# Patient Record
Sex: Male | Born: 1950 | Race: White | Hispanic: No | Marital: Married | Smoking: Never smoker
Health system: Southern US, Community
[De-identification: ages and names within clinical notes are randomized; demographics above are authoritative.]

## PROBLEM LIST (undated history)

## (undated) ENCOUNTER — Emergency Department (HOSPITAL_COMMUNITY): Admission: EM | Source: Home / Self Care

## (undated) DIAGNOSIS — E669 Obesity, unspecified: Secondary | ICD-10-CM

## (undated) DIAGNOSIS — E785 Hyperlipidemia, unspecified: Secondary | ICD-10-CM

## (undated) DIAGNOSIS — M1990 Unspecified osteoarthritis, unspecified site: Secondary | ICD-10-CM

## (undated) DIAGNOSIS — F32A Depression, unspecified: Secondary | ICD-10-CM

## (undated) DIAGNOSIS — E119 Type 2 diabetes mellitus without complications: Secondary | ICD-10-CM

## (undated) DIAGNOSIS — J189 Pneumonia, unspecified organism: Secondary | ICD-10-CM

## (undated) DIAGNOSIS — F419 Anxiety disorder, unspecified: Secondary | ICD-10-CM

## (undated) DIAGNOSIS — K219 Gastro-esophageal reflux disease without esophagitis: Secondary | ICD-10-CM

## (undated) DIAGNOSIS — G4730 Sleep apnea, unspecified: Secondary | ICD-10-CM

## (undated) DIAGNOSIS — I10 Essential (primary) hypertension: Secondary | ICD-10-CM

## (undated) DIAGNOSIS — M109 Gout, unspecified: Secondary | ICD-10-CM

## (undated) DIAGNOSIS — Z87442 Personal history of urinary calculi: Secondary | ICD-10-CM

## (undated) HISTORY — PX: FRACTURE SURGERY: SHX138

## (undated) HISTORY — PX: WISDOM TOOTH EXTRACTION: SHX21

## (undated) HISTORY — PX: GANGLION CYST EXCISION: SHX1691

---

## 2008-09-23 HISTORY — PX: FRACTURE SURGERY: SHX138

## 2016-02-02 ENCOUNTER — Encounter (HOSPITAL_COMMUNITY): Payer: Self-pay | Admitting: Emergency Medicine

## 2016-02-02 ENCOUNTER — Emergency Department (HOSPITAL_COMMUNITY): Payer: BLUE CROSS/BLUE SHIELD

## 2016-02-02 ENCOUNTER — Emergency Department (HOSPITAL_BASED_OUTPATIENT_CLINIC_OR_DEPARTMENT_OTHER): Payer: BLUE CROSS/BLUE SHIELD

## 2016-02-02 ENCOUNTER — Observation Stay (HOSPITAL_COMMUNITY)
Admission: EM | Admit: 2016-02-02 | Discharge: 2016-02-03 | Disposition: A | Discharge status: Home / Self Care | Payer: BLUE CROSS/BLUE SHIELD | Attending: Internal Medicine | Admitting: Internal Medicine

## 2016-02-02 DIAGNOSIS — Z79899 Other long term (current) drug therapy: Secondary | ICD-10-CM | POA: Diagnosis not present

## 2016-02-02 DIAGNOSIS — Z7982 Long term (current) use of aspirin: Secondary | ICD-10-CM | POA: Diagnosis not present

## 2016-02-02 DIAGNOSIS — R079 Chest pain, unspecified: Secondary | ICD-10-CM

## 2016-02-02 DIAGNOSIS — E669 Obesity, unspecified: Secondary | ICD-10-CM | POA: Insufficient documentation

## 2016-02-02 DIAGNOSIS — R0789 Other chest pain: Secondary | ICD-10-CM | POA: Diagnosis not present

## 2016-02-02 DIAGNOSIS — E119 Type 2 diabetes mellitus without complications: Secondary | ICD-10-CM | POA: Diagnosis not present

## 2016-02-02 DIAGNOSIS — G4730 Sleep apnea, unspecified: Secondary | ICD-10-CM | POA: Diagnosis present

## 2016-02-02 DIAGNOSIS — I10 Essential (primary) hypertension: Secondary | ICD-10-CM | POA: Diagnosis present

## 2016-02-02 DIAGNOSIS — E785 Hyperlipidemia, unspecified: Secondary | ICD-10-CM | POA: Diagnosis not present

## 2016-02-02 DIAGNOSIS — E1165 Type 2 diabetes mellitus with hyperglycemia: Secondary | ICD-10-CM | POA: Diagnosis present

## 2016-02-02 DIAGNOSIS — Z7984 Long term (current) use of oral hypoglycemic drugs: Secondary | ICD-10-CM | POA: Diagnosis not present

## 2016-02-02 DIAGNOSIS — IMO0002 Reserved for concepts with insufficient information to code with codable children: Secondary | ICD-10-CM | POA: Diagnosis present

## 2016-02-02 DIAGNOSIS — G4733 Obstructive sleep apnea (adult) (pediatric): Secondary | ICD-10-CM | POA: Diagnosis present

## 2016-02-02 HISTORY — DX: Sleep apnea, unspecified: G47.30

## 2016-02-02 HISTORY — DX: Type 2 diabetes mellitus without complications: E11.9

## 2016-02-02 HISTORY — DX: Essential (primary) hypertension: I10

## 2016-02-02 HISTORY — DX: Hyperlipidemia, unspecified: E78.5

## 2016-02-02 HISTORY — DX: Obesity, unspecified: E66.9

## 2016-02-02 LAB — BASIC METABOLIC PANEL
Anion gap: 7 (ref 5–15)
BUN: 28 mg/dL — AB (ref 6–20)
CO2: 27 mmol/L (ref 22–32)
Calcium: 9.2 mg/dL (ref 8.9–10.3)
Chloride: 104 mmol/L (ref 101–111)
Creatinine, Ser: 0.91 mg/dL (ref 0.61–1.24)
GFR calc Af Amer: >60 mL/min (ref >60)
GLUCOSE: 144 mg/dL — AB (ref 65–99)
POTASSIUM: 3.7 mmol/L (ref 3.5–5.1)
Sodium: 138 mmol/L (ref 135–145)

## 2016-02-02 LAB — CBC
HCT: 42.2 % (ref 39.0–52.0)
Hemoglobin: 13.7 g/dL (ref 13.0–17.0)
MCH: 30.3 pg (ref 26.0–34.0)
MCHC: 32.5 g/dL (ref 30.0–36.0)
MCV: 93.4 fL (ref 78.0–100.0)
Platelets: 177 10*3/uL (ref 150–400)
RBC: 4.52 MIL/uL (ref 4.22–5.81)
RDW: 13.3 % (ref 11.5–15.5)
WBC: 8 10*3/uL (ref 4.0–10.5)

## 2016-02-02 LAB — TROPONIN I: Troponin I: <0.03 ng/mL (ref <0.031)

## 2016-02-02 LAB — ECHOCARDIOGRAM COMPLETE
Height: 68 in
Weight: 4320 oz

## 2016-02-02 LAB — BRAIN NATRIURETIC PEPTIDE: B NATRIURETIC PEPTIDE 5: 9 pg/mL (ref 0.0–100.0)

## 2016-02-02 MED ORDER — NITROGLYCERIN 0.4 MG SL SUBL
SUBLINGUAL_TABLET | SUBLINGUAL | Status: AC
  Filled 2016-02-02: qty 1

## 2016-02-02 MED ORDER — ROSUVASTATIN CALCIUM 20 MG PO TABS
20.0000 mg | ORAL_TABLET | Freq: Every day | ORAL | Status: DC
  Administered 2016-02-02: 20 mg via ORAL
  Filled 2016-02-02 (×2): qty 1

## 2016-02-02 MED ORDER — OMEGA-3-ACID ETHYL ESTERS 1 G PO CAPS
1.0000 g | ORAL_CAPSULE | Freq: Two times a day (BID) | ORAL | Status: DC
  Administered 2016-02-02 – 2016-02-03 (×2): 1 g via ORAL
  Filled 2016-02-02 (×2): qty 1

## 2016-02-02 MED ORDER — ASPIRIN 81 MG PO CHEW
324.0000 mg | CHEWABLE_TABLET | Freq: Once | ORAL | Status: DC
  Filled 2016-02-02: qty 4

## 2016-02-02 MED ORDER — LISINOPRIL 10 MG PO TABS
40.0000 mg | ORAL_TABLET | Freq: Every day | ORAL | Status: DC
  Administered 2016-02-02 – 2016-02-03 (×2): 40 mg via ORAL
  Filled 2016-02-02 (×2): qty 4

## 2016-02-02 MED ORDER — SODIUM CHLORIDE 0.9% FLUSH
3.0000 mL | Freq: Two times a day (BID) | INTRAVENOUS | Status: DC
  Administered 2016-02-02 – 2016-02-03 (×3): 3 mL via INTRAVENOUS

## 2016-02-02 MED ORDER — ACETAMINOPHEN 325 MG PO TABS: 650.0000 mg | ORAL_TABLET | Freq: Four times a day (QID) | ORAL | Status: DC | PRN

## 2016-02-02 MED ORDER — HEPARIN SODIUM (PORCINE) 5000 UNIT/ML IJ SOLN
5000.0000 [IU] | Freq: Three times a day (TID) | INTRAMUSCULAR | Status: DC
  Administered 2016-02-02 – 2016-02-03 (×2): 5000 [IU] via SUBCUTANEOUS
  Filled 2016-02-02 (×2): qty 1

## 2016-02-02 MED ORDER — MORPHINE SULFATE (PF) 2 MG/ML IV SOLN: 2.0000 mg | INTRAVENOUS | Status: DC | PRN

## 2016-02-02 MED ORDER — SAW PALMETTO 450 MG PO CAPS: 1.0000 | ORAL_CAPSULE | Freq: Two times a day (BID) | ORAL | Status: DC

## 2016-02-02 MED ORDER — METFORMIN HCL 500 MG PO TABS
500.0000 mg | ORAL_TABLET | Freq: Two times a day (BID) | ORAL | Status: DC
  Administered 2016-02-03: 500 mg via ORAL
  Filled 2016-02-02: qty 1

## 2016-02-02 MED ORDER — ASPIRIN 81 MG PO CHEW
243.0000 mg | CHEWABLE_TABLET | Freq: Once | ORAL | Status: AC
  Administered 2016-02-02: 243 mg via ORAL

## 2016-02-02 MED ORDER — NITROGLYCERIN 0.4 MG SL SUBL
0.4000 mg | SUBLINGUAL_TABLET | Freq: Once | SUBLINGUAL | Status: AC
  Administered 2016-02-02: 0.4 mg via SUBLINGUAL

## 2016-02-02 MED ORDER — ASPIRIN EC 81 MG PO TBEC
81.0000 mg | DELAYED_RELEASE_TABLET | Freq: Two times a day (BID) | ORAL | Status: DC
  Administered 2016-02-02 – 2016-02-03 (×2): 81 mg via ORAL
  Filled 2016-02-02 (×2): qty 1

## 2016-02-02 MED ORDER — GI COCKTAIL ~~LOC~~
30.0000 mL | Freq: Once | ORAL | Status: AC
  Administered 2016-02-02: 30 mL via ORAL
  Filled 2016-02-02: qty 30

## 2016-02-02 MED ORDER — LINAGLIPTIN 5 MG PO TABS
5.0000 mg | ORAL_TABLET | Freq: Every day | ORAL | Status: DC
  Administered 2016-02-03: 5 mg via ORAL
  Filled 2016-02-02: qty 1

## 2016-02-02 MED ORDER — ACETAMINOPHEN 650 MG RE SUPP
650.0000 mg | Freq: Four times a day (QID) | RECTAL | Status: DC | PRN
Start: 2016-02-02 — End: 2016-02-03
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
## 2016-02-02 NOTE — ED Notes (Signed)
Pt up ambulatory to bathroom without difficulty.  

## 2016-02-02 NOTE — ED Provider Notes (Signed)
CSN: [id number]     Arrival date & time 02/02/16  4098 History   First MD Initiated Contact with Patient 02/02/16 [phone]     Chief Complaint  Patient presents with  . Chest Pain     (Consider location/radiation/quality/duration/timing/severity/associated sxs/prior Treatment) HPI Comments: Patient with history of hypertension, high cholesterol and diabetes presenting with central chest pain that woke him from sleep at 3 AM. Pain is intermittent, dull and achy. Last for several minutes at a time and comes and goes. Does not radiate to back shoulders or neck. Denies any shortness of breath, nausea, vomiting, diaphoresis. He has never had this pain before contrary to triage note. Does not feel like acid reflux. States he's been having fatigue and dyspnea on exertion for several weeks and saw his doctor. He had a Holter monitor and carotid Dopplers that he says were normal. He is scheduled for a stress test this coming Monday in 3 days. He reports his last stress test several years ago. Denies ever having a heart attack. He is not a smoker. Denies abdominal pain, nausea vomiting or back pain. Denies any headache or fever. Had episode of shortness of breath at rest 1 week ago while driving.  This lasted about 1 minute and there was no associated Chest pain, diaphoresis, nausea, or vomiting.   The history is provided by the patient.    Past Medical History  Diagnosis Date  . Diabetes mellitus without complication (HCC)   . Hypertension    Past Surgical History  Procedure Laterality Date  . Fracture surgery     History reviewed. No pertinent family history. Social History  Substance Use Topics  . Smoking status: Never Smoker   . Smokeless tobacco: None  . Alcohol Use: No    Review of Systems  Constitutional: Positive for activity change. Negative for fever, diaphoresis and fatigue.  HENT: Negative for congestion.   Eyes: Negative for visual disturbance.  Respiratory: Negative for cough and  shortness of breath.   Cardiovascular: Positive for chest pain.  Gastrointestinal: Negative for nausea, vomiting and abdominal pain.  Genitourinary: Negative for dysuria, urgency and hematuria.  Musculoskeletal: Negative for myalgias and arthralgias.  Skin: Negative for rash.  Neurological: Negative for dizziness, weakness, light-headedness and headaches.  A complete 10 system review of systems was obtained and all systems are negative except as noted in the HPI and PMH.      Allergies  Review of patient's allergies indicates no known allergies.  Home Medications   Prior to Admission medications   Not on File   BP 180/90 mmHg  Pulse 79  Temp(Src) 98.5 F (36.9 C) (Oral)  Resp 20  Ht  (1.727 m)  Wt 270 lb (122.471 kg)  BMI 41.06 kg/m2  SpO2 94% Physical Exam  Constitutional: He is oriented to person, place, and time. He appears well-developed and well-nourished. No distress.  obese  HENT:  Head: Normocephalic and atraumatic.  Mouth/Throat: Oropharynx is clear and moist. No oropharyngeal exudate.  Eyes: Conjunctivae and EOM are normal. Pupils are equal, round, and reactive to light.  Neck: Normal range of motion. Neck supple.  No meningismus.  Cardiovascular: Normal rate, regular rhythm, normal heart sounds and intact distal pulses.   No murmur heard. Pulmonary/Chest: Effort normal and breath sounds normal. No respiratory distress.  Abdominal: Soft. There is no tenderness. There is no rebound and no guarding.  Musculoskeletal: Normal range of motion. He exhibits no edema or tenderness.  Neurological: He is alert and oriented  to person, place, and time. No cranial nerve deficit. He exhibits normal muscle tone. Coordination normal.  No ataxia on finger to nose bilaterally. No pronator drift. 5/5 strength throughout. CN 2-12 intact.Equal grip strength. Sensation intact.   Skin: Skin is warm.  Psychiatric: He has a normal mood and affect. His behavior is normal.  Nursing  note and vitals reviewed.   ED Course  Procedures (including critical care time) Labs Review Labs Reviewed  BASIC METABOLIC PANEL - Abnormal; Notable for the following:    Glucose, Bld 144 (*)    BUN 28 (*)    All other components within normal limits  CBC  TROPONIN I  BRAIN NATRIURETIC PEPTIDE  TROPONIN I  TROPONIN I  TROPONIN I  TROPONIN I    Imaging Review Dg Chest 2 View  02/02/2016  CLINICAL DATA:  Central chest pain for several hours EXAM: CHEST  2 VIEW COMPARISON:  None FINDINGS: Cardiac shadow is within normal limits. Lungs are clear bilaterally. No acute bony abnormality is seen. Previous surgical fixation of the left clavicle is noted. IMPRESSION: No active cardiopulmonary disease. Electronically Signed   By: Alcide CleverMark  Lukens M.D.   On: 02/02/2016 08:13   I have personally reviewed and evaluated these images and lab results as part of my medical decision-making.   EKG Interpretation   Date/Time:  Friday Feb 02 2016 07:22:56 EDT Ventricular Rate:  80 PR Interval:  220 QRS Duration: 85 QT Interval:  353 QTC Calculation: 407 R Axis:   28 Text Interpretation:  Sinus rhythm Prolonged PR interval wandering  baseline No previous ECGs available Confirmed by Manus GunningANCOUR  MD, Jaziya Obarr  [phone]) on 02/02/2016 7:40:31 AM      MDM   Final diagnoses:  Chest pain, unspecified chest pain type   Central chest pain since 3 am, waxing and waning.  Pain free at this time.  EKG without acute ST abnormalities.  ASA and GI cocktail given  HEART score 5.  Troponin negative. Chest x-ray negative. Patient still has intermittent pain on and off throughout his stay in the ED. Discussed with Dr. Wyline MoodBranch of cardiology. He recommends a medical admission and has seen patient. He has outpatient stress test scheduled next week.  Admission dw Dr. Conley RollsLe.  Glynn OctaveStephen Marris Frontera, MD 02/02/16 [phone]
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
## 2016-02-02 NOTE — ED Notes (Signed)
Pt states he has been having chest pain off and on for a few weeks which his doctor has been following and has ordered testing to evaluate.  Was awoken by throbbing chest pain in center of chest with shortness of breath.

## 2016-02-03 DIAGNOSIS — R079 Chest pain, unspecified: Secondary | ICD-10-CM | POA: Diagnosis not present

## 2016-02-03 DIAGNOSIS — G4730 Sleep apnea, unspecified: Secondary | ICD-10-CM

## 2016-02-03 DIAGNOSIS — E669 Obesity, unspecified: Secondary | ICD-10-CM | POA: Diagnosis not present

## 2016-02-03 LAB — TROPONIN I

## 2016-02-03 MED ORDER — LOSARTAN POTASSIUM 50 MG PO TABS: 50.0000 mg | ORAL_TABLET | Freq: Every day | ORAL | Status: DC

## 2016-02-03 MED ORDER — PANTOPRAZOLE SODIUM 40 MG PO TBEC: 40.0000 mg | DELAYED_RELEASE_TABLET | Freq: Every day | ORAL | Status: DC

## 2016-02-03 MED ORDER — PANTOPRAZOLE SODIUM 40 MG PO TBEC: 40.0000 mg | DELAYED_RELEASE_TABLET | Freq: Two times a day (BID) | ORAL | Status: DC

## 2016-02-03 NOTE — Plan of Care (Signed)
Problem: Phase II Progression Outcomes Goal: Anginal pain relieved Outcome: Progressing Denies any CP at this time. Troponin  Within normal limits

## 2016-02-03 NOTE — Discharge Summary (Signed)
Physician Discharge Summary  Noah Hampton JYN:[id number] DOB: 10-10-1950 DOA: 02/02/2016  PCP: No primary care provider on file.  Admit date: 02/02/2016 Discharge date: 02/03/2016  Time spent: 35 minutes  Recommendations for Outpatient Follow-up:  1. Follow up with our cardiologist for the stress test Monday.  2.         Follow up with your PCP as scheduled.   Discharge Diagnoses:  Principal Problem:   Chest pain with high risk of acute coronary syndrome Active Problems:   Uncontrolled hypertension   Type 2 diabetes mellitus, uncontrolled (HCC)   Dyslipidemia   Obesity-BMI 41   Sleep apnea-on C-pap   Chest pain   Atypical chest pain   Discharge Condition: improved.  No further chest pain.   Diet recommendation: Cardiac and carb modified diet.   Filed Weights   02/02/16 0724  Weight: 122.471 kg (270 lb)    History of present illness: Patient was admitted with atypical CP for r/out by me on Feb 02, 2016.  As per my prior H and P:  " Noah Hampton is an 65 y.o. male with hx of sleep apnea, HTN, DM, HLD, presented to the ER with feeling fatigue and was to have his stress test on Monday having awaken today at 3am with retrosternal CP. He did not have diaphoresis, but had some SOB. Evaluation in the ER included an EKG and troponin x 2 and were negative. His ECHO was normal as well. Cardiology was consulted, and saw him and recommended 24 hour obs and serial troponins. Hospitalist was asked to admit him for r/out. He is currently pain free. He doesn't consume alcohol, and doesn't smoke.    Hospital Course:  Patient no longer had CP after being admitted into the hospital.  His meds were continued, and his troponins were cycled.  They were all negative.  He complained that he has had chronic tickling non productive coughs, and admitted to having reflux symptomology.  Since he has been on Lisinopril, I will change him to Cozaar at 50mg  per day, in case he has suffered ACE I induced  cough.  He also will be placed on Protonix 40mg  BID take take at least 4 to 6 weeks, in case his cough is from his reflux.  He is anxious to go home, and will be discharged home today.  He was strongly encouraged to follow up with his PCP, and to proceed with his stress test.  Thank you and Good Day.   Procedures: ECHO.   Normal.   Consultations:   Cardiology:  Dr Wyline MoodBranch.  Discharge Exam: Filed Vitals:   02/02/16 2100 02/03/16 0620  BP: 163/76 178/85  Pulse: 79 65  Temp: 97.6 F (36.4 C)   Resp: 17 20   Discharge Instructions   Discharge Instructions    Diet - low sodium heart healthy    Complete by:  As directed      Discharge instructions    Complete by:  As directed   Follow up with your doctor and proceed with your stress test.  Take your medicine as prescribed.     Increase activity slowly    Complete by:  As directed           Current Discharge Medication List    START taking these medications   Details  losartan (COZAAR) 50 MG tablet Take 1 tablet (50 mg total) by mouth daily. Qty: 30 tablet, Refills: 2    pantoprazole (PROTONIX) 40 MG tablet Take 1 tablet (40  mg total) by mouth 2 (two) times daily. Qty: 60 tablet, Refills: 2      CONTINUE these medications which have NOT CHANGED   Details  aspirin EC 81 MG tablet Take 81 mg by mouth 2 (two) times daily.    hydrochlorothiazide (HYDRODIURIL) 25 MG tablet Take 25 mg by mouth daily.    metFORMIN (GLUCOPHAGE) 500 MG tablet Take 500 mg by mouth 2 (two) times daily with a meal.    Multiple Vitamin (MULTIVITAMIN WITH MINERALS) TABS tablet Take 1 tablet by mouth daily.    Omega-3 Fatty Acids (FISH OIL) 1200 MG CAPS Take 2 capsules by mouth 2 (two) times daily.    rosuvastatin (CRESTOR) 20 MG tablet Take 20 mg by mouth at bedtime.    Saw Palmetto 450 MG CAPS Take 1 capsule by mouth 2 (two) times daily.    sitaGLIPtin (JANUVIA) 100 MG tablet Take 100 mg by mouth daily.      STOP taking these medications      lisinopril (PRINIVIL,ZESTRIL) 40 MG tablet      naproxen sodium (ANAPROX) 220 MG tablet        No Known Allergies    The results of significant diagnostics from this hospitalization (including imaging, microbiology, ancillary and laboratory) are listed below for reference.    Significant Diagnostic Studies: Dg Chest 2 View  02/02/2016  CLINICAL DATA:  Central chest pain for several hours EXAM: CHEST  2 VIEW COMPARISON:  None FINDINGS: Cardiac shadow is within normal limits. Lungs are clear bilaterally. No acute bony abnormality is seen. Previous surgical fixation of the left clavicle is noted. IMPRESSION: No active cardiopulmonary disease. Electronically Signed   By: Alcide Clever M.D.   On: 02/02/2016 08:13    Labs: Basic Metabolic Panel:  Recent Labs Lab 02/02/16 0728  NA 138  K 3.7  CL 104  CO2 27  GLUCOSE 144*  BUN 28*  CREATININE 0.91  CALCIUM 9.2   Liver Function Tests:  CBC:  Recent Labs Lab 02/02/16 0728  WBC 8.0  HGB 13.7  HCT 42.2  MCV 93.4  PLT 177   Cardiac Enzymes:  Recent Labs Lab 02/02/16 0728 02/02/16 1013 02/02/16 1456 02/02/16 2035 02/03/16 0315  TROPONINI <0.03 <0.03 <0.03 <0.03 <0.03   BNP: BNP (last 3 results)  Recent Labs  02/02/16 0729  BNP 9.0    Signed:  Cherokee Boccio MD.  [facility] 02/03/2016, 12:00 PM

## 2016-02-03 NOTE — Progress Notes (Signed)
Patient received discharge instructions along with prescriptions and follow up appointments. Patient and wife verbalized understanding of all instructions. Patient was escorted by staff via wheelchair to vehicle . Patient discharged to home in stable condition.

## 2016-02-03 NOTE — Plan of Care (Signed)
Problem: Phase II Progression Outcomes Goal: Stress Test if indicated Outcome: Progressing Having a stress test on 02/08/16 outpatient per patient

## 2016-02-03 NOTE — Plan of Care (Signed)
Problem: Phase II Progression Outcomes Goal: Stress Test if indicated Outcome: Progressing Patient having stress test on Monday May 15

## 2016-11-13 DIAGNOSIS — M1990 Unspecified osteoarthritis, unspecified site: Secondary | ICD-10-CM | POA: Insufficient documentation

## 2016-11-13 DIAGNOSIS — R0902 Hypoxemia: Secondary | ICD-10-CM | POA: Insufficient documentation

## 2017-11-14 ENCOUNTER — Other Ambulatory Visit: Payer: Self-pay

## 2017-11-14 ENCOUNTER — Emergency Department (HOSPITAL_COMMUNITY)
Admission: EM | Admit: 2017-11-14 | Discharge: 2017-11-14 | Disposition: A | Discharge status: Home / Self Care | Payer: BLUE CROSS/BLUE SHIELD | Attending: Emergency Medicine | Admitting: Emergency Medicine

## 2017-11-14 ENCOUNTER — Encounter (HOSPITAL_COMMUNITY): Payer: Self-pay | Admitting: Emergency Medicine

## 2017-11-14 ENCOUNTER — Emergency Department (HOSPITAL_COMMUNITY): Payer: BLUE CROSS/BLUE SHIELD

## 2017-11-14 DIAGNOSIS — L03116 Cellulitis of left lower limb: Secondary | ICD-10-CM | POA: Diagnosis not present

## 2017-11-14 DIAGNOSIS — Z79899 Other long term (current) drug therapy: Secondary | ICD-10-CM | POA: Diagnosis not present

## 2017-11-14 DIAGNOSIS — Z7982 Long term (current) use of aspirin: Secondary | ICD-10-CM | POA: Diagnosis not present

## 2017-11-14 DIAGNOSIS — Z7984 Long term (current) use of oral hypoglycemic drugs: Secondary | ICD-10-CM | POA: Insufficient documentation

## 2017-11-14 DIAGNOSIS — M79605 Pain in left leg: Secondary | ICD-10-CM | POA: Diagnosis present

## 2017-11-14 DIAGNOSIS — I10 Essential (primary) hypertension: Secondary | ICD-10-CM | POA: Diagnosis not present

## 2017-11-14 DIAGNOSIS — E119 Type 2 diabetes mellitus without complications: Secondary | ICD-10-CM | POA: Insufficient documentation

## 2017-11-14 LAB — CBC WITH DIFFERENTIAL/PLATELET
Basophils Absolute: 0 10*3/uL (ref 0.0–0.1)
Basophils Relative: 0 %
EOS ABS: 0.2 10*3/uL (ref 0.0–0.7)
Eosinophils Relative: 2 %
HCT: 40.9 % (ref 39.0–52.0)
HEMOGLOBIN: 12.7 g/dL — AB (ref 13.0–17.0)
LYMPHS ABS: 1.8 10*3/uL (ref 0.7–4.0)
Lymphocytes Relative: 15 %
MCH: 29.4 pg (ref 26.0–34.0)
MCHC: 31.1 g/dL (ref 30.0–36.0)
MCV: 94.7 fL (ref 78.0–100.0)
Monocytes Absolute: 1.5 10*3/uL — ABNORMAL HIGH (ref 0.1–1.0)
Monocytes Relative: 12 %
NEUTROS ABS: 8.6 10*3/uL — AB (ref 1.7–7.7)
NEUTROS PCT: 71 %
Platelets: 172 10*3/uL (ref 150–400)
RBC: 4.32 MIL/uL (ref 4.22–5.81)
RDW: 13.3 % (ref 11.5–15.5)
WBC: 12.1 10*3/uL — AB (ref 4.0–10.5)

## 2017-11-14 LAB — BASIC METABOLIC PANEL
Anion gap: 12 (ref 5–15)
BUN: 21 mg/dL — AB (ref 6–20)
CHLORIDE: 100 mmol/L — AB (ref 101–111)
CO2: 28 mmol/L (ref 22–32)
Calcium: 9.5 mg/dL (ref 8.9–10.3)
Creatinine, Ser: 0.94 mg/dL (ref 0.61–1.24)
GFR calc non Af Amer: >60 mL/min (ref >60)
Glucose, Bld: 141 mg/dL — ABNORMAL HIGH (ref 65–99)
POTASSIUM: 3.7 mmol/L (ref 3.5–5.1)
SODIUM: 140 mmol/L (ref 135–145)

## 2017-11-14 LAB — CBG MONITORING, ED: Glucose-Capillary: 143 mg/dL — ABNORMAL HIGH (ref 65–99)

## 2017-11-14 MED ORDER — CEPHALEXIN 500 MG PO CAPS: 500.0000 mg | ORAL_CAPSULE | Freq: Four times a day (QID) | ORAL | 0 refills | Status: DC

## 2017-11-14 NOTE — ED Triage Notes (Signed)
Redness and swelling noted to left lower leg. Denies any increased SOB. Nad.

## 2017-11-14 NOTE — ED Notes (Signed)
Pt.'s CBG 143
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
## 2019-01-07 IMAGING — US US EXTREM LOW VENOUS*L*
1 series · 14 of 24 positions shown · non-contrast
Comparison: None

CLINICAL DATA: Swelling, pain, redness

EXAM:
LEFT LOWER EXTREMITY VENOUS DOPPLER ULTRASOUND
TECHNIQUE: Gray-scale sonography with compression, as well as color and duplex
ultrasound, were performed to evaluate the deep venous system from
the level of the common femoral vein through the popliteal and
proximal calf veins.

[Series 1: us extrem low venous*left* · 0.09mm/px · 14 of 37 slices shown]
[im 1/37]
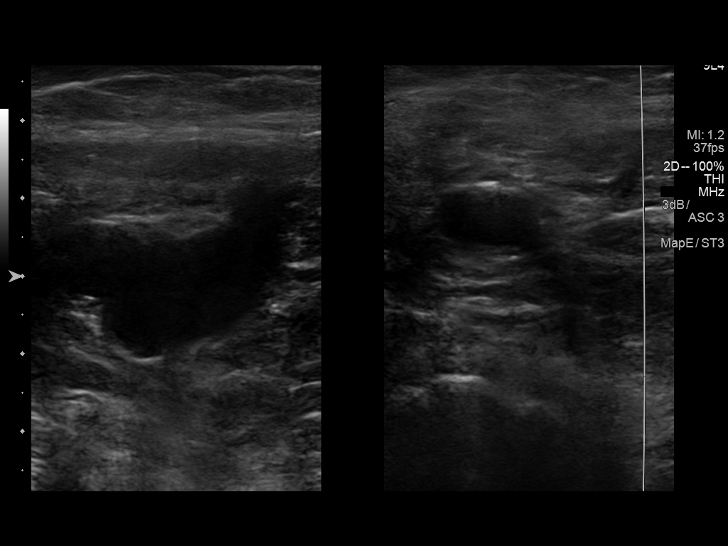
[im 4/37]
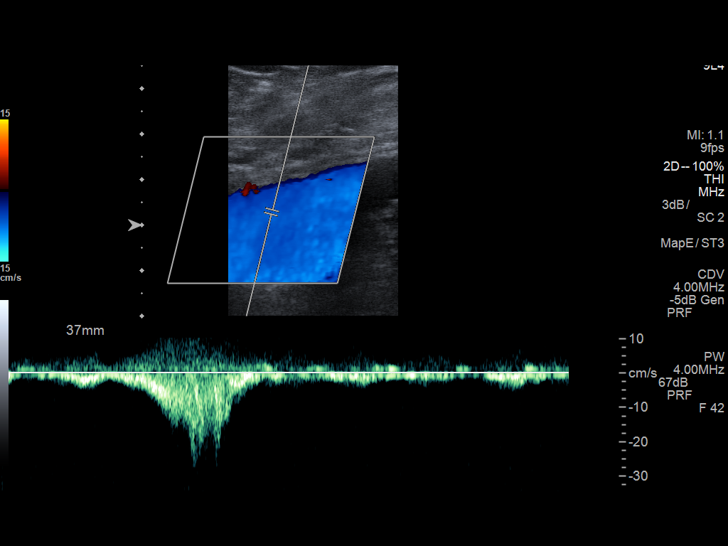
[im 7/37]
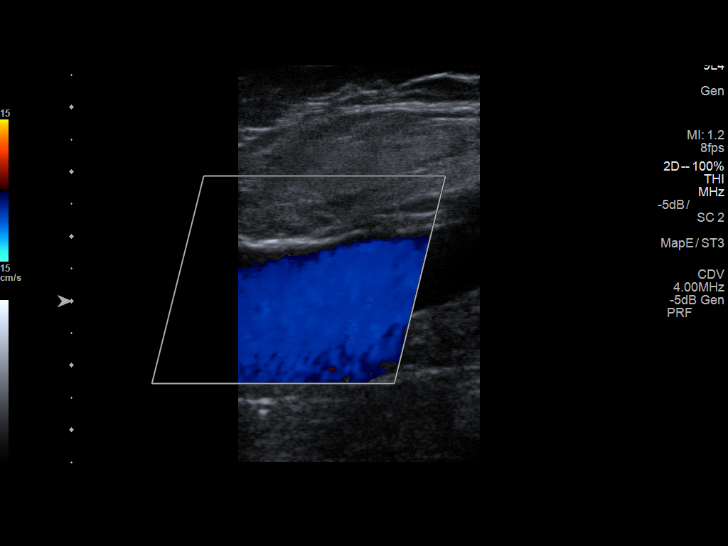
[im 10/37]
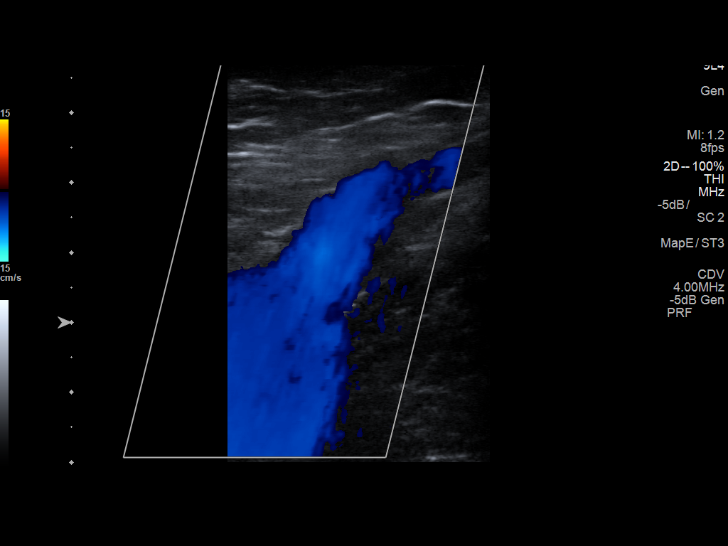
[im 11/37]
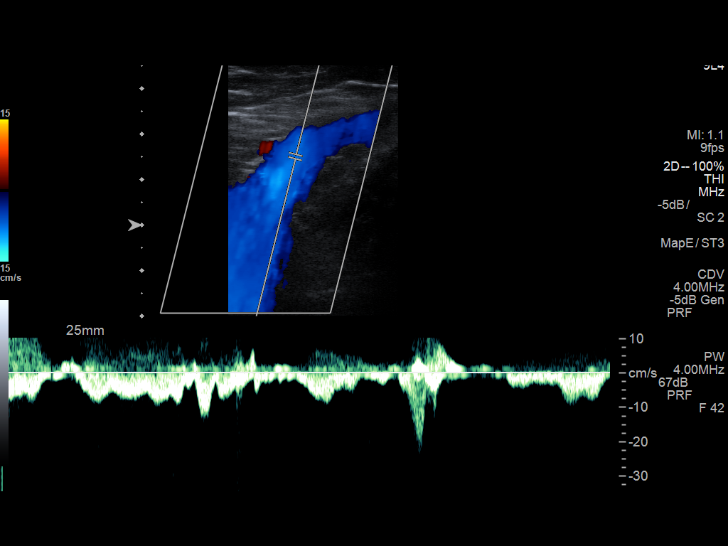
[im 15/37]
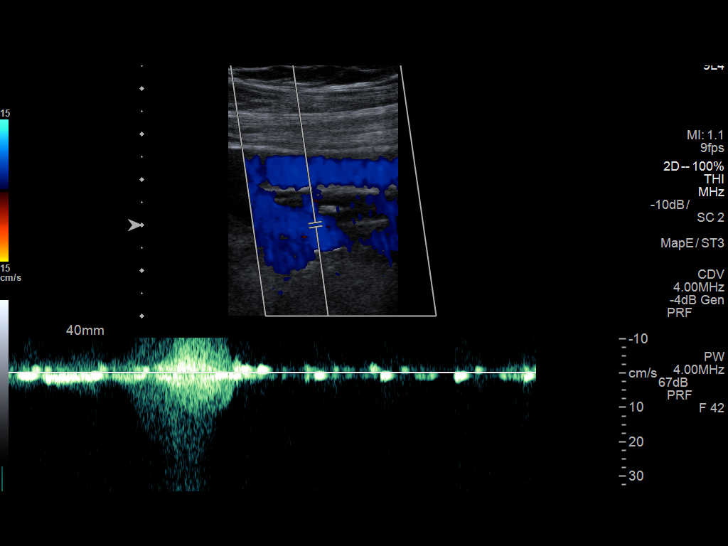
[im 18/37]
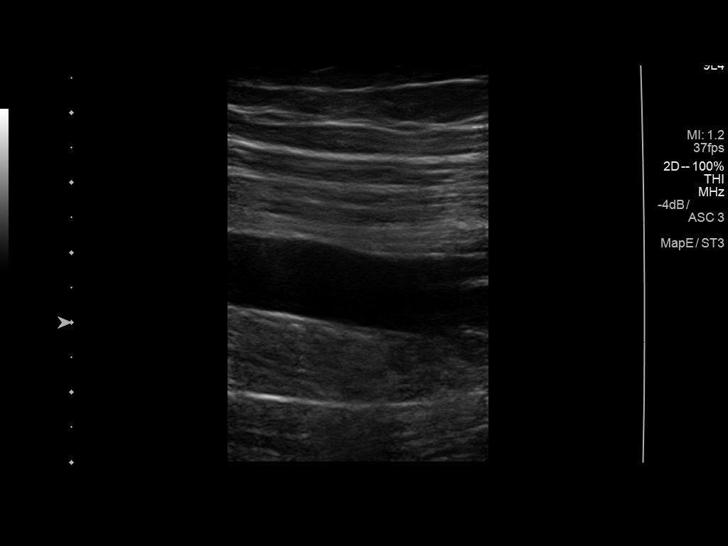
[im 19/37]
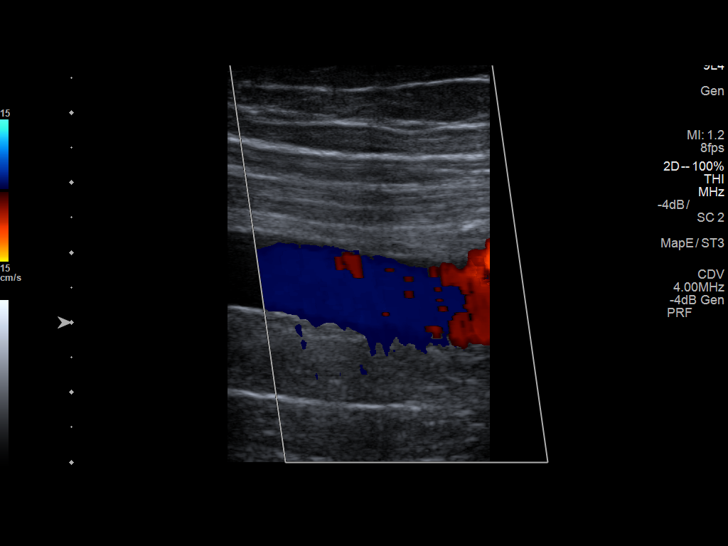
[im 22/37]
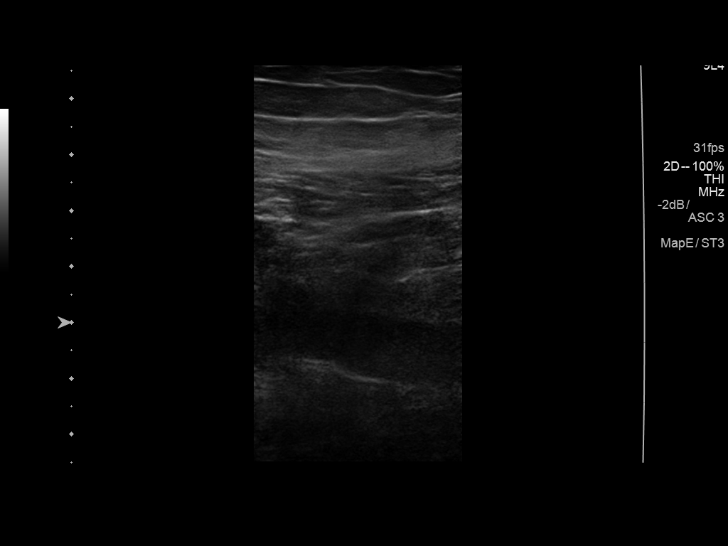
[im 26/37]
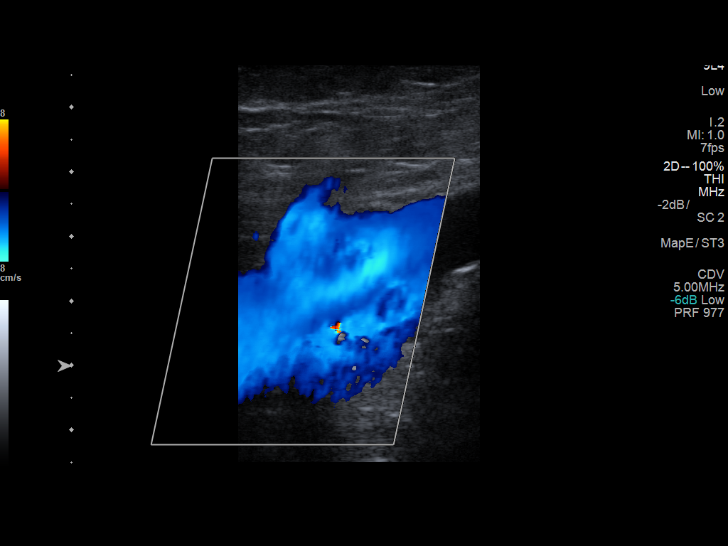
[im 29/37]
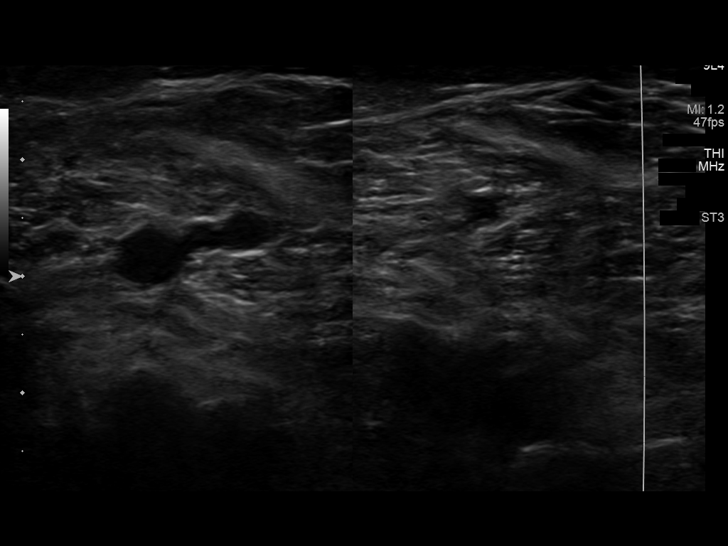
[im 30/37]
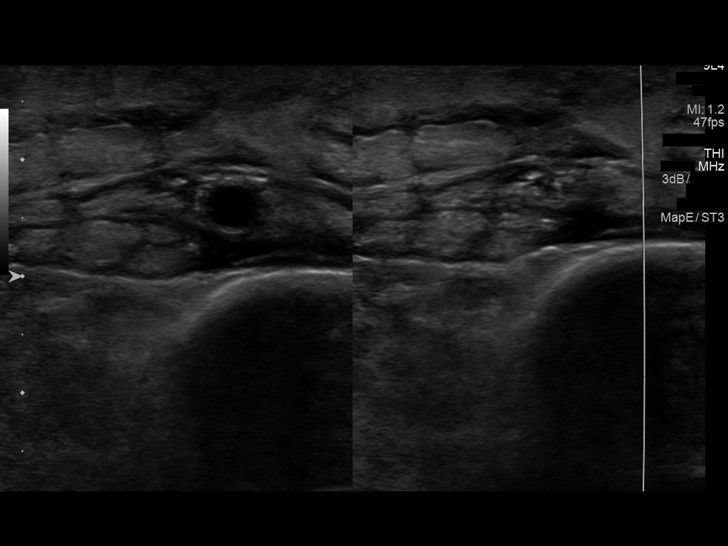
[im 33/37]
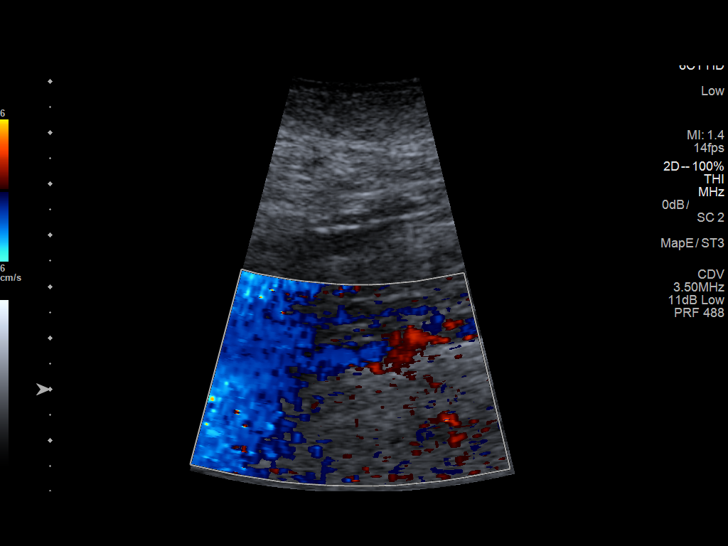
[im 37/37]
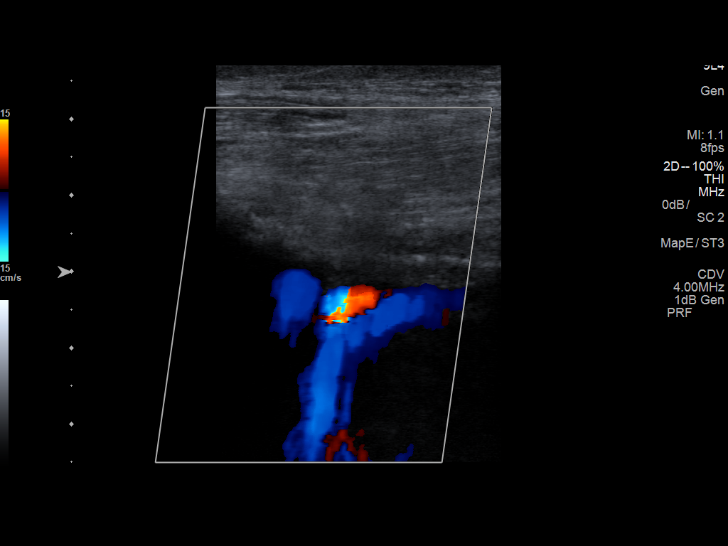

[14 of 24 positions shown; findings below may reference images not displayed]

FINDINGS: Normal compressibility of the common femoral, superficial femoral,
and popliteal veins, as well as the proximal calf veins. No filling
defects to suggest DVT on grayscale or color Doppler imaging.
Doppler waveforms show normal direction of venous flow, normal
respiratory phasicity and response to augmentation. Survey views of
the contralateral common femoral vein are unremarkable.
IMPRESSION: No evidence of LEFT lower extremity deep vein thrombosis.

## 2019-08-03 DIAGNOSIS — R1011 Right upper quadrant pain: Secondary | ICD-10-CM | POA: Insufficient documentation

## 2020-02-10 DIAGNOSIS — R7989 Other specified abnormal findings of blood chemistry: Secondary | ICD-10-CM | POA: Insufficient documentation

## 2020-06-18 DIAGNOSIS — K859 Acute pancreatitis without necrosis or infection, unspecified: Secondary | ICD-10-CM | POA: Insufficient documentation

## 2020-06-20 DIAGNOSIS — R42 Dizziness and giddiness: Secondary | ICD-10-CM | POA: Insufficient documentation

## 2020-10-22 DIAGNOSIS — R350 Frequency of micturition: Secondary | ICD-10-CM | POA: Insufficient documentation

## 2020-10-22 DIAGNOSIS — R55 Syncope and collapse: Secondary | ICD-10-CM | POA: Insufficient documentation

## 2020-10-22 DIAGNOSIS — F331 Major depressive disorder, recurrent, moderate: Secondary | ICD-10-CM | POA: Insufficient documentation

## 2020-10-24 DIAGNOSIS — K760 Fatty (change of) liver, not elsewhere classified: Secondary | ICD-10-CM | POA: Insufficient documentation

## 2020-10-24 DIAGNOSIS — Z8719 Personal history of other diseases of the digestive system: Secondary | ICD-10-CM | POA: Insufficient documentation

## 2021-01-27 DIAGNOSIS — M79671 Pain in right foot: Secondary | ICD-10-CM | POA: Insufficient documentation

## 2021-08-26 DIAGNOSIS — M545 Low back pain, unspecified: Secondary | ICD-10-CM | POA: Insufficient documentation

## 2021-10-15 ENCOUNTER — Other Ambulatory Visit (HOSPITAL_COMMUNITY): Payer: Self-pay | Admitting: Physician Assistant

## 2021-10-15 ENCOUNTER — Other Ambulatory Visit: Payer: Self-pay | Admitting: Physician Assistant

## 2021-10-15 DIAGNOSIS — Z86711 Personal history of pulmonary embolism: Secondary | ICD-10-CM

## 2021-10-15 DIAGNOSIS — R0600 Dyspnea, unspecified: Secondary | ICD-10-CM

## 2021-11-08 ENCOUNTER — Other Ambulatory Visit: Payer: Self-pay | Admitting: Physician Assistant

## 2021-11-08 ENCOUNTER — Other Ambulatory Visit (HOSPITAL_COMMUNITY): Payer: Self-pay | Admitting: Physician Assistant

## 2021-11-08 DIAGNOSIS — R1900 Intra-abdominal and pelvic swelling, mass and lump, unspecified site: Secondary | ICD-10-CM

## 2021-11-09 ENCOUNTER — Ambulatory Visit (HOSPITAL_COMMUNITY): Payer: BLUE CROSS/BLUE SHIELD

## 2021-11-12 ENCOUNTER — Encounter (HOSPITAL_COMMUNITY): Payer: Self-pay

## 2021-11-12 ENCOUNTER — Ambulatory Visit (HOSPITAL_COMMUNITY): Payer: BC Managed Care – PPO

## 2021-11-12 ENCOUNTER — Ambulatory Visit (HOSPITAL_COMMUNITY): Admission: RE | Admit: 2021-11-12 | Payer: BC Managed Care – PPO | Source: Ambulatory Visit

## 2021-11-29 ENCOUNTER — Other Ambulatory Visit (HOSPITAL_COMMUNITY): Payer: Self-pay | Admitting: Physician Assistant

## 2021-11-29 DIAGNOSIS — K861 Other chronic pancreatitis: Secondary | ICD-10-CM

## 2021-11-29 DIAGNOSIS — R109 Unspecified abdominal pain: Secondary | ICD-10-CM

## 2022-03-07 NOTE — Patient Instructions (Addendum)
DUE TO COVID-19 ONLY TWO VISITORS  (aged 72 and older)  ARE ALLOWED TO COME WITH YOU AND STAY IN THE WAITING ROOM ONLY DURING PRE OP AND PROCEDURE.   **NO VISITORS ARE ALLOWED IN THE SHORT STAY AREA OR RECOVERY ROOM!!**  IF YOU WILL BE ADMITTED INTO THE HOSPITAL YOU ARE ALLOWED ONLY FOUR SUPPORT PEOPLE DURING VISITATION HOURS ONLY (7 AM -8PM)   The support person(s) must pass our screening, gel in and out, and wear a mask at all times, including in the patient's room. Patients must also wear a mask when staff or their support person are in the room. Visitors GUEST BADGE MUST BE WORN VISIBLY  One adult visitor may remain with you overnight and MUST be in the room by 8 P.M.     Your procedure is scheduled on: 03/19/22   Report to [facility] Main Entrance    Report to admitting at 10:30 AM   Call this number if you have problems the morning of surgery [phone]   Do not eat food :After Midnight.   After Midnight you may have the following liquids until 9:55 AM DAY OF SURGERY  Water Black Coffee (sugar ok, NO MILK/CREAM OR CREAMERS)  Tea (sugar ok, NO MILK/CREAM OR CREAMERS) regular and decaf                             Plain Jell-O (NO RED)                                           Fruit ices (not with fruit pulp, NO RED)                                     Popsicles (NO RED)                                                                  Juice: apple, WHITE grape, WHITE cranberry Sports drinks like Gatorade (NO RED) Clear broth(vegetable,chicken,beef)    The day of surgery:  Drink ONE (1) Pre-Surgery G2 at 9:55 AM the morning of surgery. Drink in one sitting. Do not sip.  This drink was given to you during your hospital  pre-op appointment visit. Nothing else to drink after completing the  Pre-Surgery G2.          If you have questions, please contact your surgeon's office.   FOLLOW BOWEL PREP AND ANY ADDITIONAL PRE OP INSTRUCTIONS YOU RECEIVED FROM YOUR SURGEON'S  OFFICE!!!     Oral Hygiene is also important to reduce your risk of infection.                                    Remember - BRUSH YOUR TEETH THE MORNING OF SURGERY WITH YOUR REGULAR TOOTHPASTE   Take these medicines the morning of surgery with A SIP OF WATER: Inhalers, Allopurinol, Lexapro  DO NOT TAKE ANY ORAL DIABETIC MEDICATIONS DAY OF YOUR SURGERY  How to Manage Your Diabetes Before and After Surgery  Why is it important to control my blood sugar before and after surgery? Improving blood sugar levels before and after surgery helps healing and can limit problems. A way of improving blood sugar control is eating a healthy diet by:  Eating less sugar and carbohydrates  Increasing activity/exercise  Talking with your doctor about reaching your blood sugar goals High blood sugars (greater than 180 mg/dL) can raise your risk of infections and slow your recovery, so you will need to focus on controlling your diabetes during the weeks before surgery. Make sure that the doctor who takes care of your diabetes knows about your planned surgery including the date and location.  How do I manage my blood sugar before surgery? Check your blood sugar at least 4 times a day, starting 2 days before surgery, to make sure that the level is not too high or low. Check your blood sugar the morning of your surgery when you wake up and every 2 hours until you get to the Short Stay unit. If your blood sugar is less than 70 mg/dL, you will need to treat for low blood sugar: Do not take insulin. Treat a low blood sugar (less than 70 mg/dL) with  cup of clear juice (cranberry or apple), 4 glucose tablets, OR glucose gel. Recheck blood sugar in 15 minutes after treatment (to make sure it is greater than 70 mg/dL). If your blood sugar is not greater than 70 mg/dL on recheck, call [phone] for further instructions. Report your blood sugar to the short stay nurse when you get to Short Stay.  If you are  admitted to the hospital after surgery: Your blood sugar will be checked by the staff and you will probably be given insulin after surgery (instead of oral diabetes medicines) to make sure you have good blood sugar levels. The goal for blood sugar control after surgery is 80-180 mg/dL.   WHAT DO I DO ABOUT MY DIABETES MEDICATION?  Do not take oral diabetes medicines (pills) the morning of surgery.  THE DAY BEFORE SURGERY, take morning dose of Glimepiride, no evening dose. Do not take [place]. Take ACTOS as prescribed       THE MORNING OF SURGERY, do not take Glimepiride, Marcelline Deist, or ACTOS   Reviewed and Endorsed by [facility] Patient Education Committee, August 2015   Bring CPAP mask and tubing day of surgery.                              You may not have any metal on your body including jewelry, and body piercing             Do not wear lotions, powders, cologne, or deodorant              Men may shave face and neck.   Do not bring valuables to the hospital. [place] IS NOT             RESPONSIBLE   FOR VALUABLES.   Bring small overnight bag day of surgery.   DO NOT BRING YOUR HOME MEDICATIONS TO THE HOSPITAL. PHARMACY WILL DISPENSE MEDICATIONS LISTED ON YOUR MEDICATION LIST TO YOU DURING YOUR ADMISSION IN THE HOSPITAL!              Please read over the following fact sheets you were given: IF YOU HAVE QUESTIONS ABOUT YOUR PRE-OP INSTRUCTIONS PLEASE CALL [phone]-  [facility] - Preparing for Surgery Before surgery, you can play an important role.  Because skin is not sterile, your skin needs to be as free of germs as possible.  You can reduce the number of germs on your skin by washing with CHG (chlorahexidine gluconate) soap before surgery.  CHG is an antiseptic cleaner which kills germs and bonds with the skin to continue killing germs even after washing. Please DO NOT use if you have an allergy to CHG or antibacterial soaps.  If your skin becomes  reddened/irritated stop using the CHG and inform your nurse when you arrive at Short Stay. Do not shave (including legs and underarms) for at least 48 hours prior to the first CHG shower.  You may shave your face/neck.  Please follow these instructions carefully:  1.  Shower with CHG Soap the night before surgery and the  morning of surgery.  2.  If you choose to wash your hair, wash your hair first as usual with your normal  shampoo.  3.  After you shampoo, rinse your hair and body thoroughly to remove the shampoo.                             4.  Use CHG as you would any other liquid soap.  You can apply chg directly to the skin and wash.  Gently with a scrungie or clean washcloth.  5.  Apply the CHG Soap to your body ONLY FROM THE NECK DOWN.   Do   not use on face/ open                           Wound or open sores. Avoid contact with eyes, ears mouth and   genitals (private parts).                       Wash face,  Genitals (private parts) with your normal soap.             6.  Wash thoroughly, paying special attention to the area where your    surgery  will be performed.  7.  Thoroughly rinse your body with warm water from the neck down.  8.  DO NOT shower/wash with your normal soap after using and rinsing off the CHG Soap.                9.  Pat yourself dry with a clean towel.            10.  Wear clean pajamas.            11.  Place clean sheets on your bed the night of your first shower and do not  sleep with pets. Day of Surgery : Do not apply any lotions/deodorants the morning of surgery.  Please wear clean clothes to the hospital/surgery center.  FAILURE TO FOLLOW THESE INSTRUCTIONS MAY RESULT IN THE CANCELLATION OF YOUR SURGERY  PATIENT SIGNATURE_________________________________  NURSE SIGNATURE__________________________________  ________________________________________________________________________       Noah Hampton  An incentive spirometer is a tool that can  help keep your lungs clear and active. This tool measures how well you are filling your lungs with each breath. Taking long deep breaths may help reverse or decrease the chance of developing breathing (pulmonary) problems (especially infection) following: A long period of time when you are unable to move or be  active. BEFORE THE PROCEDURE  If the spirometer includes an indicator to show your best effort, your nurse or respiratory therapist will set it to a desired goal. If possible, sit up straight or lean slightly forward. Try not to slouch. Hold the incentive spirometer in an upright position. INSTRUCTIONS FOR USE  Sit on the edge of your bed if possible, or sit up as far as you can in bed or on a chair. Hold the incentive spirometer in an upright position. Breathe out normally. Place the mouthpiece in your mouth and seal your lips tightly around it. Breathe in slowly and as deeply as possible, raising the piston or the ball toward the top of the column. Hold your breath for 3-5 seconds or for as long as possible. Allow the piston or ball to fall to the bottom of the column. Remove the mouthpiece from your mouth and breathe out normally. Rest for a few seconds and repeat Steps 1 through 7 at least 10 times every 1-2 hours when you are awake. Take your time and take a few normal breaths between deep breaths. The spirometer may include an indicator to show your best effort. Use the indicator as a goal to work toward during each repetition. After each set of 10 deep breaths, practice coughing to be sure your lungs are clear. If you have an incision (the cut made at the time of surgery), support your incision when coughing by placing a pillow or rolled up towels firmly against it. Once you are able to get out of bed, walk around indoors and cough well. You may stop using the incentive spirometer when instructed by your caregiver.  RISKS AND COMPLICATIONS Take your time so you do not get dizzy or  light-headed. If you are in pain, you may need to take or ask for pain medication before doing incentive spirometry. It is harder to take a deep breath if you are having pain. AFTER USE Rest and breathe slowly and easily. It can be helpful to keep track of a log of your progress. Your caregiver can provide you with a simple table to help with this. If you are using the spirometer at home, follow these instructions: SEEK MEDICAL CARE IF:  You are having difficultly using the spirometer. You have trouble using the spirometer as often as instructed. Your pain medication is not giving enough relief while using the spirometer. You develop fever of 100.5 F (38.1 C) or higher. SEEK IMMEDIATE MEDICAL CARE IF:  You cough up bloody sputum that had not been present before. You develop fever of 102 F (38.9 C) or greater. You develop worsening pain at or near the incision site. MAKE SURE YOU:  Understand these instructions. Will watch your condition. Will get help right away if you are not doing well or get worse. Document Released: 01/20/2007 Document Revised: 12/02/2011 Document Reviewed: 03/23/2007 ExitCare Patient Information 2014 ExitCare, [place].   ________________________________________________________________________  WHAT IS A BLOOD TRANSFUSION? Blood Transfusion Information  A transfusion is the replacement of blood or some of its parts. Blood is made up of multiple cells which provide different functions. Red blood cells carry oxygen and are used for blood loss replacement. White blood cells fight against infection. Platelets control bleeding. Plasma helps clot blood. Other blood products are available for specialized needs, such as hemophilia or other clotting disorders. BEFORE THE TRANSFUSION  Who gives blood for transfusions?  Healthy volunteers who are fully evaluated to make sure their blood is safe. This is blood  bank blood. Transfusion therapy is the safest it has ever  been in the practice of medicine. Before blood is taken from a donor, a complete history is taken to make sure that person has no history of diseases nor engages in risky social behavior (examples are intravenous drug use or sexual activity with multiple partners). The donor's travel history is screened to minimize risk of transmitting infections, such as malaria. The donated blood is tested for signs of infectious diseases, such as HIV and hepatitis. The blood is then tested to be sure it is compatible with you in order to minimize the chance of a transfusion reaction. If you or a relative donates blood, this is often done in anticipation of surgery and is not appropriate for emergency situations. It takes many days to process the donated blood. RISKS AND COMPLICATIONS Although transfusion therapy is very safe and saves many lives, the main dangers of transfusion include:  Getting an infectious disease. Developing a transfusion reaction. This is an allergic reaction to something in the blood you were given. Every precaution is taken to prevent this. The decision to have a blood transfusion has been considered carefully by your caregiver before blood is given. Blood is not given unless the benefits outweigh the risks. AFTER THE TRANSFUSION Right after receiving a blood transfusion, you will usually feel much better and more energetic. This is especially true if your red blood cells have gotten low (anemic). The transfusion raises the level of the red blood cells which carry oxygen, and this usually causes an energy increase. The nurse administering the transfusion will monitor you carefully for complications. HOME CARE INSTRUCTIONS  No special instructions are needed after a transfusion. You may find your energy is better. Speak with your caregiver about any limitations on activity for underlying diseases you may have. SEEK MEDICAL CARE IF:  Your condition is not improving after your transfusion. You  develop redness or irritation at the intravenous (IV) site. SEEK IMMEDIATE MEDICAL CARE IF:  Any of the following symptoms occur over the next 12 hours: Shaking chills. You have a temperature by mouth above 102 F (38.9 C), not controlled by medicine. Chest, back, or muscle pain. People around you feel you are not acting correctly or are confused. Shortness of breath or difficulty breathing. Dizziness and fainting. You get a rash or develop hives. You have a decrease in urine output. Your urine turns a dark color or changes to pink, red, or brown. Any of the following symptoms occur over the next 10 days: You have a temperature by mouth above 102 F (38.9 C), not controlled by medicine. Shortness of breath. Weakness after normal activity. The white part of the eye turns yellow (jaundice). You have a decrease in the amount of urine or are urinating less often. Your urine turns a dark color or changes to pink, red, or brown. Document Released: 09/06/2000 Document Revised: 12/02/2011 Document Reviewed: 04/25/2008 [facility] Patient Information 2014 [place], [place].  _______________________________________________________________________

## 2022-03-07 NOTE — Progress Notes (Addendum)
COVID Vaccine Completed: yes x2  Date of COVID positive in last 90 days:no  PCP - Ernst Breach, DO Cardiologist - Laural Golden, MD  Chest x-ray - yes PCP, req EKG - yes PCP, req Stress Test - long time per pt ECHO - 2017 Cardiac Cath - yes, around 5 years ago per pt Pacemaker/ICD device last checked: n/a Spinal Cord Stimulator:n/a  Bowel Prep - no  Sleep Study - yes, positive CPAP - yes every night   Fasting Blood Sugar - 115-120 Checks Blood Sugar multiple times a day, free style libre  Blood Thinner Instructions: Aspirin Instructions: ASA 81, hasn't taken in few weeks Last Dose:  Activity level: Can go up a flight of stairs and perform activities of daily living without stopping and without symptoms of chest pain or shortness of breath.   Anesthesia review:   Patient denies shortness of breath, fever, cough and chest pain at PAT appointment  Patient verbalized understanding of instructions that were given to them at the PAT appointment. Patient was also instructed that they will need to review over the PAT instructions again at home before surgery.

## 2022-03-08 ENCOUNTER — Encounter (HOSPITAL_COMMUNITY): Payer: Self-pay

## 2022-03-08 ENCOUNTER — Encounter (HOSPITAL_COMMUNITY)
Admission: RE | Admit: 2022-03-08 | Discharge: 2022-03-08 | Disposition: A | Discharge status: Home / Self Care | Payer: BC Managed Care – PPO | Source: Ambulatory Visit | Attending: Orthopedic Surgery | Admitting: Orthopedic Surgery

## 2022-03-08 VITALS — BP 135/75 | HR 65 | Temp 97.8°F | Resp 14 | Ht 68.0 in | Wt 245.0 lb

## 2022-03-08 DIAGNOSIS — Z01818 Encounter for other preprocedural examination: Secondary | ICD-10-CM

## 2022-03-08 DIAGNOSIS — E119 Type 2 diabetes mellitus without complications: Secondary | ICD-10-CM

## 2022-03-08 DIAGNOSIS — I10 Essential (primary) hypertension: Secondary | ICD-10-CM

## 2022-03-08 DIAGNOSIS — M1712 Unilateral primary osteoarthritis, left knee: Secondary | ICD-10-CM

## 2022-03-08 HISTORY — DX: Unspecified osteoarthritis, unspecified site: M19.90

## 2022-03-08 HISTORY — DX: Depression, unspecified: F32.A

## 2022-03-08 HISTORY — DX: Personal history of urinary calculi: Z87.442

## 2022-03-08 HISTORY — DX: Pneumonia, unspecified organism: J18.9

## 2022-03-08 HISTORY — DX: Gastro-esophageal reflux disease without esophagitis: K21.9

## 2022-03-08 HISTORY — DX: Anxiety disorder, unspecified: F41.9

## 2022-03-08 LAB — CBC
HCT: 46.2 % (ref 39.0–52.0)
Hemoglobin: 15 g/dL (ref 13.0–17.0)
MCH: 30.7 pg (ref 26.0–34.0)
MCHC: 32.5 g/dL (ref 30.0–36.0)
MCV: 94.7 fL (ref 80.0–100.0)
Platelets: 187 10*3/uL (ref 150–400)
RBC: 4.88 MIL/uL (ref 4.22–5.81)
RDW: 13.5 % (ref 11.5–15.5)
WBC: 7.9 10*3/uL (ref 4.0–10.5)
nRBC: 0 % (ref 0.0–0.2)

## 2022-03-08 LAB — SURGICAL PCR SCREEN
MRSA, PCR: NEGATIVE
Staphylococcus aureus: POSITIVE — AB

## 2022-03-08 LAB — BASIC METABOLIC PANEL
Anion gap: 7 (ref 5–15)
BUN: 25 mg/dL — ABNORMAL HIGH (ref 8–23)
CO2: 27 mmol/L (ref 22–32)
Calcium: 9.6 mg/dL (ref 8.9–10.3)
Chloride: 105 mmol/L (ref 98–111)
Creatinine, Ser: 0.91 mg/dL (ref 0.61–1.24)
GFR, Estimated: >60 mL/min (ref >60)
Glucose, Bld: 73 mg/dL (ref 70–99)
Potassium: 3.9 mmol/L (ref 3.5–5.1)
Sodium: 139 mmol/L (ref 135–145)

## 2022-03-08 LAB — GLUCOSE, CAPILLARY: Glucose-Capillary: 98 mg/dL (ref 70–99)

## 2022-03-08 LAB — HEMOGLOBIN A1C
Hgb A1c MFr Bld: 6.8 % — ABNORMAL HIGH (ref 4.8–5.6)
Mean Plasma Glucose: 148.46 mg/dL

## 2022-03-08 NOTE — Progress Notes (Signed)
[facility]+ results routed to Dr. Olin. 

## 2022-03-09 LAB — TYPE AND SCREEN
ABO/RH(D): O POS
Antibody Screen: NEGATIVE

## 2022-03-19 ENCOUNTER — Other Ambulatory Visit: Payer: Self-pay

## 2022-03-19 ENCOUNTER — Encounter (HOSPITAL_COMMUNITY)
Admission: RE | Disposition: A | Discharge status: Home / Self Care | Payer: Self-pay | Source: Ambulatory Visit | Attending: Orthopedic Surgery

## 2022-03-19 ENCOUNTER — Observation Stay (HOSPITAL_COMMUNITY)
Admission: RE | Admit: 2022-03-19 | Discharge: 2022-03-20 | Disposition: A | Discharge status: Home / Self Care | Payer: BC Managed Care – PPO | Source: Ambulatory Visit | Attending: Orthopedic Surgery | Admitting: Orthopedic Surgery

## 2022-03-19 ENCOUNTER — Encounter (HOSPITAL_COMMUNITY): Payer: Self-pay | Admitting: Orthopedic Surgery

## 2022-03-19 ENCOUNTER — Ambulatory Visit (HOSPITAL_COMMUNITY): Payer: BC Managed Care – PPO | Admitting: Certified Registered Nurse Anesthetist

## 2022-03-19 DIAGNOSIS — M1712 Unilateral primary osteoarthritis, left knee: Principal | ICD-10-CM | POA: Insufficient documentation

## 2022-03-19 DIAGNOSIS — Z01818 Encounter for other preprocedural examination: Secondary | ICD-10-CM

## 2022-03-19 DIAGNOSIS — I10 Essential (primary) hypertension: Secondary | ICD-10-CM | POA: Insufficient documentation

## 2022-03-19 DIAGNOSIS — Z7982 Long term (current) use of aspirin: Secondary | ICD-10-CM | POA: Insufficient documentation

## 2022-03-19 DIAGNOSIS — Z7984 Long term (current) use of oral hypoglycemic drugs: Secondary | ICD-10-CM | POA: Diagnosis not present

## 2022-03-19 DIAGNOSIS — Z79899 Other long term (current) drug therapy: Secondary | ICD-10-CM | POA: Diagnosis not present

## 2022-03-19 DIAGNOSIS — E119 Type 2 diabetes mellitus without complications: Secondary | ICD-10-CM | POA: Diagnosis not present

## 2022-03-19 DIAGNOSIS — Z96652 Presence of left artificial knee joint: Secondary | ICD-10-CM

## 2022-03-19 HISTORY — PX: TOTAL KNEE ARTHROPLASTY: SHX125

## 2022-03-19 LAB — GLUCOSE, CAPILLARY
Glucose-Capillary: 152 mg/dL — ABNORMAL HIGH (ref 70–99)
Glucose-Capillary: 130 mg/dL — ABNORMAL HIGH (ref 70–99)
Glucose-Capillary: 170 mg/dL — ABNORMAL HIGH (ref 70–99)
Glucose-Capillary: 231 mg/dL — ABNORMAL HIGH (ref 70–99)

## 2022-03-19 LAB — ABO/RH: ABO/RH(D): O POS

## 2022-03-19 SURGERY — ARTHROPLASTY, KNEE, TOTAL
Anesthesia: Spinal | Site: Knee | Laterality: Left

## 2022-03-19 MED ORDER — KETOROLAC TROMETHAMINE 30 MG/ML IJ SOLN
INTRAMUSCULAR | Status: AC
  Filled 2022-03-19: qty 1

## 2022-03-19 MED ORDER — POVIDONE-IODINE 10 % EX SWAB
2.0000 | Freq: Once | CUTANEOUS | Status: AC
  Administered 2022-03-19: 2 via TOPICAL

## 2022-03-19 MED ORDER — CELECOXIB 200 MG PO CAPS
200.0000 mg | ORAL_CAPSULE | Freq: Two times a day (BID) | ORAL | Status: DC
  Administered 2022-03-19: 200 mg via ORAL
  Filled 2022-03-19: qty 1

## 2022-03-19 MED ORDER — GLIMEPIRIDE 2 MG PO TABS
2.0000 mg | ORAL_TABLET | Freq: Every day | ORAL | Status: DC
  Administered 2022-03-20: 2 mg via ORAL
  Filled 2022-03-19: qty 1

## 2022-03-19 MED ORDER — ESCITALOPRAM OXALATE 10 MG PO TABS
10.0000 mg | ORAL_TABLET | Freq: Every day | ORAL | Status: DC
  Administered 2022-03-19 – 2022-03-20 (×2): 10 mg via ORAL
  Filled 2022-03-19 (×2): qty 1

## 2022-03-19 MED ORDER — DOCUSATE SODIUM 100 MG PO CAPS
100.0000 mg | ORAL_CAPSULE | Freq: Two times a day (BID) | ORAL | Status: DC
  Administered 2022-03-19 – 2022-03-20 (×2): 100 mg via ORAL
  Filled 2022-03-19 (×2): qty 1

## 2022-03-19 MED ORDER — KETOROLAC TROMETHAMINE 30 MG/ML IJ SOLN
INTRAMUSCULAR | Status: DC | PRN
  Administered 2022-03-19: 30 mg via INTRAVENOUS

## 2022-03-19 MED ORDER — LACTATED RINGERS IV SOLN: INTRAVENOUS | Status: DC

## 2022-03-19 MED ORDER — MENTHOL 3 MG MT LOZG: 1.0000 | LOZENGE | OROMUCOSAL | Status: DC | PRN

## 2022-03-19 MED ORDER — OXYCODONE HCL 5 MG PO TABS
5.0000 mg | ORAL_TABLET | ORAL | Status: DC | PRN
  Administered 2022-03-19: 10 mg via ORAL
  Filled 2022-03-19 (×2): qty 2

## 2022-03-19 MED ORDER — ACETAMINOPHEN 500 MG PO TABS
1000.0000 mg | ORAL_TABLET | Freq: Four times a day (QID) | ORAL | Status: AC
  Administered 2022-03-19 – 2022-03-20 (×4): 1000 mg via ORAL
  Filled 2022-03-19 (×4): qty 2

## 2022-03-19 MED ORDER — FERROUS SULFATE 325 (65 FE) MG PO TABS
325.0000 mg | ORAL_TABLET | Freq: Three times a day (TID) | ORAL | Status: DC
  Administered 2022-03-19 – 2022-03-20 (×3): 325 mg via ORAL
  Filled 2022-03-19 (×3): qty 1

## 2022-03-19 MED ORDER — ORAL CARE MOUTH RINSE: 15.0000 mL | Freq: Once | OROMUCOSAL | Status: AC

## 2022-03-19 MED ORDER — POVIDONE-IODINE 10 % EX SWAB: 2.0000 "application " | Freq: Once | CUTANEOUS | Status: DC

## 2022-03-19 MED ORDER — TRANEXAMIC ACID-NACL 1000-0.7 MG/100ML-% IV SOLN
1000.0000 mg | Freq: Once | INTRAVENOUS | Status: AC
  Administered 2022-03-19: 1000 mg via INTRAVENOUS
  Filled 2022-03-19: qty 100

## 2022-03-19 MED ORDER — DIPHENHYDRAMINE HCL 12.5 MG/5ML PO ELIX: 12.5000 mg | ORAL_SOLUTION | ORAL | Status: DC | PRN

## 2022-03-19 MED ORDER — INSULIN ASPART 100 UNIT/ML IJ SOLN
0.0000 [IU] | Freq: Three times a day (TID) | INTRAMUSCULAR | Status: DC
  Administered 2022-03-20: 3 [IU] via SUBCUTANEOUS

## 2022-03-19 MED ORDER — METHOCARBAMOL 500 MG IVPB - SIMPLE MED: 500.0000 mg | Freq: Four times a day (QID) | INTRAVENOUS | Status: DC | PRN

## 2022-03-19 MED ORDER — SODIUM CHLORIDE 0.9 % IV SOLN: INTRAVENOUS | Status: DC

## 2022-03-19 MED ORDER — CEFAZOLIN SODIUM-DEXTROSE 2-4 GM/100ML-% IV SOLN
2.0000 g | Freq: Four times a day (QID) | INTRAVENOUS | Status: AC
  Administered 2022-03-19 – 2022-03-20 (×2): 2 g via INTRAVENOUS
  Filled 2022-03-19 (×2): qty 100

## 2022-03-19 MED ORDER — BUPIVACAINE IN DEXTROSE 0.75-8.25 % IT SOLN
INTRATHECAL | Status: DC | PRN
  Administered 2022-03-19: 1.6 mL via INTRATHECAL

## 2022-03-19 MED ORDER — PIOGLITAZONE HCL 30 MG PO TABS
30.0000 mg | ORAL_TABLET | Freq: Every day | ORAL | Status: DC
  Administered 2022-03-20: 30 mg via ORAL
  Filled 2022-03-19: qty 1

## 2022-03-19 MED ORDER — METOCLOPRAMIDE HCL 5 MG/ML IJ SOLN: 5.0000 mg | Freq: Three times a day (TID) | INTRAMUSCULAR | Status: DC | PRN

## 2022-03-19 MED ORDER — OXYCODONE HCL 5 MG/5ML PO SOLN: 5.0000 mg | Freq: Once | ORAL | Status: DC | PRN

## 2022-03-19 MED ORDER — PROPOFOL 500 MG/50ML IV EMUL
INTRAVENOUS | Status: DC | PRN
  Administered 2022-03-19: 100 ug/kg/min via INTRAVENOUS

## 2022-03-19 MED ORDER — KETOROLAC TROMETHAMINE 30 MG/ML IJ SOLN: 30.0000 mg | Freq: Once | INTRAMUSCULAR | Status: DC | PRN

## 2022-03-19 MED ORDER — BISACODYL 10 MG RE SUPP: 10.0000 mg | Freq: Every day | RECTAL | Status: DC | PRN

## 2022-03-19 MED ORDER — POLYETHYLENE GLYCOL 3350 17 G PO PACK: 17.0000 g | PACK | Freq: Every day | ORAL | Status: DC | PRN

## 2022-03-19 MED ORDER — ONDANSETRON HCL 4 MG/2ML IJ SOLN
INTRAMUSCULAR | Status: DC | PRN
  Administered 2022-03-19: 4 mg via INTRAVENOUS

## 2022-03-19 MED ORDER — CHLORHEXIDINE GLUCONATE 0.12 % MT SOLN
15.0000 mL | Freq: Once | OROMUCOSAL | Status: AC
  Administered 2022-03-19: 15 mL via OROMUCOSAL

## 2022-03-19 MED ORDER — PROPOFOL 1000 MG/100ML IV EMUL
INTRAVENOUS | Status: AC
  Filled 2022-03-19: qty 100

## 2022-03-19 MED ORDER — OXYCODONE HCL 5 MG PO TABS
10.0000 mg | ORAL_TABLET | ORAL | Status: DC | PRN
  Administered 2022-03-20 (×2): 10 mg via ORAL
  Filled 2022-03-19: qty 2

## 2022-03-19 MED ORDER — OXYCODONE HCL 5 MG PO TABS: 5.0000 mg | ORAL_TABLET | Freq: Once | ORAL | Status: DC | PRN

## 2022-03-19 MED ORDER — SODIUM CHLORIDE (PF) 0.9 % IJ SOLN
INTRAMUSCULAR | Status: DC | PRN
  Administered 2022-03-19: 30 mL via INTRAVENOUS

## 2022-03-19 MED ORDER — CEFAZOLIN SODIUM-DEXTROSE 2-4 GM/100ML-% IV SOLN
2.0000 g | INTRAVENOUS | Status: AC
  Administered 2022-03-19: 2 g via INTRAVENOUS
  Filled 2022-03-19: qty 100

## 2022-03-19 MED ORDER — TRANEXAMIC ACID-NACL 1000-0.7 MG/100ML-% IV SOLN
1000.0000 mg | INTRAVENOUS | Status: AC
  Administered 2022-03-19: 1000 mg via INTRAVENOUS
  Filled 2022-03-19: qty 100

## 2022-03-19 MED ORDER — ONDANSETRON HCL 4 MG/2ML IJ SOLN
INTRAMUSCULAR | Status: AC
  Filled 2022-03-19: qty 2

## 2022-03-19 MED ORDER — BUPIVACAINE-EPINEPHRINE 0.25% -1:200000 IJ SOLN
INTRAMUSCULAR | Status: AC
  Filled 2022-03-19: qty 1

## 2022-03-19 MED ORDER — ALLOPURINOL 100 MG PO TABS
100.0000 mg | ORAL_TABLET | Freq: Every day | ORAL | Status: DC
  Administered 2022-03-19 – 2022-03-20 (×2): 100 mg via ORAL
  Filled 2022-03-19 (×2): qty 1

## 2022-03-19 MED ORDER — SODIUM CHLORIDE (PF) 0.9 % IJ SOLN
INTRAMUSCULAR | Status: AC
  Filled 2022-03-19: qty 50

## 2022-03-19 MED ORDER — ALBUTEROL SULFATE HFA 108 (90 BASE) MCG/ACT IN AERS: 2.0000 | INHALATION_SPRAY | Freq: Four times a day (QID) | RESPIRATORY_TRACT | Status: DC | PRN

## 2022-03-19 MED ORDER — DEXAMETHASONE SODIUM PHOSPHATE 10 MG/ML IJ SOLN
INTRAMUSCULAR | Status: AC
  Filled 2022-03-19: qty 1

## 2022-03-19 MED ORDER — MIDAZOLAM HCL 2 MG/2ML IJ SOLN
1.0000 mg | INTRAMUSCULAR | Status: DC
  Administered 2022-03-19: 1 mg via INTRAVENOUS
  Filled 2022-03-19: qty 2

## 2022-03-19 MED ORDER — DAPAGLIFLOZIN PROPANEDIOL 10 MG PO TABS
10.0000 mg | ORAL_TABLET | Freq: Every day | ORAL | Status: DC
  Administered 2022-03-20: 10 mg via ORAL
  Filled 2022-03-19: qty 1

## 2022-03-19 MED ORDER — PHENOL 1.4 % MT LIQD
1.0000 | OROMUCOSAL | Status: DC | PRN
Start: 2022-03-19 — End: 2022-03-20

## 2022-03-19 MED ORDER — METOCLOPRAMIDE HCL 5 MG PO TABS: 5.0000 mg | ORAL_TABLET | Freq: Three times a day (TID) | ORAL | Status: DC | PRN

## 2022-03-19 MED ORDER — METHOCARBAMOL 500 MG PO TABS
500.0000 mg | ORAL_TABLET | Freq: Four times a day (QID) | ORAL | Status: DC | PRN
  Administered 2022-03-19: 500 mg via ORAL
  Filled 2022-03-19: qty 1

## 2022-03-19 MED ORDER — FENTANYL CITRATE PF 50 MCG/ML IJ SOSY
50.0000 ug | PREFILLED_SYRINGE | INTRAMUSCULAR | Status: DC
  Administered 2022-03-19: 50 ug via INTRAVENOUS
  Filled 2022-03-19: qty 2

## 2022-03-19 MED ORDER — CHLORTHALIDONE 25 MG PO TABS
25.0000 mg | ORAL_TABLET | Freq: Every morning | ORAL | Status: DC
  Administered 2022-03-20: 25 mg via ORAL
  Filled 2022-03-19: qty 1

## 2022-03-19 MED ORDER — PROPOFOL 10 MG/ML IV BOLUS
INTRAVENOUS | Status: DC | PRN
  Administered 2022-03-19: 10 mg via INTRAVENOUS

## 2022-03-19 MED ORDER — RIVAROXABAN 10 MG PO TABS
10.0000 mg | ORAL_TABLET | Freq: Every day | ORAL | Status: DC
Start: 2022-03-20 — End: 2022-03-20
  Administered 2022-03-20: 10 mg via ORAL
  Filled 2022-03-19: qty 1

## 2022-03-19 MED ORDER — BUPIVACAINE-EPINEPHRINE (PF) 0.25% -1:200000 IJ SOLN
INTRAMUSCULAR | Status: DC | PRN
  Administered 2022-03-19: 30 mL via PERINEURAL

## 2022-03-19 MED ORDER — ROPIVACAINE HCL 5 MG/ML IJ SOLN
INTRAMUSCULAR | Status: DC | PRN
  Administered 2022-03-19: 20 mL via PERINEURAL

## 2022-03-19 MED ORDER — ONDANSETRON HCL 4 MG/2ML IJ SOLN: 4.0000 mg | Freq: Once | INTRAMUSCULAR | Status: DC | PRN

## 2022-03-19 MED ORDER — ACETAMINOPHEN 325 MG PO TABS: 325.0000 mg | ORAL_TABLET | Freq: Four times a day (QID) | ORAL | Status: DC | PRN

## 2022-03-19 MED ORDER — HYDROMORPHONE HCL 1 MG/ML IJ SOLN: 0.5000 mg | INTRAMUSCULAR | Status: DC | PRN

## 2022-03-19 MED ORDER — HYDROMORPHONE HCL 1 MG/ML IJ SOLN: 0.2500 mg | INTRAMUSCULAR | Status: DC | PRN

## 2022-03-19 MED ORDER — DEXAMETHASONE SODIUM PHOSPHATE 10 MG/ML IJ SOLN
10.0000 mg | Freq: Once | INTRAMUSCULAR | Status: AC
  Administered 2022-03-20: 10 mg via INTRAVENOUS
  Filled 2022-03-19: qty 1

## 2022-03-19 MED ORDER — LISINOPRIL 20 MG PO TABS
40.0000 mg | ORAL_TABLET | Freq: Every day | ORAL | Status: DC
  Administered 2022-03-20: 40 mg via ORAL
  Filled 2022-03-19: qty 2

## 2022-03-19 MED ORDER — ONDANSETRON HCL 4 MG/2ML IJ SOLN: 4.0000 mg | Freq: Four times a day (QID) | INTRAMUSCULAR | Status: DC | PRN

## 2022-03-19 MED ORDER — DEXAMETHASONE SODIUM PHOSPHATE 10 MG/ML IJ SOLN
8.0000 mg | Freq: Once | INTRAMUSCULAR | Status: AC
  Administered 2022-03-19: 8 mg via INTRAVENOUS

## 2022-03-19 MED ORDER — ONDANSETRON HCL 4 MG PO TABS: 4.0000 mg | ORAL_TABLET | Freq: Four times a day (QID) | ORAL | Status: DC | PRN

## 2022-03-19 SURGICAL SUPPLY — 57 items
ATTUNE MED ANAT PAT 41 KNEE (Knees) ×1 IMPLANT
ATTUNE PSFEM LTSZ6 NARCEM KNEE (Femur) ×1 IMPLANT
ATTUNE PSRP INSR SZ6 6 KNEE (Insert) ×1 IMPLANT
BAG COUNTER SPONGE SURGICOUNT (BAG) ×1 IMPLANT
BAG ZIPLOCK 12X15 (MISCELLANEOUS) IMPLANT
BASE TIBIA ATTUNE KNEE SYS SZ6 (Knees) IMPLANT
BLADE SAW SGTL 11.0X1.19X90.0M (BLADE) IMPLANT
BLADE SAW SGTL 13.0X1.19X90.0M (BLADE) ×2 IMPLANT
BNDG ELASTIC 6X5.8 VLCR STR LF (GAUZE/BANDAGES/DRESSINGS) ×2 IMPLANT
BOWL SMART MIX CTS (DISPOSABLE) ×2 IMPLANT
CEMENT HV SMART SET (Cement) ×2 IMPLANT
CUFF TOURN SGL QUICK 34 (TOURNIQUET CUFF) ×1
CUFF TRNQT CYL 34X4.125X (TOURNIQUET CUFF) ×1 IMPLANT
DERMABOND ADVANCED (GAUZE/BANDAGES/DRESSINGS) ×1
DERMABOND ADVANCED .7 DNX12 (GAUZE/BANDAGES/DRESSINGS) ×1 IMPLANT
DRAPE SHEET LG 3/4 BI-LAMINATE (DRAPES) ×2 IMPLANT
DRAPE U-SHAPE 47X51 STRL (DRAPES) ×2 IMPLANT
DRESSING AQUACEL AG SP 3.5X10 (GAUZE/BANDAGES/DRESSINGS) ×1 IMPLANT
DRSG AQUACEL AG ADV 3.5X10 (GAUZE/BANDAGES/DRESSINGS) ×1 IMPLANT
DRSG AQUACEL AG SP 3.5X10 (GAUZE/BANDAGES/DRESSINGS) ×2
DURAPREP 26ML APPLICATOR (WOUND CARE) ×4 IMPLANT
ELECT REM PT RETURN 15FT ADLT (MISCELLANEOUS) ×2 IMPLANT
FACESHIELD WRAPAROUND (MASK) ×2 IMPLANT
FACESHIELD WRAPAROUND OR TEAM (MASK) ×1 IMPLANT
GLOVE BIO SURGEON STRL SZ 6 (GLOVE) ×2 IMPLANT
GLOVE BIOGEL PI IND STRL 6.5 (GLOVE) ×1 IMPLANT
GLOVE BIOGEL PI IND STRL 7.5 (GLOVE) ×1 IMPLANT
GLOVE BIOGEL PI INDICATOR 6.5 (GLOVE) ×1
GLOVE BIOGEL PI INDICATOR 7.5 (GLOVE) ×1
GLOVE ORTHO TXT STRL SZ7.5 (GLOVE) ×4 IMPLANT
GOWN STRL REUS W/ TWL LRG LVL3 (GOWN DISPOSABLE) ×3 IMPLANT
GOWN STRL REUS W/TWL LRG LVL3 (GOWN DISPOSABLE) ×3
HANDPIECE INTERPULSE COAX TIP (DISPOSABLE) ×1
HOLDER FOLEY CATH W/STRAP (MISCELLANEOUS) ×1 IMPLANT
KIT TURNOVER KIT A (KITS) ×1 IMPLANT
MANIFOLD NEPTUNE II (INSTRUMENTS) ×2 IMPLANT
NDL SAFETY ECLIPSE 18X1.5 (NEEDLE) IMPLANT
NEEDLE HYPO 18GX1.5 SHARP (NEEDLE)
NS IRRIG 1000ML POUR BTL (IV SOLUTION) ×2 IMPLANT
PACK TOTAL KNEE CUSTOM (KITS) ×2 IMPLANT
PROTECTOR NERVE ULNAR (MISCELLANEOUS) ×2 IMPLANT
SET HNDPC FAN SPRY TIP SCT (DISPOSABLE) ×1 IMPLANT
SET PAD KNEE POSITIONER (MISCELLANEOUS) ×2 IMPLANT
SPIKE FLUID TRANSFER (MISCELLANEOUS) ×4 IMPLANT
SUT MNCRL AB 4-0 PS2 18 (SUTURE) ×2 IMPLANT
SUT STRATAFIX PDS+ 0 24IN (SUTURE) ×2 IMPLANT
SUT VIC AB 1 CT1 36 (SUTURE) ×2 IMPLANT
SUT VIC AB 2-0 CT1 27 (SUTURE) ×2
SUT VIC AB 2-0 CT1 TAPERPNT 27 (SUTURE) ×2 IMPLANT
SYR 3ML LL SCALE MARK (SYRINGE) ×2 IMPLANT
TIBIA ATTUNE KNEE SYS BASE SZ6 (Knees) ×2 IMPLANT
TOWEL GREEN STERILE FF (TOWEL DISPOSABLE) ×2 IMPLANT
TRAY CATH INTERMITTENT SS 16FR (CATHETERS) ×2 IMPLANT
TRAY FOLEY MTR SLVR 16FR STAT (SET/KITS/TRAYS/PACK) ×2 IMPLANT
TUBE SUCTION HIGH CAP CLEAR NV (SUCTIONS) ×2 IMPLANT
WATER STERILE IRR 1000ML POUR (IV SOLUTION) ×4 IMPLANT
WRAP KNEE MAXI GEL POST OP (GAUZE/BANDAGES/DRESSINGS) ×2 IMPLANT

## 2022-03-19 NOTE — Anesthesia Procedure Notes (Signed)
Anesthesia Regional Block: Adductor canal block   Pre-Anesthetic Checklist: , timeout performed,  Correct Patient, Correct Site, Correct Laterality,  Correct Procedure, Correct Position, site marked,  Risks and benefits discussed,  Surgical consent,  Pre-op evaluation,  At surgeon's request and post-op pain management  Laterality: Left  Prep: chloraprep       Needles:  Injection technique: Single-shot  Needle Type: Echogenic Needle     Needle Length: 9cm      Additional Needles:   Procedures:,,,, ultrasound used (permanent image in chart),,    Narrative:  Start time: 03/19/2022 12:02 PM End time: 03/19/2022 12:08 PM Injection made incrementally with aspirations every 5 mL.  Performed by: Personally  Anesthesiologist: Eilene Ghazi, MD  Additional Notes: Patient tolerated the procedure well without complications
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
## 2022-03-19 NOTE — Anesthesia Procedure Notes (Signed)
Anesthesia Procedure Image    

## 2022-03-19 NOTE — Anesthesia Procedure Notes (Signed)
Spinal  Patient location during procedure: OR End time: 03/19/2022 12:48 PM Reason for block: surgical anesthesia Staffing Performed: resident/CRNA  Anesthesiologist: Eilene Ghazi, MD Resident/CRNA: Orest Dikes, CRNA Performed by: Orest Dikes, CRNA Authorized by: Eilene Ghazi, MD   Preanesthetic Checklist Completed: patient identified, IV checked, site marked, risks and benefits discussed, surgical consent, monitors and equipment checked, pre-op evaluation and timeout performed Spinal Block Patient position: sitting Prep: DuraPrep Patient monitoring: heart rate, cardiac monitor, continuous pulse ox and blood pressure Approach: midline Location: L3-4 Injection technique: single-shot Needle Needle type: Pencan  Needle gauge: 24 G Needle length: 10 cm Assessment Sensory level: T4 Events: CSF return Additional Notes IV functioning, monitors applied to pt. Expiration date of kit checked and confirmed to be in date. Sterile prep and drape, hand hygiene and sterile gloves used. Pt was positioned and spine was prepped in sterile fashion. Skin was anesthetized with lidocaine. Free flow of clear CSF obtained prior to injecting local anesthetic into CSF x 1 attempt. Spinal needle aspirated freely following injection. Needle was carefully withdrawn, and pt tolerated procedure well. Loss of motor and sensory on exam post injection.

## 2022-03-19 NOTE — Op Note (Signed)
NAME:  Noah Hampton                      MEDICAL RECORD NO.:  [id number]                             FACILITY:  [facility]      PHYSICIAN:  Madlyn Frankel. Charlann Boxer, M.D.  DATE OF BIRTH:  [birth year]      DATE OF PROCEDURE:  03/19/2022                                     OPERATIVE REPORT         PREOPERATIVE DIAGNOSIS:  Left knee osteoarthritis.      POSTOPERATIVE DIAGNOSIS:  Left knee osteoarthritis.      FINDINGS:  The patient was noted to have complete loss of cartilage and   bone-on-bone arthritis with associated osteophytes in the medial and patellofemoral compartments of   the knee with a significant synovitis and associated effusion.  The patient had failed months of conservative treatment including medications, injection therapy, activity modification.     PROCEDURE:  Left total knee replacement.      COMPONENTS USED:  DePuy Attune rotating platform posterior stabilized knee   system, a size 6N femur, 5 tibia, size 6 mm PS AOX insert, and 41 anatomic patellar   button.      SURGEON:  Madlyn Frankel. Charlann Boxer, M.D.      ASSISTANT:  Rosalene Billings, PA-C.      ANESTHESIA:  Regional and Spinal.      SPECIMENS:  None.      COMPLICATION:  None.      DRAINS:  None.  EBL: <150 cc      TOURNIQUET TIME:   Total Tourniquet Time Documented: Thigh (Left) - 31 minutes Total: Thigh (Left) - 31 minutes  .      The patient was stable to the recovery room.      INDICATION FOR PROCEDURE:  Noah Hampton is a 71 y.o. male patient of   mine.  The patient had been seen, evaluated, and treated for months conservatively in the   office with medication, activity modification, and injections.  The patient had   radiographic changes of bone-on-bone arthritis with endplate sclerosis and osteophytes noted.  Based on the radiographic changes and failed conservative measures, the patient   decided to proceed with definitive treatment, total knee replacement.  Risks of infection, DVT, component failure, need for  revision surgery, neurovascular injury were reviewed in the office setting.  The postop course was reviewed stressing the efforts to maximize post-operative satisfaction and function.  Consent was obtained for benefit of pain   relief.      PROCEDURE IN DETAIL:  The patient was brought to the operative theater.   Once adequate anesthesia, preoperative antibiotics, 2 gm of Ancef,1 gm of Tranexamic Acid, and 10 mg of Decadron administered, the patient was positioned supine with a left thigh tourniquet placed.  The  left lower extremity was prepped and draped in sterile fashion.  A time-   out was performed identifying the patient, planned procedure, and the appropriate extremity.      The left lower extremity was placed in the [facility] leg holder.  The leg was   exsanguinated, tourniquet elevated to 250 mmHg.  A midline incision was   made followed  by median parapatellar arthrotomy.  Following initial   exposure, attention was first directed to the patella.  Precut   measurement was noted to be 25 mm.  I resected down to 14 mm and used a   41 anatomic patellar button to restore patellar height as well as cover the cut surface.      The lug holes were drilled and a metal shim was placed to protect the   patella from retractors and saw blade during the procedure.      At this point, attention was now directed to the femur.  The femoral   canal was opened with a drill, irrigated to try to prevent fat emboli.  An   intramedullary rod was passed at 5 degrees valgus, 9 mm of bone was   resected off the distal femur.  Following this resection, the tibia was   subluxated anteriorly.  Using the extramedullary guide, 2 mm of bone was resected off   the proximal medial tibia.  We confirmed the gap would be   stable medially and laterally with a size 5 spacer block as well as confirmed that the tibial cut was perpendicular in the coronal plane, checking with an alignment rod.      Once this was done, I  sized the femur to be a size 6 in the anterior-   posterior dimension, chose a narrow component based on medial and   lateral dimension.  The size 6 rotation block was then pinned in   position anterior referenced using the C-clamp to set rotation.  The   anterior, posterior, and  chamfer cuts were made without difficulty nor   notching making certain that I was along the anterior cortex to help   with flexion gap stability.      The final box cut was made off the lateral aspect of distal femur.      At this point, the tibia was sized to be a size 5.  The size 5 tray was   then pinned in position through the medial third of the tubercle,   drilled, and keel punched.  Trial reduction was now carried with a 6 femur,  5 tibia, a size 6 mm PS insert, and the 41 anatomic patella botton.  The knee was brought to full extension with good flexion stability with the patella   tracking through the trochlea without application of pressure.  Given   all these findings the trial components removed.  Final components were   opened and cement was mixed.  The knee was irrigated with normal saline solution and pulse lavage.  The synovial lining was   then injected with 30 cc of 0.25% Marcaine with epinephrine, 1 cc of Toradol and 30 cc of NS for a total of 61 cc.     Final implants were then cemented onto cleaned and dried cut surfaces of bone with the knee brought to extension with a size 6 mm PS trial insert.      Once the cement had fully cured, excess cement was removed   throughout the knee.  I confirmed that I was satisfied with the range of   motion and stability, and the final size 6 mm PS AOX insert was chosen.  It was   placed into the knee.      The tourniquet had been let down at 31 minutes.  No significant   hemostasis was required.  The extensor mechanism was then reapproximated using #1 Vicryl and #1  Stratafix sutures with the knee   in flexion.  The   remaining wound was closed with 2-0  Vicryl and running 4-0 Monocryl.   The knee was cleaned, dried, dressed sterilely using Dermabond and   Aquacel dressing.  The patient was then   brought to recovery room in stable condition, tolerating the procedure   well.   Please note that Physician Assistant, Rosalene Billings, PA-C was present for the entirety of the case, and was utilized for pre-operative positioning, peri-operative retractor management, general facilitation of the procedure and for primary wound closure at the end of the case.              Madlyn Frankel Charlann Boxer, M.D.    03/19/2022 2:21 PM

## 2022-03-20 ENCOUNTER — Encounter (HOSPITAL_COMMUNITY): Payer: Self-pay | Admitting: Orthopedic Surgery

## 2022-03-20 DIAGNOSIS — M1712 Unilateral primary osteoarthritis, left knee: Secondary | ICD-10-CM | POA: Diagnosis not present

## 2022-03-20 LAB — CBC
HCT: 41.3 % (ref 39.0–52.0)
Hemoglobin: 12.9 g/dL — ABNORMAL LOW (ref 13.0–17.0)
MCH: 30.3 pg (ref 26.0–34.0)
MCHC: 31.2 g/dL (ref 30.0–36.0)
MCV: 96.9 fL (ref 80.0–100.0)
Platelets: 132 10*3/uL — ABNORMAL LOW (ref 150–400)
RBC: 4.26 MIL/uL (ref 4.22–5.81)
RDW: 13.3 % (ref 11.5–15.5)
WBC: 10.8 10*3/uL — ABNORMAL HIGH (ref 4.0–10.5)
nRBC: 0 % (ref 0.0–0.2)

## 2022-03-20 LAB — BASIC METABOLIC PANEL
Anion gap: 8 (ref 5–15)
BUN: 33 mg/dL — ABNORMAL HIGH (ref 8–23)
CO2: 25 mmol/L (ref 22–32)
Calcium: 8.8 mg/dL — ABNORMAL LOW (ref 8.9–10.3)
Chloride: 108 mmol/L (ref 98–111)
Creatinine, Ser: 1.23 mg/dL (ref 0.61–1.24)
GFR, Estimated: >60 mL/min (ref >60)
Glucose, Bld: 144 mg/dL — ABNORMAL HIGH (ref 70–99)
Potassium: 4.2 mmol/L (ref 3.5–5.1)
Sodium: 141 mmol/L (ref 135–145)

## 2022-03-20 LAB — GLUCOSE, CAPILLARY: Glucose-Capillary: 184 mg/dL — ABNORMAL HIGH (ref 70–99)

## 2022-03-20 MED ORDER — RIVAROXABAN 10 MG PO TABS: 10.0000 mg | ORAL_TABLET | Freq: Every day | ORAL | 0 refills | Status: DC

## 2022-03-20 MED ORDER — POLYETHYLENE GLYCOL 3350 17 G PO PACK: 17.0000 g | PACK | Freq: Every day | ORAL | 0 refills | Status: DC | PRN

## 2022-03-20 MED ORDER — OXYCODONE HCL 5 MG PO TABS: 5.0000 mg | ORAL_TABLET | ORAL | 0 refills | Status: DC | PRN

## 2022-03-20 MED ORDER — METHOCARBAMOL 500 MG PO TABS: 500.0000 mg | ORAL_TABLET | Freq: Four times a day (QID) | ORAL | 0 refills | Status: DC | PRN

## 2022-03-20 MED ORDER — ACETAMINOPHEN 500 MG PO TABS: 1000.0000 mg | ORAL_TABLET | Freq: Four times a day (QID) | ORAL | 0 refills | Status: DC

## 2022-03-20 MED ORDER — DOCUSATE SODIUM 100 MG PO CAPS: 100.0000 mg | ORAL_CAPSULE | Freq: Two times a day (BID) | ORAL | 0 refills | Status: DC

## 2022-03-20 NOTE — Progress Notes (Signed)
   Subjective: 1 Day Post-Op Procedure(s) (LRB): TOTAL KNEE ARTHROPLASTY (Left) Patient reports pain as mild.   Patient seen in rounds by Dr. Charlann Boxer. Patient is well, and has had no acute complaints or problems. No acute events overnight. Foley catheter removed. Patient has not been up with PT yet. We will start therapy today.   Objective: Vital signs in last 24 hours: Temp:  [97.6 F (36.4 C)-98.6 F (37 C)] 98.6 F (37 C) (06/28 0629) Pulse Rate:  [39-69] 64 (06/28 0629) Resp:  [14-25] 18 (06/28 0629) BP: (117-167)/(63-83) 147/72 (06/28 0629) SpO2:  [90 %-98 %] 95 % (06/28 0629) Weight:  [111.1 kg] 111.1 kg (06/27 1052)  Intake/Output from previous day:  Intake/Output Summary (Last 24 hours) at 03/20/2022 0739 Last data filed at 03/20/2022 0612 Gross per 24 hour  Intake 2489.93 ml  Output 825 ml  Net 1664.93 ml     Intake/Output this shift: No intake/output data recorded.  Labs: Recent Labs    03/20/22 0320  HGB 12.9*   Recent Labs    03/20/22 0320  WBC 10.8*  RBC 4.26  HCT 41.3  PLT 132*   Recent Labs    03/20/22 0603  NA 141  K 4.2  CL 108  CO2 25  BUN 33*  CREATININE 1.23  GLUCOSE 144*  CALCIUM 8.8*   No results for input(s): "LABPT", "INR" in the last 72 hours.  Exam: General - Patient is Alert and Oriented Extremity - Neurologically intact Sensation intact distally Intact pulses distally Dorsiflexion/Plantar flexion intact Dressing - dressing C/D/I Motor Function - intact, moving foot and toes well on exam.   Past Medical History:  Diagnosis Date   Anxiety    Arthritis    Depression    Diabetes mellitus without complication (HCC)    Dyslipidemia    GERD (gastroesophageal reflux disease)    History of kidney stones    Hypertension    Obesity    BMI 41   Pneumonia    Sleep apnea    on C-pap    Assessment/Plan: 1 Day Post-Op Procedure(s) (LRB): TOTAL KNEE ARTHROPLASTY (Left) Principal Problem:   S/P total knee arthroplasty,  left  Estimated body mass index is 37.25 kg/m as calculated from the following:   Height as of this encounter: 5\' 8"  (1.727 m).   Weight as of this encounter: 111.1 kg. Advance diet Up with therapy D/C IV fluids   Patient's anticipated LOS is less than 2 midnights, meeting these requirements: - Younger than 44 - Lives within 1 hour of care - Has a competent adult at home to recover with post-op recover - NO history of  - Chronic pain requiring opiods  - Diabetes  - Coronary Artery Disease  - Heart failure  - Heart attack  - Stroke  - DVT/VTE  - Cardiac arrhythmia  - Respiratory Failure/COPD  - Renal failure  - Anemia  - Advanced Liver disease     DVT Prophylaxis - Aspirin Weight bearing as tolerated.  Hgb stable at 12.9 this AM. Cr. Elevated to 1.23, will d/c celebrex.   Plan is to go Home after hospital stay. Plan for discharge today following 1-2 sessions of PT as long as they are meeting their goals. Patient is scheduled for OPPT. Follow up in the office in 2 weeks.   75, PA-C Orthopedic Surgery [phone] 03/20/2022, 7:39 AM

## 2022-03-20 NOTE — TOC Transition Note (Signed)
Transition of Care [facility]) - CM/SW Discharge Note   Patient Details  Name: Noah Hampton MRN: [id number] Date of Birth: [birth year]  Transition of [facility]) CM/SW Contact:  Lennart Pall, LCSW Phone Number: 03/20/2022, 9:31 AM   Clinical Narrative:    Met with pt and confirming receipt of rolling walker via [employer].  Pt set up with OPPT at [facility] in [place].  No TOC needs.   Final next level of care: OP Rehab Barriers to Discharge: No Barriers Identified   Patient Goals and CMS Choice Patient states their goals for this hospitalization and ongoing recovery are:: return home      Discharge Placement                       Discharge Plan and Services                DME Arranged: Walker rolling DME Agency: [place]                  Social Determinants of Health (SDOH) Interventions     Readmission Risk Interventions     No data to display

## 2022-03-20 NOTE — Plan of Care (Signed)

## 2022-03-20 NOTE — Evaluation (Addendum)
Physical Therapy Evaluation Patient Details Name: Noah Hampton MRN: [id number] DOB: 05/11/1951 Today's Date: 03/20/2022  History of Present Illness  71 y.o. male admitted 03/19/22 for L TKA. PMH includes: DM, anxiety, HTN, obesity, sleep apnea.  Clinical Impression  Pt ambulated 120' with RW, no loss of balance. He demonstrates good understanding of HEP. He is ready to DC home from a PT standpoint.        Recommendations for follow up therapy are one component of a multi-disciplinary discharge planning process, led by the attending physician.  Recommendations may be updated based on patient status, additional functional criteria and insurance authorization.  Follow Up Recommendations Follow physician's recommendations for discharge plan and follow up therapies      Assistance Recommended at Discharge Intermittent Supervision/Assistance  Patient can return home with the following  A little help with bathing/dressing/bathroom;Assistance with cooking/housework;Assist for transportation    Equipment Recommendations Rolling walker (2 wheels)  Recommendations for Other Services       Functional Status Assessment Patient has had a recent decline in their functional status and demonstrates the ability to make significant improvements in function in a reasonable and predictable amount of time.     Precautions / Restrictions Precautions Precautions: Fall; knee (reviewed no pillow under knee) Restrictions Weight Bearing Restrictions: No      Mobility  Bed Mobility Overal bed mobility: Modified Independent                  Transfers Overall transfer level: Needs assistance Equipment used: Rolling walker (2 wheels) Transfers: Sit to/from Stand Sit to Stand: Supervision           General transfer comment: VCs hand placement    Ambulation/Gait Ambulation/Gait assistance: Supervision Gait Distance (Feet): 120 Feet Assistive device: Rolling walker (2 wheels) Gait  Pattern/deviations: Step-to pattern Gait velocity: decr     General Gait Details: VCs sequencing, no loss of balance  Stairs            Wheelchair Mobility    Modified Rankin (Stroke Patients Only)       Balance Overall balance assessment: Modified Independent                                           Pertinent Vitals/Pain Pain Assessment Pain Assessment: 0-10 Pain Score: 5  Pain Location: L knee Pain Descriptors / Indicators: Sore Pain Intervention(s): Limited activity within patient's tolerance, Monitored during session, Premedicated before session, Ice applied    Home Living Family/patient expects to be discharged to:: Private residence Living Arrangements: Spouse/significant other Available Help at Discharge: Family;Available 24 hours/day   Home Access: Level entry       Home Layout: Able to live on main level with bedroom/bathroom Home Equipment: Cane - single point      Prior Function Prior Level of Function : Independent/Modified Independent                     Hand Dominance        Extremity/Trunk Assessment   Upper Extremity Assessment Upper Extremity Assessment: Overall WFL for tasks assessed    Lower Extremity Assessment Lower Extremity Assessment: LLE deficits/detail LLE Deficits / Details: SLR 3/5, knee AAROM 0-45* LLE Sensation: WNL LLE Coordination: WNL    Cervical / Trunk Assessment Cervical / Trunk Assessment: Normal  Communication   Communication: No difficulties  Cognition Arousal/Alertness: Awake/alert Behavior  During Therapy: WFL for tasks assessed/performed Overall Cognitive Status: Within Functional Limits for tasks assessed                                          General Comments      Exercises Total Joint Exercises Ankle Circles/Pumps: AROM, Both, 10 reps, Supine Quad Sets: AROM, Left, 5 reps, Supine Short Arc Quad: AROM, Left, 5 reps, Supine Heel Slides: AAROM,  Left, 5 reps, Supine Hip ABduction/ADduction: AAROM, Left, 5 reps, Supine Straight Leg Raises: AROM, Left, 5 reps, Supine Long Arc Quad: AROM, Left, 5 reps, Seated Knee Flexion: AAROM, Left, 10 reps, Seated Goniometric ROM: 0-45* AAROM L knee   Assessment/Plan    PT Assessment All further PT needs can be met in the next venue of care  PT Problem List Decreased range of motion;Decreased strength;Decreased activity tolerance       PT Treatment Interventions      PT Goals (Current goals can be found in the Care Plan section)  Acute Rehab PT Goals 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 PT Goal Formulation: All assessment and education complete, DC therapy    Frequency       Co-evaluation               AM-PAC PT "6 Clicks" Mobility  Outcome Measure Help needed turning from your back to your side while in a flat bed without using bedrails?: None Help needed moving from lying on your back to sitting on the side of a flat bed without using bedrails?: A Little Help needed moving to and from a bed to a chair (including a wheelchair)?: None Help needed standing up from a chair using your arms (e.g., wheelchair or bedside chair)?: None Help needed to walk in hospital room?: None Help needed climbing 3-5 steps with a railing? : A Little 6 Click Score: 22    End of Session Equipment Utilized During Treatment: Gait belt Activity Tolerance: Patient tolerated treatment well Patient left: in chair;with chair alarm set;with call bell/phone within reach Nurse Communication: Mobility status PT Visit Diagnosis: Difficulty in walking, not elsewhere classified (R26.2);Pain Pain - Right/Left: Left Pain - part of body: Knee    Time: 8648-4720 PT Time Calculation (min) (ACUTE ONLY): 26 min   Charges:   PT Evaluation $PT Eval Moderate Complexity: 1 Mod PT Treatments $Gait Training: 8-22 mins       Blondell Reveal Kistler PT 03/20/2022  Acute Rehabilitation  Services  Office [phone]

## 2022-03-20 NOTE — Progress Notes (Signed)
Discharge instructions reviewed with patient and caregiver. All questions and concerns addressed. IV removed per protocol, intact tolerated well. All belongings given. Currently denies any complaints of pain, vital signs stable.

## 2022-03-27 NOTE — Discharge Summary (Signed)
Patient ID: Noah Hampton MRN: [id number] DOB/AGE: 72/28/1952 72 y.o.  Admit date: 03/19/2022 Discharge date: 03/20/2022  Admission Diagnoses:  Left knee osteoarthritis  Discharge Diagnoses:  Principal Problem:   S/P total knee arthroplasty, left   Past Medical History:  Diagnosis Date   Anxiety    Arthritis    Depression    Diabetes mellitus without complication (HCC)    Dyslipidemia    GERD (gastroesophageal reflux disease)    History of kidney stones    Hypertension    Obesity    BMI 41   Pneumonia    Sleep apnea    on C-pap    Surgeries: Procedure(s): TOTAL KNEE ARTHROPLASTY on 03/19/2022   Consultants:   Discharged Condition: Improved  Hospital Course: Noah Hampton is an 71 y.o. male who was admitted 03/19/2022 for operative treatment ofS/P total knee arthroplasty, left. Patient has severe unremitting pain that affects sleep, daily activities, and work/hobbies. After pre-op clearance the patient was taken to the operating room on 03/19/2022 and underwent  Procedure(s): TOTAL KNEE ARTHROPLASTY.    Patient was given perioperative antibiotics:  Anti-infectives (From admission, onward)    Start     Dose/Rate Route Frequency Ordered Stop   03/19/22 1900  ceFAZolin (ANCEF) IVPB 2g/100 mL premix        2 g 200 mL/hr over 30 Minutes Intravenous Every 6 hours 03/19/22 1424 03/20/22 0154   03/19/22 1030  ceFAZolin (ANCEF) IVPB 2g/100 mL premix        2 g 200 mL/hr over 30 Minutes Intravenous On call to O.R. 03/19/22 1016 03/19/22 1250        Patient was given sequential compression devices, early ambulation, and chemoprophylaxis to prevent DVT. Patient worked with PT and was meeting their goals regarding safe ambulation and transfers.  Patient benefited maximally from hospital stay and there were no complications.    Recent vital signs: No data found.   Recent laboratory studies: No results for input(s): "WBC", "HGB", "HCT", "PLT", "NA", "K", "CL", "CO2", "BUN",  "CREATININE", "GLUCOSE", "INR", "CALCIUM" in the last 72 hours.  Invalid input(s): "PT", "2"   Discharge Medications:   Allergies as of 03/20/2022       Reactions   Statins Other (See Comments)    Myalgias (Muscle Pain)   Alpha-gal Other (See Comments)   unknown   Other Other (See Comments)   Beef/ Unknown        Medication List     STOP taking these medications    aspirin EC 81 MG tablet       TAKE these medications    acetaminophen 500 MG tablet Commonly known as: TYLENOL Take 2 tablets (1,000 mg total) by mouth every 6 (six) hours.   albuterol 108 (90 Base) MCG/ACT inhaler Commonly known as: VENTOLIN HFA Inhale 2 puffs into the lungs every 6 (six) hours as needed for wheezing.   allopurinol 100 MG tablet Commonly known as: ZYLOPRIM Take 100 mg by mouth daily.   chlorthalidone 25 MG tablet Commonly known as: HYGROTON Take 25 mg by mouth every morning.   docusate sodium 100 MG capsule Commonly known as: COLACE Take 1 capsule (100 mg total) by mouth 2 (two) times daily.   escitalopram 10 MG tablet Commonly known as: LEXAPRO Take 10 mg by mouth daily.   Farxiga 10 MG Tabs tablet Generic drug: dapagliflozin propanediol Take 10 mg by mouth daily.   glimepiride 2 MG tablet Commonly known as: AMARYL Take 2 mg by mouth every morning.  lisinopril 40 MG tablet Commonly known as: ZESTRIL Take 40 mg by mouth daily.   methocarbamol 500 MG tablet Commonly known as: ROBAXIN Take 1 tablet (500 mg total) by mouth every 6 (six) hours as needed for muscle spasms. What changed:  medication strength how much to take when to take this reasons to take this   oxyCODONE 5 MG immediate release tablet Commonly known as: Oxy IR/ROXICODONE Take 1 tablet (5 mg total) by mouth every 4 (four) hours as needed for severe pain.   pioglitazone 30 MG tablet Commonly known as: ACTOS Take 30 mg by mouth daily.   polyethylene glycol 17 g packet Commonly known as:  MIRALAX / GLYCOLAX Take 17 g by mouth daily as needed for mild constipation.   rivaroxaban 10 MG Tabs tablet Commonly known as: XARELTO Take 1 tablet (10 mg total) by mouth daily for 14 days. Then take aspirin 81 mg twice daily for another month afterwards.               Discharge Care Instructions  (From admission, onward)           Start     Ordered   03/20/22 0000  Change dressing       Comments: Maintain surgical dressing until follow up in the clinic. If the edges start to pull up, may reinforce with tape. If the dressing is no longer working, may remove and cover with gauze and tape, but must keep the area dry and clean.  Call with any questions or concerns.   03/20/22 0742            Diagnostic Studies: No results found.  Disposition: Discharge disposition: 01-Home or Self Care       Discharge Instructions     Call MD / Call 911   Complete by: As directed    If you experience chest pain or shortness of breath, CALL 911 and be transported to the hospital emergency room.  If you develope a fever above 101 F, pus (white drainage) or increased drainage or redness at the wound, or calf pain, call your surgeon's office.   Change dressing   Complete by: As directed    Maintain surgical dressing until follow up in the clinic. If the edges start to pull up, may reinforce with tape. If the dressing is no longer working, may remove and cover with gauze and tape, but must keep the area dry and clean.  Call with any questions or concerns.   Constipation Prevention   Complete by: As directed    Drink plenty of fluids.  Prune juice may be helpful.  You may use a stool softener, such as Colace (over the counter) 100 mg twice a day.  Use MiraLax (over the counter) for constipation as needed.   Diet - low sodium heart healthy   Complete by: As directed    Increase activity slowly as tolerated   Complete by: As directed    Weight bearing as tolerated with assist device  (walker, cane, etc) as directed, use it as long as suggested by your surgeon or therapist, typically at least 4-6 weeks.   Post-operative opioid taper instructions:   Complete by: As directed    POST-OPERATIVE OPIOID TAPER INSTRUCTIONS: It is important to wean off of your opioid medication as soon as possible. If you do not need pain medication after your surgery it is ok to stop day one. Opioids include: Codeine, Hydrocodone(Norco, Vicodin), Oxycodone(Percocet, oxycontin) and hydromorphone amongst others.  Long term and even short term use of opiods can cause: Increased pain response Dependence Constipation Depression Respiratory depression And more.  Withdrawal symptoms can include Flu like symptoms Nausea, vomiting And more Techniques to manage these symptoms Hydrate well Eat regular healthy meals Stay active Use relaxation techniques(deep breathing, meditating, yoga) Do Not substitute Alcohol to help with tapering If you have been on opioids for less than two weeks and do not have pain than it is ok to stop all together.  Plan to wean off of opioids This plan should start within one week post op of your joint replacement. Maintain the same interval or time between taking each dose and first decrease the dose.  Cut the total daily intake of opioids by one tablet each day Next start to increase the time between doses. The last dose that should be eliminated is the evening dose.      TED hose   Complete by: As directed    Use stockings (TED hose) for 2 weeks on both leg(s).  You may remove them at night for sleeping.        Follow-up Information     Durene Romans, MD. Schedule an appointment as soon as possible for a visit in 2 week(s).   Specialty: Orthopedic Surgery Contact information: [address] [place] [address] [phone]                  Signed: Cassandria Anger 03/27/2022, 9:25 AM

## 2022-03-28 DIAGNOSIS — M79606 Pain in leg, unspecified: Secondary | ICD-10-CM | POA: Insufficient documentation

## 2022-04-19 ENCOUNTER — Other Ambulatory Visit (HOSPITAL_COMMUNITY): Payer: BC Managed Care – PPO

## 2022-04-25 DIAGNOSIS — I2699 Other pulmonary embolism without acute cor pulmonale: Secondary | ICD-10-CM | POA: Insufficient documentation

## 2022-04-29 DIAGNOSIS — I70209 Unspecified atherosclerosis of native arteries of extremities, unspecified extremity: Secondary | ICD-10-CM | POA: Insufficient documentation

## 2022-04-30 ENCOUNTER — Ambulatory Visit: Admit: 2022-04-30 | Payer: BC Managed Care – PPO | Admitting: Orthopedic Surgery

## 2022-04-30 DIAGNOSIS — M1711 Unilateral primary osteoarthritis, right knee: Secondary | ICD-10-CM

## 2022-04-30 SURGERY — ARTHROPLASTY, KNEE, TOTAL
Anesthesia: Spinal | Site: Knee | Laterality: Right

## 2022-05-08 DIAGNOSIS — E88819 Insulin resistance, unspecified: Secondary | ICD-10-CM | POA: Insufficient documentation

## 2022-05-30 DIAGNOSIS — R0609 Other forms of dyspnea: Secondary | ICD-10-CM | POA: Insufficient documentation

## 2022-06-08 DIAGNOSIS — I160 Hypertensive urgency: Secondary | ICD-10-CM | POA: Insufficient documentation

## 2022-07-12 NOTE — Patient Instructions (Signed)
DUE TO COVID-19 ONLY TWO VISITORS  (aged 72 and older)  ARE ALLOWED TO COME WITH YOU AND STAY IN THE WAITING ROOM ONLY DURING PRE OP AND PROCEDURE.   **NO VISITORS ARE ALLOWED IN THE SHORT STAY AREA OR RECOVERY ROOM!!**  IF YOU WILL BE ADMITTED INTO THE HOSPITAL YOU ARE ALLOWED ONLY FOUR SUPPORT PEOPLE DURING VISITATION HOURS ONLY (7 AM -8PM)   The support person(s) must pass our screening, gel in and out, and wear a mask at all times, including in the patient's room. Patients must also wear a mask when staff or their support person are in the room. Visitors GUEST BADGE MUST BE WORN VISIBLY  One adult visitor may remain with you overnight and MUST be in the room by 8 P.M.     Your procedure is scheduled on: 07/23/22   Report to [facility] Main Entrance    Report to admitting at 1:15 PM   Call this number if you have problems the morning of surgery [phone]   Do not eat food :After Midnight.   After Midnight you may have the following liquids until _12:45 PM____DAY OF SURGERY  Water Black Coffee (sugar ok, NO MILK/CREAM OR CREAMERS)  Tea (sugar ok, NO MILK/CREAM OR CREAMERS) regular and decaf                             Plain Jell-O (NO RED)                                           Fruit ices (not with fruit pulp, NO RED)                                     Popsicles (NO RED)                                                                  Juice: apple, WHITE grape, WHITE cranberry Sports drinks like Gatorade (NO RED)                    The day of surgery:  Drink ONE  G2 at  12:30 PM the morning of surgery. Drink in one sitting. Do not sip.  This drink was given to you during your hospital  pre-op appointment visit. Nothing else to drink after completing the   G2. At 12:45 PM          If you have questions, please contact your surgeon's office.      Oral Hygiene is also important to reduce your risk of infection.                                    Remember  - BRUSH YOUR TEETH THE MORNING OF SURGERY WITH YOUR REGULAR TOOTHPASTE    Take these medicines the morning of surgery with A SIP OF WATER: Use your inhaler and bring it with you  Escitalopram- Lexapro                                                                                                                            Carvedilol- Coreg                                                                                                                             Amlodipine- Norvasc                                                                                                                             Colchicine if needed  DO NOT TAKE ANY ORAL DIABETIC MEDICATIONS DAY OF YOUR SURGERY Glimepiride and Pioglitazone                                                                                                                                        Stop taking you Farxiga 3 days  before surgery 07/20/22  Bring CPAP mask and tubing day of surgery.                              You may not have any metal on your body including  jewelry, and body piercing  Do not wear lotions, powders, cologne, or deodorant                Men may shave face and neck.   Do not bring valuables to the hospital. [place].   Contacts, dentures or bridgework may not be worn into surgery.   Bring small overnight bag day of surgery.   DO NOT [place]. PHARMACY WILL DISPENSE MEDICATIONS LISTED ON YOUR MEDICATION LIST TO YOU DURING YOUR ADMISSION [place]!       Special Instructions: Bring a copy of your healthcare power of attorney and living will documents  the day of surgery if you haven't scanned them before.              Please read over the following fact sheets you were given: IF YOU  HAVE QUESTIONS ABOUT YOUR PRE-OP INSTRUCTIONS PLEASE CALL [phone]     [facility] - Preparing for Surgery Before surgery, you can play an important role.  Because skin is not sterile, your skin needs to be as free of germs as possible.  You can reduce the number of germs on your skin by washing with CHG (chlorahexidine gluconate) soap before surgery.  CHG is an antiseptic cleaner which kills germs and bonds with the skin to continue killing germs even after washing. Please DO NOT use if you have an allergy to CHG or antibacterial soaps.  If your skin becomes reddened/irritated stop using the CHG and inform your nurse when you arrive at Short Stay..  You may shave your face/neck. Please follow these instructions carefully:  1.  Shower with CHG Soap the night before surgery and the  morning of Surgery.  2.  If you choose to wash your hair, wash your hair first as usual with your  normal  shampoo.  3.  After you shampoo, rinse your hair and body thoroughly to remove the  shampoo.                            4.  Use CHG as you would any other liquid soap.  You can apply chg directly  to the skin and wash                       Gently with a scrungie or clean washcloth.  5.  Apply the CHG Soap to your body ONLY FROM THE NECK DOWN.   Do not use on face/ open                           Wound or open sores. Avoid contact with eyes, ears mouth and genitals (private parts).                       Wash face,  Genitals (private parts) with your normal soap.             6.  Wash thoroughly, paying special attention to the area where your surgery  will be performed.  7.  Thoroughly rinse your body with warm water from the neck down.  8.  DO NOT shower/wash with your normal soap after using and rinsing off  the CHG Soap.  9.  Pat yourself dry with a clean towel.            10.  Wear clean pajamas.            11.  Place clean sheets on your bed the night of your first shower and do not  sleep with  pets. Day of Surgery : Do not apply any lotions/deodorants the morning of surgery.  Please wear clean clothes to the hospital/surgery center.  FAILURE TO FOLLOW THESE INSTRUCTIONS MAY RESULT IN THE CANCELLATION OF YOUR SURGERY  ________________________________________________________________________   Incentive Spirometer  An incentive spirometer is a tool that can help keep your lungs clear and active. This tool measures how well you are filling your lungs with each breath. Taking long deep breaths may help reverse or decrease the chance of developing breathing (pulmonary) problems (especially infection) following: A long period of time when you are unable to move or be active. BEFORE THE PROCEDURE  If the spirometer includes an indicator to show your best effort, your nurse or respiratory therapist will set it to a desired goal. If possible, sit up straight or lean slightly forward. Try not to slouch. Hold the incentive spirometer in an upright position. INSTRUCTIONS FOR USE  Sit on the edge of your bed if possible, or sit up as far as you can in bed or on a chair. Hold the incentive spirometer in an upright position. Breathe out normally. Place the mouthpiece in your mouth and seal your lips tightly around it. Breathe in slowly and as deeply as possible, raising the piston or the ball toward the top of the column. Hold your breath for 3-5 seconds or for as long as possible. Allow the piston or ball to fall to the bottom of the column. Remove the mouthpiece from your mouth and breathe out normally. Rest for a few seconds and repeat Steps 1 through 7 at least 10 times every 1-2 hours when you are awake. Take your time and take a few normal breaths between deep breaths. The spirometer may include an indicator to show your best effort. Use the indicator as a goal to work toward during each repetition. After each set of 10 deep breaths, practice coughing to be sure your lungs are clear. If  you have an incision (the cut made at the time of surgery), support your incision when coughing by placing a pillow or rolled up towels firmly against it. Once you are able to get out of bed, walk around indoors and cough well. You may stop using the incentive spirometer when instructed by your caregiver.  RISKS AND COMPLICATIONS Take your time so you do not get dizzy or light-headed. If you are in pain, you may need to take or ask for pain medication before doing incentive spirometry. It is harder to take a deep breath if you are having pain. AFTER USE Rest and breathe slowly and easily. It can be helpful to keep track of a log of your progress. Your caregiver can provide you with a simple table to help with this. If you are using the spirometer at home, follow these instructions: SEEK MEDICAL CARE IF:  You are having difficultly using the spirometer. You have trouble using the spirometer as often as instructed. Your pain medication is not giving enough relief while using the spirometer. You develop fever of 100.5 F (38.1 C) or higher. SEEK IMMEDIATE MEDICAL CARE IF:  You cough up bloody sputum that had not been present before. You  develop fever of 102 F (38.9 C) or greater. You develop worsening pain at or near the incision site. MAKE SURE YOU:  Understand these instructions. Will watch your condition. Will get help right away if you are not doing well or get worse. Document Released: 01/20/2007 Document Revised: 12/02/2011 Document Reviewed: 03/23/2007 [facility] Patient Information 2014 [place], [place].   ________________________________________________________________________

## 2022-07-15 ENCOUNTER — Other Ambulatory Visit: Payer: Self-pay

## 2022-07-15 ENCOUNTER — Encounter (HOSPITAL_COMMUNITY): Payer: Self-pay

## 2022-07-15 ENCOUNTER — Encounter (HOSPITAL_COMMUNITY)
Admission: RE | Admit: 2022-07-15 | Discharge: 2022-07-15 | Disposition: A | Discharge status: Home / Self Care | Payer: BC Managed Care – PPO | Source: Ambulatory Visit | Attending: Orthopedic Surgery | Admitting: Orthopedic Surgery

## 2022-07-15 DIAGNOSIS — E119 Type 2 diabetes mellitus without complications: Secondary | ICD-10-CM | POA: Diagnosis not present

## 2022-07-15 DIAGNOSIS — Z01818 Encounter for other preprocedural examination: Secondary | ICD-10-CM | POA: Insufficient documentation

## 2022-07-15 HISTORY — DX: Gout, unspecified: M10.9

## 2022-07-15 LAB — CBC
HCT: 43.9 % (ref 39.0–52.0)
Hemoglobin: 13.3 g/dL (ref 13.0–17.0)
MCH: 27.8 pg (ref 26.0–34.0)
MCHC: 30.3 g/dL (ref 30.0–36.0)
MCV: 91.8 fL (ref 80.0–100.0)
Platelets: 185 10*3/uL (ref 150–400)
RBC: 4.78 MIL/uL (ref 4.22–5.81)
RDW: 15.5 % (ref 11.5–15.5)
WBC: 8.3 10*3/uL (ref 4.0–10.5)
nRBC: 0 % (ref 0.0–0.2)

## 2022-07-15 LAB — BASIC METABOLIC PANEL
Anion gap: 9 (ref 5–15)
BUN: 30 mg/dL — ABNORMAL HIGH (ref 8–23)
CO2: 27 mmol/L (ref 22–32)
Calcium: 9.6 mg/dL (ref 8.9–10.3)
Chloride: 103 mmol/L (ref 98–111)
Creatinine, Ser: 1.13 mg/dL (ref 0.61–1.24)
GFR, Estimated: >60 mL/min (ref >60)
Glucose, Bld: 105 mg/dL — ABNORMAL HIGH (ref 70–99)
Potassium: 3.9 mmol/L (ref 3.5–5.1)
Sodium: 139 mmol/L (ref 135–145)

## 2022-07-15 LAB — HEMOGLOBIN A1C
Hgb A1c MFr Bld: 6.9 % — ABNORMAL HIGH (ref 4.8–5.6)
Mean Plasma Glucose: 151.33 mg/dL

## 2022-07-15 LAB — SURGICAL PCR SCREEN
MRSA, PCR: NEGATIVE
Staphylococcus aureus: NEGATIVE

## 2022-07-15 LAB — GLUCOSE, CAPILLARY: Glucose-Capillary: 110 mg/dL — ABNORMAL HIGH (ref 70–99)

## 2022-07-15 NOTE — Progress Notes (Signed)
Anesthesia note:  Bowel prep reminder:NA  PCP - Archie Balboa DO  Cardiologist -Dr. Raechel Chute Other-   Chest x-ray - no EKG - 07/15/22-chart Stress Test - no ECHO - 2017 Cardiac Cath -   Pacemaker/ICD device last checked:NA  Sleep Study - yes CPAP - yes   Fasting Blood Sugar - 110-120 Checks Blood Sugar _QD____  Blood Thinner:ASA 81 Pt reports he doesn't take Jennye Moccasin but his cardiologist said to hold it 24 hours prior to DOS.  Blood Thinner Instructions: Aspirin Instructions:hold for 7 days Last Dose:07/16/22  Anesthesia review: yes  Patient denies shortness of breath, fever, cough and chest pain at PAT appointment Pt reports no SOB with activities unless he exerts himself.He had gout after is surgery in June and needed colchicine and a diuretic ,according to his wife ,to loose  fluid from his legs.  Patient verbalized understanding of instructions that were given to them at the PAT appointment. Patient was also instructed that they will need to review over the PAT instructions again at home before surgery. yes

## 2022-07-19 ENCOUNTER — Other Ambulatory Visit (HOSPITAL_COMMUNITY): Payer: BC Managed Care – PPO
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
## 2022-07-22 NOTE — Anesthesia Preprocedure Evaluation (Addendum)
Anesthesia Evaluation  Patient identified by MRN, date of birth, ID band Patient awake    Reviewed: Allergy & Precautions, NPO status , Patient's Chart, lab work & pertinent test results  Airway Mallampati: II  TM Distance: >3 FB Neck ROM: Full    Dental no notable dental hx. (+) Dental Advisory Given   Pulmonary sleep apnea and Continuous Positive Airway Pressure Ventilation ,    Pulmonary exam normal breath sounds clear to auscultation       Cardiovascular hypertension, Normal cardiovascular exam Rhythm:Regular Rate:Normal     Neuro/Psych    GI/Hepatic GERD  ,  Endo/Other  diabetes, Type 2Morbid obesity (BMI 37)  Renal/GU      Musculoskeletal   Abdominal   Peds  Hematology Lab Results      Component                Value               Date                           HGB                      13.3                07/15/2022                HCT                      43.9                07/15/2022                PLT                      185                 07/15/2022              Anesthesia Other Findings All Alpha Gal  Reproductive/Obstetrics                            Anesthesia Physical Anesthesia Plan  ASA: 3  Anesthesia Plan: Spinal   Post-op Pain Management: Regional block*   Induction:   PONV Risk Score and Plan: 2 and Treatment may vary due to age or medical condition, Midazolam and Ondansetron  Airway Management Planned: Nasal Cannula and Natural Airway  Additional Equipment: None  Intra-op Plan:   Post-operative Plan:   Informed Consent: I have reviewed the patients History and Physical, chart, labs and discussed the procedure including the risks, benefits and alternatives for the proposed anesthesia with the patient or authorized representative who has indicated his/her understanding and acceptance.     Dental advisory given  Plan Discussed with: CRNA and  Surgeon  Anesthesia Plan Comments: (Sp w R adductor)       Anesthesia Quick Evaluation

## 2022-07-23 ENCOUNTER — Other Ambulatory Visit: Payer: Self-pay

## 2022-07-23 ENCOUNTER — Encounter (HOSPITAL_COMMUNITY)
Admission: RE | Disposition: A | Discharge status: Home / Self Care | Payer: Self-pay | Source: Home / Self Care | Attending: Orthopedic Surgery

## 2022-07-23 ENCOUNTER — Ambulatory Visit (HOSPITAL_COMMUNITY): Payer: BC Managed Care – PPO | Admitting: Anesthesiology

## 2022-07-23 ENCOUNTER — Encounter (HOSPITAL_COMMUNITY): Payer: Self-pay | Admitting: Orthopedic Surgery

## 2022-07-23 ENCOUNTER — Observation Stay (HOSPITAL_COMMUNITY)
Admission: RE | Admit: 2022-07-23 | Discharge: 2022-07-24 | Disposition: A | Discharge status: Home / Self Care | Payer: BC Managed Care – PPO | Attending: Orthopedic Surgery | Admitting: Orthopedic Surgery

## 2022-07-23 ENCOUNTER — Ambulatory Visit (HOSPITAL_COMMUNITY): Payer: BC Managed Care – PPO | Admitting: Physician Assistant

## 2022-07-23 DIAGNOSIS — Z96652 Presence of left artificial knee joint: Secondary | ICD-10-CM | POA: Diagnosis not present

## 2022-07-23 DIAGNOSIS — E119 Type 2 diabetes mellitus without complications: Secondary | ICD-10-CM | POA: Insufficient documentation

## 2022-07-23 DIAGNOSIS — Z96651 Presence of right artificial knee joint: Secondary | ICD-10-CM

## 2022-07-23 DIAGNOSIS — I10 Essential (primary) hypertension: Secondary | ICD-10-CM | POA: Diagnosis not present

## 2022-07-23 DIAGNOSIS — Z79899 Other long term (current) drug therapy: Secondary | ICD-10-CM | POA: Diagnosis not present

## 2022-07-23 DIAGNOSIS — Z7982 Long term (current) use of aspirin: Secondary | ICD-10-CM | POA: Diagnosis not present

## 2022-07-23 DIAGNOSIS — Z01818 Encounter for other preprocedural examination: Secondary | ICD-10-CM

## 2022-07-23 DIAGNOSIS — M1711 Unilateral primary osteoarthritis, right knee: Secondary | ICD-10-CM | POA: Diagnosis present

## 2022-07-23 HISTORY — PX: TOTAL KNEE ARTHROPLASTY: SHX125

## 2022-07-23 LAB — GLUCOSE, CAPILLARY
Glucose-Capillary: 138 mg/dL — ABNORMAL HIGH (ref 70–99)
Glucose-Capillary: 150 mg/dL — ABNORMAL HIGH (ref 70–99)
Glucose-Capillary: 300 mg/dL — ABNORMAL HIGH (ref 70–99)

## 2022-07-23 SURGERY — ARTHROPLASTY, KNEE, TOTAL
Anesthesia: Spinal | Site: Knee | Laterality: Right

## 2022-07-23 MED ORDER — SENNA 8.6 MG PO TABS
2.0000 | ORAL_TABLET | Freq: Every day | ORAL | Status: DC
  Administered 2022-07-23: 17.2 mg via ORAL
  Filled 2022-07-23: qty 2

## 2022-07-23 MED ORDER — ACETAMINOPHEN 10 MG/ML IV SOLN
INTRAVENOUS | Status: AC
  Administered 2022-07-23: 1000 mg via INTRAVENOUS
  Filled 2022-07-23: qty 100

## 2022-07-23 MED ORDER — AMLODIPINE BESYLATE 5 MG PO TABS
5.0000 mg | ORAL_TABLET | Freq: Every day | ORAL | Status: DC
  Administered 2022-07-24: 5 mg via ORAL
  Filled 2022-07-23: qty 1

## 2022-07-23 MED ORDER — INSULIN ASPART 100 UNIT/ML IJ SOLN
0.0000 [IU] | Freq: Three times a day (TID) | INTRAMUSCULAR | Status: DC
  Administered 2022-07-24: 3 [IU] via SUBCUTANEOUS

## 2022-07-23 MED ORDER — DIPHENHYDRAMINE HCL 12.5 MG/5ML PO ELIX: 12.5000 mg | ORAL_SOLUTION | ORAL | Status: DC | PRN

## 2022-07-23 MED ORDER — 0.9 % SODIUM CHLORIDE (POUR BTL) OPTIME
TOPICAL | Status: DC | PRN
  Administered 2022-07-23: 1000 mL

## 2022-07-23 MED ORDER — CARVEDILOL 6.25 MG PO TABS
6.2500 mg | ORAL_TABLET | Freq: Two times a day (BID) | ORAL | Status: DC
  Administered 2022-07-23 – 2022-07-24 (×2): 6.25 mg via ORAL
  Filled 2022-07-23 (×2): qty 1

## 2022-07-23 MED ORDER — METHOCARBAMOL 500 MG IVPB - SIMPLE MED: 500.0000 mg | Freq: Four times a day (QID) | INTRAVENOUS | Status: DC | PRN

## 2022-07-23 MED ORDER — KETOROLAC TROMETHAMINE 30 MG/ML IJ SOLN
INTRAMUSCULAR | Status: DC | PRN
  Administered 2022-07-23: 30 mg

## 2022-07-23 MED ORDER — DEXAMETHASONE SODIUM PHOSPHATE 10 MG/ML IJ SOLN
10.0000 mg | Freq: Once | INTRAMUSCULAR | Status: AC
  Administered 2022-07-24: 10 mg via INTRAVENOUS
  Filled 2022-07-23: qty 1

## 2022-07-23 MED ORDER — OXYCODONE HCL 5 MG PO TABS
10.0000 mg | ORAL_TABLET | ORAL | Status: DC | PRN
  Administered 2022-07-23 – 2022-07-24 (×2): 10 mg via ORAL
  Filled 2022-07-23: qty 2

## 2022-07-23 MED ORDER — ACETAMINOPHEN 500 MG PO TABS
1000.0000 mg | ORAL_TABLET | Freq: Four times a day (QID) | ORAL | Status: DC
  Administered 2022-07-24 (×2): 1000 mg via ORAL
  Filled 2022-07-23 (×2): qty 2

## 2022-07-23 MED ORDER — MEPIVACAINE HCL (PF) 2 % IJ SOLN
INTRAMUSCULAR | Status: DC | PRN
  Administered 2022-07-23: 3 mL via INTRATHECAL

## 2022-07-23 MED ORDER — ONDANSETRON HCL 4 MG/2ML IJ SOLN: 4.0000 mg | Freq: Four times a day (QID) | INTRAMUSCULAR | Status: DC | PRN

## 2022-07-23 MED ORDER — FENTANYL CITRATE PF 50 MCG/ML IJ SOSY
PREFILLED_SYRINGE | INTRAMUSCULAR | Status: AC
  Administered 2022-07-23: 50 ug via INTRAVENOUS
  Filled 2022-07-23: qty 2

## 2022-07-23 MED ORDER — SODIUM CHLORIDE (PF) 0.9 % IJ SOLN
INTRAMUSCULAR | Status: AC
  Filled 2022-07-23: qty 30

## 2022-07-23 MED ORDER — DEXAMETHASONE SODIUM PHOSPHATE 10 MG/ML IJ SOLN
INTRAMUSCULAR | Status: DC | PRN
  Administered 2022-07-23: 8 mg via INTRAVENOUS

## 2022-07-23 MED ORDER — LACTATED RINGERS IV SOLN: INTRAVENOUS | Status: DC

## 2022-07-23 MED ORDER — CEFAZOLIN SODIUM-DEXTROSE 2-4 GM/100ML-% IV SOLN
2.0000 g | INTRAVENOUS | Status: AC
  Administered 2022-07-23: 2 g via INTRAVENOUS
  Filled 2022-07-23: qty 100

## 2022-07-23 MED ORDER — LISINOPRIL 20 MG PO TABS
40.0000 mg | ORAL_TABLET | Freq: Every day | ORAL | Status: DC
  Administered 2022-07-24: 40 mg via ORAL
  Filled 2022-07-23: qty 2

## 2022-07-23 MED ORDER — METOCLOPRAMIDE HCL 5 MG PO TABS: 5.0000 mg | ORAL_TABLET | Freq: Three times a day (TID) | ORAL | Status: DC | PRN

## 2022-07-23 MED ORDER — MENTHOL 3 MG MT LOZG: 1.0000 | LOZENGE | OROMUCOSAL | Status: DC | PRN

## 2022-07-23 MED ORDER — ONDANSETRON HCL 4 MG/2ML IJ SOLN
INTRAMUSCULAR | Status: DC | PRN
  Administered 2022-07-23: 4 mg via INTRAVENOUS

## 2022-07-23 MED ORDER — COLCHICINE 0.6 MG PO TABS
0.6000 mg | ORAL_TABLET | Freq: Every day | ORAL | Status: DC
  Filled 2022-07-23: qty 1

## 2022-07-23 MED ORDER — ASPIRIN 81 MG PO CHEW
81.0000 mg | CHEWABLE_TABLET | Freq: Two times a day (BID) | ORAL | Status: DC
  Administered 2022-07-23: 81 mg via ORAL
  Filled 2022-07-23: qty 1

## 2022-07-23 MED ORDER — METHOCARBAMOL 500 MG PO TABS: 500.0000 mg | ORAL_TABLET | Freq: Four times a day (QID) | ORAL | Status: DC | PRN

## 2022-07-23 MED ORDER — BUPIVACAINE-EPINEPHRINE (PF) 0.5% -1:200000 IJ SOLN
INTRAMUSCULAR | Status: AC
  Filled 2022-07-23: qty 30

## 2022-07-23 MED ORDER — LIDOCAINE HCL (CARDIAC) PF 100 MG/5ML IV SOSY
PREFILLED_SYRINGE | INTRAVENOUS | Status: DC | PRN
  Administered 2022-07-23: 20 mg via INTRAVENOUS

## 2022-07-23 MED ORDER — FENTANYL CITRATE PF 50 MCG/ML IJ SOSY: 25.0000 ug | PREFILLED_SYRINGE | INTRAMUSCULAR | Status: DC | PRN

## 2022-07-23 MED ORDER — ACETAMINOPHEN 325 MG PO TABS: 325.0000 mg | ORAL_TABLET | Freq: Four times a day (QID) | ORAL | Status: DC | PRN

## 2022-07-23 MED ORDER — TRANEXAMIC ACID-NACL 1000-0.7 MG/100ML-% IV SOLN
1000.0000 mg | INTRAVENOUS | Status: AC
  Administered 2022-07-23: 1000 mg via INTRAVENOUS
  Filled 2022-07-23: qty 100

## 2022-07-23 MED ORDER — KETOROLAC TROMETHAMINE 30 MG/ML IJ SOLN
INTRAMUSCULAR | Status: AC
  Filled 2022-07-23: qty 1

## 2022-07-23 MED ORDER — ROPIVACAINE HCL 5 MG/ML IJ SOLN
INTRAMUSCULAR | Status: DC | PRN
  Administered 2022-07-23: 30 mL via PERINEURAL

## 2022-07-23 MED ORDER — DAPAGLIFLOZIN PROPANEDIOL 10 MG PO TABS
10.0000 mg | ORAL_TABLET | Freq: Every day | ORAL | Status: DC
  Administered 2022-07-24: 10 mg via ORAL
  Filled 2022-07-23: qty 1

## 2022-07-23 MED ORDER — POVIDONE-IODINE 10 % EX SWAB
2.0000 | Freq: Once | CUTANEOUS | Status: AC
  Administered 2022-07-23: 2 via TOPICAL

## 2022-07-23 MED ORDER — POLYETHYLENE GLYCOL 3350 17 G PO PACK
17.0000 g | PACK | Freq: Every day | ORAL | Status: DC
  Administered 2022-07-23 – 2022-07-24 (×2): 17 g via ORAL
  Filled 2022-07-23 (×2): qty 1

## 2022-07-23 MED ORDER — PHENOL 1.4 % MT LIQD: 1.0000 | OROMUCOSAL | Status: DC | PRN

## 2022-07-23 MED ORDER — CHLORTHALIDONE 25 MG PO TABS
25.0000 mg | ORAL_TABLET | Freq: Every morning | ORAL | Status: DC
  Administered 2022-07-24: 25 mg via ORAL
  Filled 2022-07-23: qty 1

## 2022-07-23 MED ORDER — METOCLOPRAMIDE HCL 5 MG/ML IJ SOLN: 5.0000 mg | Freq: Three times a day (TID) | INTRAMUSCULAR | Status: DC | PRN

## 2022-07-23 MED ORDER — FERROUS SULFATE 325 (65 FE) MG PO TABS
325.0000 mg | ORAL_TABLET | Freq: Three times a day (TID) | ORAL | Status: DC
  Administered 2022-07-23 – 2022-07-24 (×2): 325 mg via ORAL
  Filled 2022-07-23 (×2): qty 1

## 2022-07-23 MED ORDER — CHLORHEXIDINE GLUCONATE 0.12 % MT SOLN
15.0000 mL | Freq: Once | OROMUCOSAL | Status: AC
  Administered 2022-07-23: 15 mL via OROMUCOSAL

## 2022-07-23 MED ORDER — BREXPIPRAZOLE 1 MG PO TABS
1.0000 mg | ORAL_TABLET | Freq: Every day | ORAL | Status: DC
  Administered 2022-07-23 – 2022-07-24 (×2): 1 mg via ORAL
  Filled 2022-07-23 (×2): qty 1

## 2022-07-23 MED ORDER — OXYCODONE HCL 5 MG PO TABS
5.0000 mg | ORAL_TABLET | ORAL | Status: DC | PRN
  Filled 2022-07-23 (×2): qty 2

## 2022-07-23 MED ORDER — BUPIVACAINE-EPINEPHRINE (PF) 0.5% -1:200000 IJ SOLN
INTRAMUSCULAR | Status: DC | PRN
  Administered 2022-07-23: 30 mL

## 2022-07-23 MED ORDER — HYDROMORPHONE HCL 1 MG/ML IJ SOLN: 0.5000 mg | INTRAMUSCULAR | Status: DC | PRN

## 2022-07-23 MED ORDER — CEFAZOLIN SODIUM-DEXTROSE 2-4 GM/100ML-% IV SOLN
2.0000 g | Freq: Four times a day (QID) | INTRAVENOUS | Status: AC
  Administered 2022-07-23 – 2022-07-24 (×2): 2 g via INTRAVENOUS
  Filled 2022-07-23 (×2): qty 100

## 2022-07-23 MED ORDER — CLONIDINE HCL (ANALGESIA) 100 MCG/ML EP SOLN
EPIDURAL | Status: DC | PRN
  Administered 2022-07-23: 100 ug

## 2022-07-23 MED ORDER — SODIUM CHLORIDE 0.9 % IR SOLN
Status: DC | PRN
  Administered 2022-07-23: 1000 mL

## 2022-07-23 MED ORDER — ACETAMINOPHEN 10 MG/ML IV SOLN: 1000.0000 mg | Freq: Once | INTRAVENOUS | Status: DC | PRN

## 2022-07-23 MED ORDER — ALBUTEROL SULFATE (2.5 MG/3ML) 0.083% IN NEBU: 3.0000 mL | INHALATION_SOLUTION | Freq: Four times a day (QID) | RESPIRATORY_TRACT | Status: DC | PRN

## 2022-07-23 MED ORDER — SODIUM CHLORIDE (PF) 0.9 % IJ SOLN
INTRAMUSCULAR | Status: DC | PRN
  Administered 2022-07-23: 30 mL

## 2022-07-23 MED ORDER — ONDANSETRON HCL 4 MG PO TABS: 4.0000 mg | ORAL_TABLET | Freq: Four times a day (QID) | ORAL | Status: DC | PRN

## 2022-07-23 MED ORDER — STERILE WATER FOR IRRIGATION IR SOLN
Status: DC | PRN
  Administered 2022-07-23: 2000 mL

## 2022-07-23 MED ORDER — ESCITALOPRAM OXALATE 10 MG PO TABS
10.0000 mg | ORAL_TABLET | Freq: Every day | ORAL | Status: DC
  Administered 2022-07-24: 10 mg via ORAL
  Filled 2022-07-23: qty 1

## 2022-07-23 MED ORDER — PROPOFOL 10 MG/ML IV BOLUS
INTRAVENOUS | Status: DC | PRN
  Administered 2022-07-23: 20 mg via INTRAVENOUS
  Administered 2022-07-23 (×3): 10 mg via INTRAVENOUS
  Administered 2022-07-23: 20 mg via INTRAVENOUS

## 2022-07-23 MED ORDER — PIOGLITAZONE HCL 30 MG PO TABS
30.0000 mg | ORAL_TABLET | Freq: Every day | ORAL | Status: DC
  Administered 2022-07-24: 30 mg via ORAL
  Filled 2022-07-23: qty 1

## 2022-07-23 MED ORDER — ORAL CARE MOUTH RINSE: 15.0000 mL | Freq: Once | OROMUCOSAL | Status: AC

## 2022-07-23 MED ORDER — TRANEXAMIC ACID-NACL 1000-0.7 MG/100ML-% IV SOLN
1000.0000 mg | Freq: Once | INTRAVENOUS | Status: AC
  Administered 2022-07-23: 1000 mg via INTRAVENOUS
  Filled 2022-07-23: qty 100

## 2022-07-23 MED ORDER — FENTANYL CITRATE PF 50 MCG/ML IJ SOSY: 50.0000 ug | PREFILLED_SYRINGE | INTRAMUSCULAR | Status: AC

## 2022-07-23 MED ORDER — GLIMEPIRIDE 2 MG PO TABS
2.0000 mg | ORAL_TABLET | Freq: Every morning | ORAL | Status: DC
  Administered 2022-07-24: 2 mg via ORAL
  Filled 2022-07-23: qty 1

## 2022-07-23 MED ORDER — BISACODYL 10 MG RE SUPP: 10.0000 mg | Freq: Every day | RECTAL | Status: DC | PRN

## 2022-07-23 MED ORDER — SODIUM CHLORIDE 0.9 % IV SOLN: INTRAVENOUS | Status: DC

## 2022-07-23 MED ORDER — ONDANSETRON HCL 4 MG/2ML IJ SOLN: 4.0000 mg | Freq: Once | INTRAMUSCULAR | Status: DC | PRN

## 2022-07-23 MED ORDER — DEXAMETHASONE SODIUM PHOSPHATE 10 MG/ML IJ SOLN: 8.0000 mg | Freq: Once | INTRAMUSCULAR | Status: DC

## 2022-07-23 MED ORDER — PROPOFOL 500 MG/50ML IV EMUL
INTRAVENOUS | Status: DC | PRN
  Administered 2022-07-23: 80 ug/kg/min via INTRAVENOUS

## 2022-07-23 MED ORDER — METHOCARBAMOL 500 MG IVPB - SIMPLE MED
INTRAVENOUS | Status: AC
  Administered 2022-07-23: 500 mg via INTRAVENOUS
  Filled 2022-07-23: qty 55

## 2022-07-23 SURGICAL SUPPLY — 49 items
ATTUNE MED ANAT PAT 41 KNEE (Knees) IMPLANT
ATTUNE PS FEM RT SZ 5 CEM KNEE (Femur) IMPLANT
ATTUNE PSRP INSR SZ5 6 KNEE (Insert) IMPLANT
BAG COUNTER SPONGE SURGICOUNT (BAG) IMPLANT
BAG ZIPLOCK 12X15 (MISCELLANEOUS) IMPLANT
BASE TIBIA ATTUNE KNEE SYS SZ6 (Knees) IMPLANT
BLADE SAW SGTL 11.0X1.19X90.0M (BLADE) IMPLANT
BLADE SAW SGTL 13.0X1.19X90.0M (BLADE) ×1 IMPLANT
BNDG ELASTIC 6X5.8 VLCR STR LF (GAUZE/BANDAGES/DRESSINGS) ×1 IMPLANT
BOWL SMART MIX CTS (DISPOSABLE) ×1 IMPLANT
CEMENT HV SMART SET (Cement) IMPLANT
CUFF TOURN SGL QUICK 34 (TOURNIQUET CUFF) ×1
CUFF TRNQT CYL 34X4.125X (TOURNIQUET CUFF) ×1 IMPLANT
DERMABOND ADVANCED .7 DNX12 (GAUZE/BANDAGES/DRESSINGS) ×1 IMPLANT
DRAPE U-SHAPE 47X51 STRL (DRAPES) ×1 IMPLANT
DRESSING AQUACEL AG SP 3.5X10 (GAUZE/BANDAGES/DRESSINGS) ×1 IMPLANT
DRSG AQUACEL AG SP 3.5X10 (GAUZE/BANDAGES/DRESSINGS) ×1
DURAPREP 26ML APPLICATOR (WOUND CARE) ×2 IMPLANT
ELECT REM PT RETURN 15FT ADLT (MISCELLANEOUS) ×1 IMPLANT
GLOVE BIO SURGEON STRL SZ 6 (GLOVE) ×1 IMPLANT
GLOVE BIOGEL PI IND STRL 6.5 (GLOVE) ×1 IMPLANT
GLOVE BIOGEL PI IND STRL 7.5 (GLOVE) ×1 IMPLANT
GLOVE ORTHO TXT STRL SZ7.5 (GLOVE) ×2 IMPLANT
GOWN STRL REUS W/ TWL LRG LVL3 (GOWN DISPOSABLE) ×2 IMPLANT
GOWN STRL REUS W/TWL LRG LVL3 (GOWN DISPOSABLE) ×2
HANDPIECE INTERPULSE COAX TIP (DISPOSABLE) ×1
HOLDER FOLEY CATH W/STRAP (MISCELLANEOUS) IMPLANT
KIT TURNOVER KIT A (KITS) IMPLANT
MANIFOLD NEPTUNE II (INSTRUMENTS) ×1 IMPLANT
NDL SAFETY ECLIP 18X1.5 (MISCELLANEOUS) IMPLANT
NS IRRIG 1000ML POUR BTL (IV SOLUTION) ×1 IMPLANT
PACK TOTAL KNEE CUSTOM (KITS) ×1 IMPLANT
PIN FIX SIGMA LCS THRD HI (PIN) IMPLANT
PROTECTOR NERVE ULNAR (MISCELLANEOUS) ×1 IMPLANT
SET HNDPC FAN SPRY TIP SCT (DISPOSABLE) ×1 IMPLANT
SET PAD KNEE POSITIONER (MISCELLANEOUS) ×1 IMPLANT
SPIKE FLUID TRANSFER (MISCELLANEOUS) ×2 IMPLANT
SUT MNCRL AB 4-0 PS2 18 (SUTURE) ×1 IMPLANT
SUT STRATAFIX PDS+ 0 24IN (SUTURE) ×1 IMPLANT
SUT VIC AB 1 CT1 36 (SUTURE) ×1 IMPLANT
SUT VIC AB 2-0 CT1 27 (SUTURE) ×2
SUT VIC AB 2-0 CT1 TAPERPNT 27 (SUTURE) ×2 IMPLANT
SYR 3ML LL SCALE MARK (SYRINGE) ×1 IMPLANT
TIBIA ATTUNE KNEE SYS BASE SZ6 (Knees) ×1 IMPLANT
TOWEL GREEN STERILE FF (TOWEL DISPOSABLE) ×1 IMPLANT
TRAY FOLEY MTR SLVR 16FR STAT (SET/KITS/TRAYS/PACK) ×1 IMPLANT
TUBE SUCTION HIGH CAP CLEAR NV (SUCTIONS) ×1 IMPLANT
WATER STERILE IRR 1000ML POUR (IV SOLUTION) ×2 IMPLANT
WRAP KNEE MAXI GEL POST OP (GAUZE/BANDAGES/DRESSINGS) ×1 IMPLANT

## 2022-07-23 NOTE — Anesthesia Procedure Notes (Signed)
Anesthesia Regional Block: Adductor canal block   Pre-Anesthetic Checklist: , timeout performed,  Correct Patient, Correct Site, Correct Laterality,  Correct Procedure, Correct Position, site marked,  Risks and benefits discussed,  Surgical consent,  Pre-op evaluation,  At surgeon's request and post-op pain management  Laterality: Lower and Right  Prep: chloraprep       Needles:  Injection technique: Single-shot  Needle Type: Echogenic Needle     Needle Length: 9cm  Needle Gauge: 22     Additional Needles:   Procedures:,,,, ultrasound used (permanent image in chart),,    Narrative:  Start time: 07/23/2022 2:25 PM End time: 07/23/2022 2:32 PM Injection made incrementally with aspirations every 5 mL.  Performed by: Personally  Anesthesiologist: Barnet Glasgow, MD  Additional Notes: Block assessed prior to surgery. Pt tolerated procedure well.

## 2022-07-23 NOTE — Discharge Instructions (Signed)

## 2022-07-23 NOTE — Interval H&P Note (Signed)
History and Physical Interval Note:  07/23/2022 2:13 PM  Noah Hampton  has presented today for surgery, with the diagnosis of Right knee osteoarthritis.  The various methods of treatment have been discussed with the patient and family. After consideration of risks, benefits and other options for treatment, the patient has consented to  Procedure(s): TOTAL KNEE ARTHROPLASTY (Right) as a surgical intervention.  The patient's history has been reviewed, patient examined, no change in status, stable for surgery.  I have reviewed the patient's chart and labs.  Questions were answered to the patient's satisfaction.     Mauri Pole

## 2022-07-23 NOTE — Anesthesia Postprocedure Evaluation (Signed)
Anesthesia Post Note  Patient: Noah Hampton  Procedure(s) Performed: TOTAL KNEE ARTHROPLASTY (Right: Knee)     Patient location during evaluation: Nursing Unit Anesthesia Type: Spinal Level of consciousness: oriented and awake and alert Pain management: pain level controlled Vital Signs Assessment: post-procedure vital signs reviewed and stable Respiratory status: spontaneous breathing and respiratory function stable Cardiovascular status: blood pressure returned to baseline and stable Postop Assessment: no headache, no backache, no apparent nausea or vomiting and patient able to bend at knees Anesthetic complications: no   No notable events documented.  Last Vitals:  Vitals:   07/23/22 1815 07/23/22 1837  BP: (!) 150/74 (!) 176/85  Pulse: (!) 59 (!) 53  Resp: (!) 24 16  Temp: 36.4 C (!) 36.4 C  SpO2: 100% 98%    Last Pain:  Vitals:   07/23/22 1837  TempSrc: Oral  PainSc:                  Barnet Glasgow

## 2022-07-23 NOTE — Transfer of Care (Signed)
Immediate Anesthesia Transfer of Care Note  Patient: Noah Hampton  Procedure(s) Performed: TOTAL KNEE ARTHROPLASTY (Right: Knee)  Patient Location: PACU  Anesthesia Type:Spinal  Level of Consciousness: drowsy  Airway & Oxygen Therapy: Patient Spontanous Breathing and Patient connected to face mask oxygen  Post-op Assessment: Report given to RN and Post -op Vital signs reviewed and stable  Post vital signs: Reviewed and stable  Last Vitals:  Vitals Value Taken Time  BP 137/64   Temp    Pulse 67 07/23/22 1721  Resp 19 07/23/22 1721  SpO2 98 % 07/23/22 1721  Vitals shown include unvalidated device data.  Last Pain:  Vitals:   07/23/22 1337  TempSrc: Oral  PainSc:       Patients Stated Pain Goal: 4 (27/51/70 0174)  Complications: No notable events documented.

## 2022-07-23 NOTE — Op Note (Signed)
NAME:  Noah Hampton                      MEDICAL RECORD NO.:  [id number]                             FACILITY:  [facility]      PHYSICIAN:  Pietro Cassis. Alvan Dame, M.D.  DATE OF BIRTH:  1951/03/19      DATE OF PROCEDURE:  07/23/2022                                     OPERATIVE REPORT         PREOPERATIVE DIAGNOSIS:  Right knee osteoarthritis.      POSTOPERATIVE DIAGNOSIS:  Right knee osteoarthritis.      FINDINGS:  The patient was noted to have complete loss of cartilage and   bone-on-bone arthritis with associated osteophytes in the medial and patellofemoral compartments of   the knee with a significant synovitis and associated effusion.  The patient had failed months of conservative treatment including medications, injection therapy, activity modification.     PROCEDURE:  Right total knee replacement.      COMPONENTS USED:  DePuy Attune rotating platform posterior stabilized knee   system, a size 5 femur, 6 tibia, size 6 mm PS AOX insert, and 41 anatomic patellar   button.      SURGEON:  Pietro Cassis. Alvan Dame, M.D.      ASSISTANT:  Costella Hatcher, PA-C.      ANESTHESIA:  Regional and Spinal.      SPECIMENS:  None.      COMPLICATION:  None.      DRAINS:  None.  EBL: <200 cc      TOURNIQUET TIME:  34 min at 225 mmHg     The patient was stable to the recovery room.      INDICATION FOR PROCEDURE:  Noah Hampton is a 71 y.o. male patient of   mine.  The patient had been seen, evaluated, and treated for months conservatively in the   office with medication, activity modification, and injections.  The patient had   radiographic changes of bone-on-bone arthritis with endplate sclerosis and osteophytes noted.  Based on the radiographic changes and failed conservative measures, the patient   decided to proceed with definitive treatment, total knee replacement.  Risks of infection, DVT, component failure, need for revision surgery, neurovascular injury were reviewed in the office setting.  The  postop course was reviewed stressing the efforts to maximize post-operative satisfaction and function.  Consent was obtained for benefit of pain   relief.      PROCEDURE IN DETAIL:  The patient was brought to the operative theater.   Once adequate anesthesia, preoperative antibiotics, 2 gm of Ancef,1 gm of Tranexamic Acid, and 10 mg of Decadron administered, the patient was positioned supine with a right thigh tourniquet placed.  The  right lower extremity was prepped and draped in sterile fashion.  A time-   out was performed identifying the patient, planned procedure, and the appropriate extremity.      The right lower extremity was placed in the [facility] leg holder.  The leg was   exsanguinated, tourniquet elevated to 250 mmHg.  A midline incision was   made followed by median parapatellar arthrotomy.  Following initial   exposure, attention was first directed  to the patella.  Precut   measurement was noted to be 26 mm.  I resected down to 14-15 mm and used a   41 anatomic patellar button to restore patellar height as well as cover the cut surface.      The lug holes were drilled and a metal shim was placed to protect the   patella from retractors and saw blade during the procedure.      At this point, attention was now directed to the femur.  The femoral   canal was opened with a drill, irrigated to try to prevent fat emboli.  An   intramedullary rod was passed at 5 degrees valgus, 9 mm of bone was   resected off the distal femur.  Following this resection, the tibia was   subluxated anteriorly.  Using the extramedullary guide, 2 mm of bone was resected off   the proximal medial tibia.  We confirmed the gap would be   stable medially and laterally with a size 5 spacer block as well as confirmed that the tibial cut was perpendicular in the coronal plane, checking with an alignment rod.      Once this was done, I sized the femur to be a size 5 in the anterior-   posterior dimension, chose  a standard component based on medial and   lateral dimension.  The size 5 rotation block was then pinned in   position anterior referenced using the C-clamp to set rotation.  The   anterior, posterior, and  chamfer cuts were made without difficulty nor   notching making certain that I was along the anterior cortex to help   with flexion gap stability.      The final box cut was made off the lateral aspect of distal femur.      At this point, the tibia was sized to be a size 6.  The size 6 tray was   then pinned in position through the medial third of the tubercle,   drilled, and keel punched.  Trial reduction was now carried with a 5 femur,  6 tibia, a size 6 mm PS insert, and the 41 anatomic patella botton.  The knee was brought to full extension with good flexion stability with the patella   tracking through the trochlea without application of pressure.  Given   all these findings the trial components removed.  Final components were   opened and cement was mixed.  The knee was irrigated with normal saline solution and pulse lavage.  The synovial lining was   then injected with 30 cc of 0.25% Marcaine with epinephrine, 1 cc of Toradol and 30 cc of NS for a total of 61 cc.     Final implants were then cemented onto cleaned and dried cut surfaces of bone with the knee brought to extension with a size 6 mm PS trial insert.      Once the cement had fully cured, excess cement was removed   throughout the knee.  I confirmed that I was satisfied with the range of   motion and stability, and the final size 6 mm PS AOX insert was chosen.  It was   placed into the knee.      The tourniquet had been let down at 34 minutes.  No significant   hemostasis was required.  The extensor mechanism was then reapproximated using #1 Vicryl and #1 Stratafix sutures with the knee   in flexion.  The   remaining  wound was closed with 2-0 Vicryl and running 4-0 Monocryl.   The knee was cleaned, dried, dressed  sterilely using Dermabond and   Aquacel dressing.  The patient was then   brought to recovery room in stable condition, tolerating the procedure   well.   Please note that Physician Assistant, Rosalene Billings, PA-C was present for the entirety of the case, and was utilized for pre-operative positioning, peri-operative retractor management, general facilitation of the procedure and for primary wound closure at the end of the case.              Madlyn Frankel Charlann Boxer, M.D.    07/23/2022 2:13 PM

## 2022-07-23 NOTE — Anesthesia Procedure Notes (Signed)
Spinal  Patient location during procedure: OR Start time: 07/23/2022 3:28 PM End time: 07/23/2022 3:33 PM Reason for block: surgical anesthesia Staffing Performed: resident/CRNA  Anesthesiologist: Barnet Glasgow, MD Resident/CRNA: Raenette Rover, CRNA Performed by: Raenette Rover, CRNA Authorized by: Barnet Glasgow, MD   Preanesthetic Checklist Completed: patient identified, IV checked, site marked, risks and benefits discussed, surgical consent, monitors and equipment checked, pre-op evaluation and timeout performed Spinal Block Patient position: sitting Prep: DuraPrep Patient monitoring: blood pressure, continuous pulse ox and heart rate Approach: midline Location: L3-4 Injection technique: single-shot Needle Needle type: Pencan  Needle gauge: 24 G Assessment Sensory level: T6 Events: CSF return

## 2022-07-24 ENCOUNTER — Encounter (HOSPITAL_COMMUNITY): Payer: Self-pay | Admitting: Orthopedic Surgery

## 2022-07-24 DIAGNOSIS — M1711 Unilateral primary osteoarthritis, right knee: Secondary | ICD-10-CM | POA: Diagnosis not present

## 2022-07-24 LAB — BASIC METABOLIC PANEL
Anion gap: 7 (ref 5–15)
BUN: 25 mg/dL — ABNORMAL HIGH (ref 8–23)
CO2: 26 mmol/L (ref 22–32)
Calcium: 8.9 mg/dL (ref 8.9–10.3)
Chloride: 107 mmol/L (ref 98–111)
Creatinine, Ser: 1.08 mg/dL (ref 0.61–1.24)
GFR, Estimated: >60 mL/min (ref >60)
Glucose, Bld: 220 mg/dL — ABNORMAL HIGH (ref 70–99)
Potassium: 4.1 mmol/L (ref 3.5–5.1)
Sodium: 140 mmol/L (ref 135–145)

## 2022-07-24 LAB — CBC
HCT: 39.8 % (ref 39.0–52.0)
Hemoglobin: 12.2 g/dL — ABNORMAL LOW (ref 13.0–17.0)
MCH: 28.2 pg (ref 26.0–34.0)
MCHC: 30.7 g/dL (ref 30.0–36.0)
MCV: 91.9 fL (ref 80.0–100.0)
Platelets: 163 10*3/uL (ref 150–400)
RBC: 4.33 MIL/uL (ref 4.22–5.81)
RDW: 14.7 % (ref 11.5–15.5)
WBC: 9.7 10*3/uL (ref 4.0–10.5)
nRBC: 0 % (ref 0.0–0.2)

## 2022-07-24 LAB — GLUCOSE, CAPILLARY: Glucose-Capillary: 152 mg/dL — ABNORMAL HIGH (ref 70–99)

## 2022-07-24 MED ORDER — METHOCARBAMOL 500 MG PO TABS: 500.0000 mg | ORAL_TABLET | Freq: Four times a day (QID) | ORAL | 2 refills | Status: DC | PRN

## 2022-07-24 MED ORDER — RIVAROXABAN 10 MG PO TABS
10.0000 mg | ORAL_TABLET | Freq: Every day | ORAL | Status: DC
  Administered 2022-07-24: 10 mg via ORAL
  Filled 2022-07-24: qty 1

## 2022-07-24 MED ORDER — RIVAROXABAN 10 MG PO TABS: 10.0000 mg | ORAL_TABLET | Freq: Every day | ORAL | 0 refills | Status: DC

## 2022-07-24 MED ORDER — SENNA 8.6 MG PO TABS: 2.0000 | ORAL_TABLET | Freq: Every day | ORAL | 0 refills | Status: DC

## 2022-07-24 MED ORDER — OXYCODONE HCL 5 MG PO TABS: 5.0000 mg | ORAL_TABLET | ORAL | 0 refills | Status: DC | PRN

## 2022-07-24 MED ORDER — POLYETHYLENE GLYCOL 3350 17 G PO PACK: 17.0000 g | PACK | Freq: Every day | ORAL | 0 refills | Status: DC

## 2022-07-24 NOTE — Evaluation (Signed)
Physical Therapy Evaluation Patient Details Name: Noah Hampton MRN: [id number] DOB: 12-15-1950 Today's Date: 07/24/2022  History of Present Illness  71 yo male s/p  R TKA on 07/23/22. PMH: DM, HTN, anxiety, obesity, L TKA  Clinical Impression  Pt is s/p TKA resulting in the deficits listed below (see PT Problem List).  PT is doing quite well today. He is familiar with HEP and techniques from recent L TKA. Wife present for session, they feel ready to d/c home   Pt will benefit from skilled PT to increase their independence and safety with mobility to allow discharge to the venue listed below.         Recommendations for follow up therapy are one component of a multi-disciplinary discharge planning process, led by the attending physician.  Recommendations may be updated based on patient status, additional functional criteria and insurance authorization.  Follow Up Recommendations Follow physician's recommendations for discharge plan and follow up therapies      Assistance Recommended at Discharge Intermittent Supervision/Assistance  Patient can return home with the following  Help with stairs or ramp for entrance;Assist for transportation;Assistance with cooking/housework    Equipment Recommendations None recommended by PT  Recommendations for Other Services       Functional Status Assessment Patient has had a recent decline in their functional status and demonstrates the ability to make significant improvements in function in a reasonable and predictable amount of time.     Precautions / Restrictions Precautions Precautions: Fall Restrictions Weight Bearing Restrictions: No Other Position/Activity Restrictions: WBAT      Mobility  Bed Mobility               General bed mobility comments: OOB in chair    Transfers Overall transfer level: Needs assistance Equipment used: Rolling walker (2 wheels) Transfers: Sit to/from Stand Sit to Stand: Min guard            General transfer comment: cues for hand placement    Ambulation/Gait Ambulation/Gait assistance: Min guard, Supervision Gait Distance (Feet): 120 Feet Assistive device: Rolling walker (2 wheels) Gait Pattern/deviations: Step-to pattern       General Gait Details: cues for sequence, RW position and safety with turns.  no knee buckling, good stability, no LOB however pt expresses he is fearful of falling  Stairs            Wheelchair Mobility    Modified Rankin (Stroke Patients Only)       Balance                                             Pertinent Vitals/Pain Pain Assessment Pain Assessment: Faces Faces Pain Scale: Hurts little more Pain Location: right knee Pain Descriptors / Indicators: Aching, Grimacing, Sore Pain Intervention(s): Limited activity within patient's tolerance, Monitored during session    Home Living Family/patient expects to be discharged to:: Private residence Living Arrangements: Spouse/significant other Available Help at Discharge: Family;Available 24 hours/day Type of Home: House Home Access: Level entry       Home Layout: Able to live on main level with bedroom/bathroom;One level (one level with basement, pt plans to stay on lower level) Home Equipment: Rolling Walker (2 wheels);Grab bars - tub/shower;Cane - single point      Prior Function Prior Level of Function : Independent/Modified Independent  Hand Dominance        Extremity/Trunk Assessment   Upper Extremity Assessment Upper Extremity Assessment: Overall WFL for tasks assessed    Lower Extremity Assessment Lower Extremity Assessment: RLE deficits/detail RLE Deficits / Details: ankle WFL, knee extension and hip flexion 3/5       Communication   Communication: No difficulties  Cognition Arousal/Alertness: Awake/alert Behavior During Therapy: WFL for tasks assessed/performed Overall Cognitive Status: Within Functional  Limits for tasks assessed                                          General Comments      Exercises Total Joint Exercises Ankle Circles/Pumps: AROM, Both, 10 reps Quad Sets: 10 reps, Both, AROM Heel Slides: AAROM, Right, 10 reps Straight Leg Raises: AROM, AAROM, Right, 10 reps   Assessment/Plan    PT Assessment All further PT needs can be met in the next venue of care  PT Problem List         PT Treatment Interventions      PT Goals (Current goals can be found in the Care Plan section)  Acute Rehab PT Goals PT Goal Formulation: All assessment and education complete, DC therapy (to d/c home today with OPPT f/u)    Frequency       Co-evaluation               AM-PAC PT "6 Clicks" Mobility  Outcome Measure Help needed turning from your back to your side while in a flat bed without using bedrails?: None Help needed moving from lying on your back to sitting on the side of a flat bed without using bedrails?: None Help needed moving to and from a bed to a chair (including a wheelchair)?: A Little Help needed standing up from a chair using your arms (e.g., wheelchair or bedside chair)?: A Little Help needed to walk in hospital room?: A Little Help needed climbing 3-5 steps with a railing? : A Little 6 Click Score: 20    End of Session Equipment Utilized During Treatment: Gait belt Activity Tolerance: Patient tolerated treatment well Patient left: in chair;with call bell/phone within reach;with family/visitor present   PT Visit Diagnosis: Other abnormalities of gait and mobility (R26.89);Difficulty in walking, not elsewhere classified (R26.2)    Time:  -      Charges:              Baxter Flattery, PT  Acute Rehab Dept [facility]) [phone]  [facility] Weekend Pager [facility] only)  [phone]  07/24/2022   [facility] 07/24/2022, 10:59 AM

## 2022-07-24 NOTE — Progress Notes (Signed)
   Subjective: 1 Day Post-Op Procedure(s) (LRB): TOTAL KNEE ARTHROPLASTY (Right) Patient reports pain as mild.   Patient seen in rounds by Dr. Alvan Dame. Patient is well, and has had no acute complaints or problems. No acute events overnight. Foley catheter removed. Patient has not been up with PT yet.  We will start therapy today.   Objective: Vital signs in last 24 hours: Temp:  [97.4 F (36.3 C)-98.2 F (36.8 C)] 98.2 F (36.8 C) (11/01 5625) Pulse Rate:  [50-73] 60 (11/01 0638) Resp:  [11-29] 17 (11/01 0638) BP: (137-176)/(64-85) 147/69 (11/01 0638) SpO2:  [94 %-100 %] 98 % (11/01 6389) Weight:  [115.2 kg] 115.2 kg (10/31 1326)  Intake/Output from previous day:  Intake/Output Summary (Last 24 hours) at 07/24/2022 0746 Last data filed at 07/24/2022 3734 Gross per 24 hour  Intake 2480 ml  Output 1375 ml  Net 1105 ml     Intake/Output this shift: No intake/output data recorded.  Labs: Recent Labs    07/24/22 0348  HGB 12.2*   Recent Labs    07/24/22 0348  WBC 9.7  RBC 4.33  HCT 39.8  PLT 163   Recent Labs    07/24/22 0348  NA 140  K 4.1  CL 107  CO2 26  BUN 25*  CREATININE 1.08  GLUCOSE 220*  CALCIUM 8.9   No results for input(s): "LABPT", "INR" in the last 72 hours.  Exam: General - Patient is Alert and Oriented Extremity - Neurologically intact Sensation intact distally Intact pulses distally Dorsiflexion/Plantar flexion intact Dressing - dressing C/D/I Motor Function - intact, moving foot and toes well on exam.   Past Medical History:  Diagnosis Date   Anxiety    Arthritis    Depression    Diabetes mellitus without complication (HCC)    Dyslipidemia    GERD (gastroesophageal reflux disease)    Gout    history of after surgery 02/2022   History of kidney stones    Hypertension    Obesity    BMI 41   Pneumonia    Sleep apnea    on C-pap    Assessment/Plan: 1 Day Post-Op Procedure(s) (LRB): TOTAL KNEE ARTHROPLASTY (Right) Principal  Problem:   S/P total knee arthroplasty, right  Estimated body mass index is 38.62 kg/m as calculated from the following:   Height as of this encounter: 5\' 8"  (1.727 m).   Weight as of this encounter: 115.2 kg. Advance diet Up with therapy D/C IV fluids   Patient's anticipated LOS is less than 2 midnights, meeting these requirements: - Younger than 43 - Lives within 1 hour of care - Has a competent adult at home to recover with post-op recover - NO history of  - Chronic pain requiring opiods  - Diabetes  - Coronary Artery Disease  - Heart failure  - Heart attack  - Stroke  - DVT/VTE  - Cardiac arrhythmia  - Respiratory Failure/COPD  - Renal failure  - Anemia  - Advanced Liver disease     DVT Prophylaxis - Xarelto Weight bearing as tolerated.  Hgb stable at 12.2 this AM. Will fax order to [facility] for therapy.   Plan is to go Home after hospital stay. Plan for discharge today following 1-2 sessions of PT as long as they are meeting their goals. Patient is scheduled for OPPT. Follow up in the office in 2 weeks.   Griffith Citron, PA-C Orthopedic Surgery [phone] 07/24/2022, 7:46 AM

## 2022-07-24 NOTE — Plan of Care (Signed)
Plan of care reviewed and discussed with the patient. 

## 2022-07-24 NOTE — TOC Transition Note (Signed)
Transition of Care [facility]) - CM/SW Discharge Note   Patient Details  Name: Noah Hampton MRN: [id number] Date of Birth: March 31, 1951  Transition of Care [facility]) CM/SW Contact:  Lennart Pall, LCSW Phone Number: 07/24/2022, 9:44 AM   Clinical Narrative:    Met with pt and confirming he has needed DME at home.  OPPT already arranged with [facility] in [place]. No further TOC needs.   Final next level of care: OP Rehab Barriers to Discharge: No Barriers Identified   Patient Goals and CMS Choice Patient states their goals for this hospitalization and ongoing recovery are:: return home      Discharge Placement                       Discharge Plan and Services                DME Arranged: N/A DME Agency: NA                  Social Determinants of Health (SDOH) Interventions     Readmission Risk Interventions     No data to display

## 2022-07-24 NOTE — Plan of Care (Signed)
  Problem: Activity: Goal: Risk for activity intolerance will decrease Outcome: Progressing   Problem: Pain Managment: Goal: General experience of comfort will improve Outcome: Progressing   Problem: Safety: Goal: Ability to remain free from injury will improve Outcome: Progressing   

## 2022-07-30 NOTE — Discharge Summary (Signed)
Patient ID: Noah Hampton MRN: [id number] DOB/AGE: 12/18/1950 71 y.o.  Admit date: 07/23/2022 Discharge date: 07/24/2022  Admission Diagnoses:  Right knee osteoarthritis  Discharge Diagnoses:  Principal Problem:   S/P total knee arthroplasty, right   Past Medical History:  Diagnosis Date   Anxiety    Arthritis    Depression    Diabetes mellitus without complication ([place])    Dyslipidemia    GERD (gastroesophageal reflux disease)    Gout    history of after surgery 02/2022   History of kidney stones    Hypertension    Obesity    BMI 41   Pneumonia    Sleep apnea    on C-pap    Surgeries: Procedure(s): TOTAL KNEE ARTHROPLASTY on 07/23/2022   Consultants:   Discharged Condition: Improved  Hospital Course: Noah Hampton is an 71 y.o. male who was admitted 07/23/2022 for operative treatment ofS/P total knee arthroplasty, right. Patient has severe unremitting pain that affects sleep, daily activities, and work/hobbies. After pre-op clearance the patient was taken to the operating room on 07/23/2022 and underwent  Procedure(s): TOTAL KNEE ARTHROPLASTY.    Patient was given perioperative antibiotics:  Anti-infectives (From admission, onward)    Start     Dose/Rate Route Frequency Ordered Stop   07/23/22 2200  ceFAZolin (ANCEF) IVPB 2g/100 mL premix        2 g 200 mL/hr over 30 Minutes Intravenous Every 6 hours 07/23/22 1830 07/24/22 0409   07/23/22 1315  ceFAZolin (ANCEF) IVPB 2g/100 mL premix        2 g 200 mL/hr over 30 Minutes Intravenous On call to O.R. 07/23/22 1309 07/23/22 1536        Patient was given sequential compression devices, early ambulation, and chemoprophylaxis to prevent DVT. Patient worked with PT and was meeting their goals regarding safe ambulation and transfers.  Patient benefited maximally from hospital stay and there were no complications.    Recent vital signs: No data found.   Recent laboratory studies: No results for input(s): "WBC", "HGB",  "HCT", "PLT", "NA", "K", "CL", "CO2", "BUN", "CREATININE", "GLUCOSE", "INR", "CALCIUM" in the last 72 hours.  Invalid input(s): "PT", "2"   Discharge Medications:   Allergies as of 07/24/2022       Reactions   Statins Other (See Comments)    Myalgias (Muscle Pain)   Alpha-gal Other (See Comments)   unknown   Other Other (See Comments)   Beef/ Unknown        Medication List     STOP taking these medications    aspirin EC 81 MG tablet   aspirin-acetaminophen-caffeine 250-250-65 MG tablet Commonly known as: EXCEDRIN MIGRAINE   celecoxib 200 MG capsule Commonly known as: CELEBREX   docusate sodium 100 MG capsule Commonly known as: COLACE       TAKE these medications    acetaminophen 500 MG tablet Commonly known as: TYLENOL Take 2 tablets (1,000 mg total) by mouth every 6 (six) hours.   albuterol 108 (90 Base) MCG/ACT inhaler Commonly known as: VENTOLIN HFA Inhale 2 puffs into the lungs every 6 (six) hours as needed for wheezing.   amLODipine 5 MG tablet Commonly known as: NORVASC Take 5 mg by mouth daily.   carvedilol 6.25 MG tablet Commonly known as: COREG Take 6.25 mg by mouth 2 (two) times daily.   Centrum Adults Tabs Take 1 tablet by mouth daily.   chlorthalidone 25 MG tablet Commonly known as: HYGROTON Take 25 mg by mouth every morning.  colchicine 0.6 MG tablet Take 0.6 mg by mouth daily.   escitalopram 10 MG tablet Commonly known as: LEXAPRO Take 10 mg by mouth daily.   Farxiga 10 MG Tabs tablet Generic drug: dapagliflozin propanediol Take 10 mg by mouth daily.   glimepiride 2 MG tablet Commonly known as: AMARYL Take 2 mg by mouth every morning.   lisinopril 40 MG tablet Commonly known as: ZESTRIL Take 40 mg by mouth daily.   methocarbamol 500 MG tablet Commonly known as: ROBAXIN Take 1 tablet (500 mg total) by mouth every 6 (six) hours as needed for muscle spasms.   NON FORMULARY Pt uses a cpap nightly   oxyCODONE 5 MG  immediate release tablet Commonly known as: Oxy IR/ROXICODONE Take 1 tablet (5 mg total) by mouth every 4 (four) hours as needed for severe pain (pain score 4-6). What changed: reasons to take this   pioglitazone 30 MG tablet Commonly known as: ACTOS Take 30 mg by mouth daily.   polyethylene glycol 17 g packet Commonly known as: MIRALAX / GLYCOLAX Take 17 g by mouth daily. What changed:  when to take this reasons to take this   Rexulti 1 MG Tabs tablet Generic drug: brexpiprazole Take 1 mg by mouth daily.   rivaroxaban 10 MG Tabs tablet Commonly known as: XARELTO Take 1 tablet (10 mg total) by mouth daily for 14 days. Then take aspirin 81 mg twice a day for another 4 weeks What changed: additional instructions   senna 8.6 MG Tabs tablet Commonly known as: SENOKOT Take 2 tablets (17.2 mg total) by mouth at bedtime.               Discharge Care Instructions  (From admission, onward)           Start     Ordered   07/24/22 0000  Change dressing       Comments: Maintain surgical dressing until follow up in the clinic. If the edges start to pull up, may reinforce with tape. If the dressing is no longer working, may remove and cover with gauze and tape, but must keep the area dry and clean.  Call with any questions or concerns.   07/24/22 0750            Diagnostic Studies: No results found.  Disposition: Discharge disposition: 01-Home or Self Care       Discharge Instructions     Call MD / Call 911   Complete by: As directed    If you experience chest pain or shortness of breath, CALL 911 and be transported to the hospital emergency room.  If you develope a fever above 101 F, pus (white drainage) or increased drainage or redness at the wound, or calf pain, call your surgeon's office.   Change dressing   Complete by: As directed    Maintain surgical dressing until follow up in the clinic. If the edges start to pull up, may reinforce with tape. If the  dressing is no longer working, may remove and cover with gauze and tape, but must keep the area dry and clean.  Call with any questions or concerns.   Constipation Prevention   Complete by: As directed    Drink plenty of fluids.  Prune juice may be helpful.  You may use a stool softener, such as Colace (over the counter) 100 mg twice a day.  Use MiraLax (over the counter) for constipation as needed.   Diet - low sodium heart healthy  Complete by: As directed    Increase activity slowly as tolerated   Complete by: As directed    Weight bearing as tolerated with assist device (walker, cane, etc) as directed, use it as long as suggested by your surgeon or therapist, typically at least 4-6 weeks.   Post-operative opioid taper instructions:   Complete by: As directed    POST-OPERATIVE OPIOID TAPER INSTRUCTIONS: It is important to wean off of your opioid medication as soon as possible. If you do not need pain medication after your surgery it is ok to stop day one. Opioids include: Codeine, Hydrocodone(Norco, Vicodin), Oxycodone(Percocet, oxycontin) and hydromorphone amongst others.  Long term and even short term use of opiods can cause: Increased pain response Dependence Constipation Depression Respiratory depression And more.  Withdrawal symptoms can include Flu like symptoms Nausea, vomiting And more Techniques to manage these symptoms Hydrate well Eat regular healthy meals Stay active Use relaxation techniques(deep breathing, meditating, yoga) Do Not substitute Alcohol to help with tapering If you have been on opioids for less than two weeks and do not have pain than it is ok to stop all together.  Plan to wean off of opioids This plan should start within one week post op of your joint replacement. Maintain the same interval or time between taking each dose and first decrease the dose.  Cut the total daily intake of opioids by one tablet each day Next start to increase the time  between doses. The last dose that should be eliminated is the evening dose.      TED hose   Complete by: As directed    Use stockings (TED hose) for 2 weeks on both leg(s).  You may remove them at night for sleeping.        Follow-up Information     Durene Romans, MD. Schedule an appointment as soon as possible for a visit in 2 week(s).   Specialty: Orthopedic Surgery Contact information: [address] [place] [address] [phone]                  Signed: Cassandria Anger 07/30/2022, 3:53 PM

## 2022-09-29 DIAGNOSIS — R32 Unspecified urinary incontinence: Secondary | ICD-10-CM | POA: Insufficient documentation

## 2022-09-29 DIAGNOSIS — R0981 Nasal congestion: Secondary | ICD-10-CM | POA: Insufficient documentation

## 2022-11-09 ENCOUNTER — Emergency Department (HOSPITAL_COMMUNITY): Payer: BC Managed Care – PPO

## 2022-11-09 ENCOUNTER — Other Ambulatory Visit: Payer: Self-pay

## 2022-11-09 ENCOUNTER — Inpatient Hospital Stay (HOSPITAL_COMMUNITY): Payer: BC Managed Care – PPO

## 2022-11-09 ENCOUNTER — Inpatient Hospital Stay (HOSPITAL_COMMUNITY)
Admission: EM | Admit: 2022-11-09 | Discharge: 2022-11-15 | DRG: 291 | Disposition: A | Discharge status: Home / Self Care | Payer: BC Managed Care – PPO | Attending: Internal Medicine | Admitting: Internal Medicine

## 2022-11-09 DIAGNOSIS — R001 Bradycardia, unspecified: Secondary | ICD-10-CM | POA: Diagnosis not present

## 2022-11-09 DIAGNOSIS — Z888 Allergy status to other drugs, medicaments and biological substances status: Secondary | ICD-10-CM | POA: Diagnosis not present

## 2022-11-09 DIAGNOSIS — I5023 Acute on chronic systolic (congestive) heart failure: Secondary | ICD-10-CM | POA: Diagnosis not present

## 2022-11-09 DIAGNOSIS — I509 Heart failure, unspecified: Secondary | ICD-10-CM

## 2022-11-09 DIAGNOSIS — J189 Pneumonia, unspecified organism: Secondary | ICD-10-CM | POA: Diagnosis present

## 2022-11-09 DIAGNOSIS — J9601 Acute respiratory failure with hypoxia: Secondary | ICD-10-CM | POA: Diagnosis present

## 2022-11-09 DIAGNOSIS — I429 Cardiomyopathy, unspecified: Secondary | ICD-10-CM | POA: Diagnosis present

## 2022-11-09 DIAGNOSIS — Z6841 Body Mass Index (BMI) 40.0 and over, adult: Secondary | ICD-10-CM | POA: Diagnosis not present

## 2022-11-09 DIAGNOSIS — I5A Non-ischemic myocardial injury (non-traumatic): Secondary | ICD-10-CM | POA: Diagnosis present

## 2022-11-09 DIAGNOSIS — F32A Depression, unspecified: Secondary | ICD-10-CM | POA: Diagnosis present

## 2022-11-09 DIAGNOSIS — I10 Essential (primary) hypertension: Secondary | ICD-10-CM | POA: Diagnosis present

## 2022-11-09 DIAGNOSIS — R0609 Other forms of dyspnea: Secondary | ICD-10-CM | POA: Diagnosis not present

## 2022-11-09 DIAGNOSIS — J984 Other disorders of lung: Secondary | ICD-10-CM

## 2022-11-09 DIAGNOSIS — M10071 Idiopathic gout, right ankle and foot: Secondary | ICD-10-CM | POA: Diagnosis present

## 2022-11-09 DIAGNOSIS — Z79899 Other long term (current) drug therapy: Secondary | ICD-10-CM

## 2022-11-09 DIAGNOSIS — E669 Obesity, unspecified: Secondary | ICD-10-CM | POA: Diagnosis present

## 2022-11-09 DIAGNOSIS — Z91014 Allergy to mammalian meats: Secondary | ICD-10-CM | POA: Diagnosis not present

## 2022-11-09 DIAGNOSIS — E7849 Other hyperlipidemia: Secondary | ICD-10-CM | POA: Diagnosis present

## 2022-11-09 DIAGNOSIS — F419 Anxiety disorder, unspecified: Secondary | ICD-10-CM | POA: Diagnosis present

## 2022-11-09 DIAGNOSIS — I110 Hypertensive heart disease with heart failure: Secondary | ICD-10-CM | POA: Diagnosis present

## 2022-11-09 DIAGNOSIS — Z96653 Presence of artificial knee joint, bilateral: Secondary | ICD-10-CM | POA: Diagnosis present

## 2022-11-09 DIAGNOSIS — G4733 Obstructive sleep apnea (adult) (pediatric): Secondary | ICD-10-CM | POA: Diagnosis present

## 2022-11-09 DIAGNOSIS — G4730 Sleep apnea, unspecified: Secondary | ICD-10-CM | POA: Diagnosis not present

## 2022-11-09 DIAGNOSIS — Z833 Family history of diabetes mellitus: Secondary | ICD-10-CM

## 2022-11-09 DIAGNOSIS — Z87442 Personal history of urinary calculi: Secondary | ICD-10-CM

## 2022-11-09 DIAGNOSIS — Z1152 Encounter for screening for COVID-19: Secondary | ICD-10-CM | POA: Diagnosis not present

## 2022-11-09 DIAGNOSIS — K219 Gastro-esophageal reflux disease without esophagitis: Secondary | ICD-10-CM | POA: Diagnosis present

## 2022-11-09 DIAGNOSIS — I5021 Acute systolic (congestive) heart failure: Secondary | ICD-10-CM | POA: Diagnosis present

## 2022-11-09 DIAGNOSIS — I824Y3 Acute embolism and thrombosis of unspecified deep veins of proximal lower extremity, bilateral: Secondary | ICD-10-CM | POA: Diagnosis present

## 2022-11-09 DIAGNOSIS — E1165 Type 2 diabetes mellitus with hyperglycemia: Secondary | ICD-10-CM | POA: Diagnosis present

## 2022-11-09 LAB — CBC WITH DIFFERENTIAL/PLATELET
Abs Immature Granulocytes: 0.01 10*3/uL (ref 0.00–0.07)
Basophils Absolute: 0.1 10*3/uL (ref 0.0–0.1)
Basophils Relative: 1 %
Eosinophils Absolute: 0.2 10*3/uL (ref 0.0–0.5)
Eosinophils Relative: 3 %
HCT: 42 % (ref 39.0–52.0)
Hemoglobin: 12.7 g/dL — ABNORMAL LOW (ref 13.0–17.0)
Immature Granulocytes: 0 %
Lymphocytes Relative: 16 %
Lymphs Abs: 1.1 10*3/uL (ref 0.7–4.0)
MCH: 29.3 pg (ref 26.0–34.0)
MCHC: 30.2 g/dL (ref 30.0–36.0)
MCV: 97 fL (ref 80.0–100.0)
Monocytes Absolute: 0.6 10*3/uL (ref 0.1–1.0)
Monocytes Relative: 8 %
Neutro Abs: 4.8 10*3/uL (ref 1.7–7.7)
Neutrophils Relative %: 72 %
Platelets: 122 10*3/uL — ABNORMAL LOW (ref 150–400)
RBC: 4.33 MIL/uL (ref 4.22–5.81)
RDW: 13.5 % (ref 11.5–15.5)
WBC: 6.6 10*3/uL (ref 4.0–10.5)
nRBC: 0 % (ref 0.0–0.2)

## 2022-11-09 LAB — RESP PANEL BY RT-PCR (RSV, FLU A&B, COVID)  RVPGX2
Influenza A by PCR: NEGATIVE
Influenza B by PCR: NEGATIVE
Resp Syncytial Virus by PCR: NEGATIVE
SARS Coronavirus 2 by RT PCR: NEGATIVE

## 2022-11-09 LAB — COMPREHENSIVE METABOLIC PANEL
ALT: 55 U/L — ABNORMAL HIGH (ref 0–44)
AST: 34 U/L (ref 15–41)
Albumin: 3.1 g/dL — ABNORMAL LOW (ref 3.5–5.0)
Alkaline Phosphatase: 83 U/L (ref 38–126)
Anion gap: 8 (ref 5–15)
BUN: 17 mg/dL (ref 8–23)
CO2: 29 mmol/L (ref 22–32)
Calcium: 8.6 mg/dL — ABNORMAL LOW (ref 8.9–10.3)
Chloride: 103 mmol/L (ref 98–111)
Creatinine, Ser: 0.93 mg/dL (ref 0.61–1.24)
GFR, Estimated: >60 mL/min (ref >60)
Glucose, Bld: 230 mg/dL — ABNORMAL HIGH (ref 70–99)
Potassium: 4.6 mmol/L (ref 3.5–5.1)
Sodium: 140 mmol/L (ref 135–145)
Total Bilirubin: 0.6 mg/dL (ref 0.3–1.2)
Total Protein: 6.2 g/dL — ABNORMAL LOW (ref 6.5–8.1)

## 2022-11-09 LAB — GLUCOSE, CAPILLARY
Glucose-Capillary: 136 mg/dL — ABNORMAL HIGH (ref 70–99)
Glucose-Capillary: 279 mg/dL — ABNORMAL HIGH (ref 70–99)

## 2022-11-09 LAB — BRAIN NATRIURETIC PEPTIDE: B Natriuretic Peptide: 436 pg/mL — ABNORMAL HIGH (ref 0.0–100.0)

## 2022-11-09 LAB — TROPONIN I (HIGH SENSITIVITY)
Troponin I (High Sensitivity): 24 ng/L — ABNORMAL HIGH (ref <18)
Troponin I (High Sensitivity): 26 ng/L — ABNORMAL HIGH (ref <18)

## 2022-11-09 MED ORDER — FUROSEMIDE 10 MG/ML IJ SOLN
40.0000 mg | Freq: Two times a day (BID) | INTRAMUSCULAR | Status: DC
  Administered 2022-11-10 – 2022-11-12 (×5): 40 mg via INTRAVENOUS
  Filled 2022-11-09 (×5): qty 4

## 2022-11-09 MED ORDER — HYDRALAZINE HCL 20 MG/ML IJ SOLN: 10.0000 mg | Freq: Four times a day (QID) | INTRAMUSCULAR | Status: DC | PRN

## 2022-11-09 MED ORDER — DM-GUAIFENESIN ER 30-600 MG PO TB12
1.0000 | ORAL_TABLET | Freq: Two times a day (BID) | ORAL | Status: DC
  Administered 2022-11-09 – 2022-11-15 (×12): 1 via ORAL
  Filled 2022-11-09 (×12): qty 1

## 2022-11-09 MED ORDER — CITALOPRAM HYDROBROMIDE 20 MG PO TABS
20.0000 mg | ORAL_TABLET | Freq: Every day | ORAL | Status: DC
  Administered 2022-11-09 – 2022-11-15 (×7): 20 mg via ORAL
  Filled 2022-11-09 (×7): qty 1

## 2022-11-09 MED ORDER — SODIUM CHLORIDE 0.9% FLUSH
3.0000 mL | Freq: Two times a day (BID) | INTRAVENOUS | Status: DC
  Administered 2022-11-10 – 2022-11-14 (×3): 3 mL via INTRAVENOUS

## 2022-11-09 MED ORDER — POLYETHYLENE GLYCOL 3350 17 G PO PACK: 17.0000 g | PACK | Freq: Every day | ORAL | Status: DC | PRN

## 2022-11-09 MED ORDER — IOHEXOL 350 MG/ML SOLN
100.0000 mL | Freq: Once | INTRAVENOUS | Status: AC | PRN
  Administered 2022-11-09: 100 mL via INTRAVENOUS

## 2022-11-09 MED ORDER — SODIUM CHLORIDE 0.9 % IV SOLN
500.0000 mg | INTRAVENOUS | Status: DC
  Administered 2022-11-09 – 2022-11-13 (×5): 500 mg via INTRAVENOUS
  Filled 2022-11-09 (×5): qty 5

## 2022-11-09 MED ORDER — TRAZODONE HCL 50 MG PO TABS: 50.0000 mg | ORAL_TABLET | Freq: Every evening | ORAL | Status: DC | PRN

## 2022-11-09 MED ORDER — ACETAMINOPHEN 650 MG RE SUPP: 650.0000 mg | Freq: Four times a day (QID) | RECTAL | Status: DC | PRN

## 2022-11-09 MED ORDER — SODIUM CHLORIDE 0.9 % IV SOLN
2.0000 g | INTRAVENOUS | Status: AC
  Administered 2022-11-09 – 2022-11-13 (×5): 2 g via INTRAVENOUS
  Filled 2022-11-09 (×5): qty 20

## 2022-11-09 MED ORDER — LABETALOL HCL 5 MG/ML IV SOLN: 10.0000 mg | INTRAVENOUS | Status: DC | PRN

## 2022-11-09 MED ORDER — SODIUM CHLORIDE 0.9 % IV SOLN: INTRAVENOUS | Status: DC | PRN

## 2022-11-09 MED ORDER — SODIUM CHLORIDE 0.9% FLUSH: 3.0000 mL | INTRAVENOUS | Status: DC | PRN

## 2022-11-09 MED ORDER — ONDANSETRON HCL 4 MG PO TABS: 4.0000 mg | ORAL_TABLET | Freq: Four times a day (QID) | ORAL | Status: DC | PRN

## 2022-11-09 MED ORDER — BISACODYL 10 MG RE SUPP: 10.0000 mg | Freq: Every day | RECTAL | Status: DC | PRN

## 2022-11-09 MED ORDER — FUROSEMIDE 10 MG/ML IJ SOLN: 40.0000 mg | Freq: Two times a day (BID) | INTRAMUSCULAR | Status: DC

## 2022-11-09 MED ORDER — CARVEDILOL 3.125 MG PO TABS
6.2500 mg | ORAL_TABLET | Freq: Two times a day (BID) | ORAL | Status: DC
  Administered 2022-11-09 – 2022-11-15 (×12): 6.25 mg via ORAL
  Filled 2022-11-09 (×12): qty 2

## 2022-11-09 MED ORDER — TRAZODONE HCL 50 MG PO TABS
100.0000 mg | ORAL_TABLET | Freq: Every day | ORAL | Status: DC
  Administered 2022-11-09 – 2022-11-14 (×6): 100 mg via ORAL
  Filled 2022-11-09 (×6): qty 2

## 2022-11-09 MED ORDER — AMLODIPINE BESYLATE 5 MG PO TABS
5.0000 mg | ORAL_TABLET | Freq: Every day | ORAL | Status: DC
  Administered 2022-11-09 – 2022-11-11 (×3): 5 mg via ORAL
  Filled 2022-11-09 (×3): qty 1

## 2022-11-09 MED ORDER — ALBUTEROL SULFATE (2.5 MG/3ML) 0.083% IN NEBU
2.5000 mg | INHALATION_SOLUTION | RESPIRATORY_TRACT | Status: DC | PRN
  Administered 2022-11-12: 2.5 mg via RESPIRATORY_TRACT
  Filled 2022-11-09: qty 3

## 2022-11-09 MED ORDER — ACETAMINOPHEN 325 MG PO TABS
650.0000 mg | ORAL_TABLET | Freq: Four times a day (QID) | ORAL | Status: DC | PRN
  Administered 2022-11-11 (×2): 650 mg via ORAL
  Filled 2022-11-09 (×2): qty 2

## 2022-11-09 MED ORDER — FUROSEMIDE 10 MG/ML IJ SOLN
60.0000 mg | Freq: Once | INTRAMUSCULAR | Status: AC
  Administered 2022-11-09: 60 mg via INTRAVENOUS
  Filled 2022-11-09: qty 6

## 2022-11-09 MED ORDER — ONDANSETRON HCL 4 MG/2ML IJ SOLN: 4.0000 mg | Freq: Four times a day (QID) | INTRAMUSCULAR | Status: DC | PRN

## 2022-11-09 MED ORDER — INSULIN ASPART 100 UNIT/ML IJ SOLN
0.0000 [IU] | Freq: Every day | INTRAMUSCULAR | Status: DC
  Administered 2022-11-09: 3 [IU] via SUBCUTANEOUS
  Administered 2022-11-12 – 2022-11-14 (×2): 2 [IU] via SUBCUTANEOUS

## 2022-11-09 MED ORDER — INSULIN ASPART 100 UNIT/ML IJ SOLN
0.0000 [IU] | Freq: Three times a day (TID) | INTRAMUSCULAR | Status: DC
  Administered 2022-11-09: 1 [IU] via SUBCUTANEOUS
  Administered 2022-11-10: 2 [IU] via SUBCUTANEOUS
  Administered 2022-11-10: 1 [IU] via SUBCUTANEOUS
  Administered 2022-11-10: 2 [IU] via SUBCUTANEOUS
  Administered 2022-11-11 (×2): 3 [IU] via SUBCUTANEOUS
  Administered 2022-11-11 – 2022-11-12 (×2): 2 [IU] via SUBCUTANEOUS
  Administered 2022-11-12: 3 [IU] via SUBCUTANEOUS
  Administered 2022-11-12 – 2022-11-13 (×2): 5 [IU] via SUBCUTANEOUS
  Administered 2022-11-13: 7 [IU] via SUBCUTANEOUS
  Administered 2022-11-13: 1 [IU] via SUBCUTANEOUS
  Administered 2022-11-14: 2 [IU] via SUBCUTANEOUS
  Administered 2022-11-14: 1 [IU] via SUBCUTANEOUS
  Administered 2022-11-14: 3 [IU] via SUBCUTANEOUS
  Administered 2022-11-15: 2 [IU] via SUBCUTANEOUS
  Administered 2022-11-15: 3 [IU] via SUBCUTANEOUS

## 2022-11-09 MED ORDER — IPRATROPIUM-ALBUTEROL 0.5-2.5 (3) MG/3ML IN SOLN
3.0000 mL | Freq: Three times a day (TID) | RESPIRATORY_TRACT | Status: DC
  Administered 2022-11-09 – 2022-11-12 (×8): 3 mL via RESPIRATORY_TRACT
  Filled 2022-11-09 (×8): qty 3

## 2022-11-09 MED ORDER — SODIUM CHLORIDE 0.9% FLUSH
3.0000 mL | Freq: Two times a day (BID) | INTRAVENOUS | Status: DC
  Administered 2022-11-10 – 2022-11-14 (×9): 3 mL via INTRAVENOUS

## 2022-11-09 NOTE — ED Triage Notes (Signed)
Pt here via POV with worsening shob over the last few weeks. Pt wears 2L [place] at baseline. Appears shob during triage.

## 2022-11-09 NOTE — Progress Notes (Signed)
Patient refused CPAP for tonight since he was wearing oxygen. CPAP is available if he needs.
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
## 2022-11-09 NOTE — Progress Notes (Signed)
   11/09/22 1600  ReDS Vest / Clip  Station Marker D  Ruler Value 35  ReDS Value Range < 36  ReDS Actual Value 44

## 2022-11-09 NOTE — ED Provider Notes (Signed)
[facility] Provider Note   CSN: [id number] Arrival date & time: 11/09/22  1025     History  Chief Complaint  Patient presents with   Shortness of Breath    Noah Hampton is a 72 y.o. male.  HPI Patient presents with his wife who assists with the history.  He notes that about 3 weeks ago he began having shortness of breath.  He has been seen, evaluated at different clinics in [place].  He has been started on oxygen, has been started on Lasix, but continues to get worse instead of better with abdominal distention, lower extremity edema, dyspnea and spite of taking all medication and using home oxygen as above. No actual pain, no fever.  Today he went to a clinic and was sent here for evaluation.    Home Medications Prior to Admission medications   Medication Sig Start Date End Date Taking? Authorizing Provider  amLODipine (NORVASC) 5 MG tablet Take 5 mg by mouth daily.   Yes [provider]  brexpiprazole (REXULTI) 1 MG TABS tablet Take 1 mg by mouth daily.   Yes [provider]  carvedilol (COREG) 12.5 MG tablet Take 1 tablet by mouth 2 (two) times daily.   Yes [provider]  colchicine 0.6 MG tablet Take 0.6 mg by mouth daily.   Yes [provider]  escitalopram (LEXAPRO) 10 MG tablet Take 10 mg by mouth daily. 02/12/22  Yes [provider]  FARXIGA 10 MG TABS tablet Take 10 mg by mouth daily. 02/12/22  Yes [provider]  furosemide (LASIX) 20 MG tablet Take 1 tablet by mouth daily.   Yes [provider]  glimepiride (AMARYL) 2 MG tablet Take 2 mg by mouth every morning. 02/12/22  Yes [provider]  hydrOXYzine (ATARAX) 25 MG tablet Take 1 tablet by mouth at bedtime.   Yes [provider]  Multiple Vitamins-Minerals (CENTRUM ADULTS) TABS Take 1 tablet by mouth daily.   Yes [provider]  polyethylene glycol (MIRALAX / GLYCOLAX) 17 g packet Take  17 g by mouth daily. 07/24/22  Yes Irving Copas, PA-C  potassium chloride (KLOR-CON) 20 MEQ packet Take 20 mEq by mouth daily.   Yes [provider]  NON FORMULARY Pt uses a cpap nightly    [provider]      Allergies    Statins, Alpha-gal, and Other    Review of Systems   Review of Systems  All other systems reviewed and are negative.   Physical Exam Updated Vital Signs BP (!) 177/85   Pulse 82   Temp 97.9 F (36.6 C) (Oral)   Resp (!) 30   SpO2 94%  Physical Exam Vitals and nursing note reviewed.  Constitutional:      General: He is not in acute distress.    Appearance: He is well-developed.  HENT:     Head: Normocephalic and atraumatic.  Eyes:     Conjunctiva/sclera: Conjunctivae normal.  Cardiovascular:     Rate and Rhythm: Normal rate and regular rhythm.  Pulmonary:     Effort: Tachypnea present.     Breath sounds: Decreased breath sounds present.  Abdominal:     General: There is no distension.  Musculoskeletal:     Right lower leg: Edema present.     Left lower leg: Edema present.  Skin:    General: Skin is warm and dry.  Neurological:     Mental Status: He is alert  and oriented to person, place, and time.     ED Results / Procedures / Treatments   Labs (all labs ordered are listed, but only abnormal results are displayed) Labs Reviewed  BRAIN NATRIURETIC PEPTIDE - Abnormal; Notable for the following components:      Result Value   B Natriuretic Peptide 436.0 (*)    All other components within normal limits  COMPREHENSIVE METABOLIC PANEL - Abnormal; Notable for the following components:   Glucose, Bld 230 (*)    Calcium 8.6 (*)    Total Protein 6.2 (*)    Albumin 3.1 (*)    ALT 55 (*)    All other components within normal limits  CBC WITH DIFFERENTIAL/PLATELET - Abnormal; Notable for the following components:   Hemoglobin 12.7 (*)    Platelets 122 (*)    All other components within normal limits  TROPONIN I (HIGH  SENSITIVITY) - Abnormal; Notable for the following components:   Troponin I (High Sensitivity) 24 (*)    All other components within normal limits  RESP PANEL BY RT-PCR (RSV, FLU A&B, COVID)  RVPGX2  TROPONIN I (HIGH SENSITIVITY)    EKG EKG Interpretation  Date/Time:  Saturday November 09 2022 10:46:51 EST Ventricular Rate:  85 PR Interval:  236 QRS Duration: 141 QT Interval:  417 QTC Calculation: 496 R Axis:   112 Text Interpretation: Sinus rhythm Prolonged PR interval Nonspecific intraventricular conduction delay Anterior injury pattern Abnormal ECG Confirmed by Carmin Muskrat [phone]) on 11/09/2022 10:57:17 AM  Radiology DG Chest 2 View  Result Date: 11/09/2022 CLINICAL DATA:  Shortness of breath. EXAM: CHEST - 2 VIEW COMPARISON:  02/02/2016 FINDINGS: The heart is enlarged. Prominent mediastinal and hilar contours. Central vascular congestion, interstitial pulmonary edema and small bilateral pleural effusions consistent with CHF. No pulmonary infiltrates. Remote healed rib fractures and left clavicle fracture. IMPRESSION: CHF with small bilateral pleural effusions. Electronically Signed   By: Marijo Sanes M.D.   On: 11/09/2022 12:11    Procedures Procedures    Medications Ordered in ED Medications  furosemide (LASIX) injection 60 mg (has no administration in time range)    ED Course/ Medical Decision Making/ A&P                             Medical Decision Making Patient with a history of hypertension, diabetes, obesity presents with worsening dyspnea and spite of supplemental oxygen and Lasix.  Differential including COPD, CHF, hepatic or renal dysfunction.  Less likely ACS given the denial of chest pain, this is a consideration. Less likely PE as he is no identifiable risk factors.  Patient started on monitors, supplemental oxygen, labs x-ray ordered.  Pulse ox 98% 2 L nasal cannula abnormal Cardiac 70 sinus normal   Amount and/or Complexity of Data Reviewed Labs:  ordered. Radiology: ordered.  Risk Prescription drug management.   1:25 PM Patient with persistent tachypnea, with difficulty expressing full senses secondary to this.  Patient, his wife and I discussed all findings, continue to require supplemental oxygen, has BNP greater than 400, no recent echocardiogram, and with concern for heart failure exacerbation given his abnormal x-ray, tachypnea, new oxygen requirement patient will be admitted for further monitoring, management, IV diuretics.        Final Clinical Impression(s) / ED Diagnoses Final diagnoses:  Acute on chronic congestive heart failure, unspecified heart failure type [facility])     Carmin Muskrat, MD 11/09/22 1326

## 2022-11-10 ENCOUNTER — Inpatient Hospital Stay (HOSPITAL_COMMUNITY): Payer: BC Managed Care – PPO

## 2022-11-10 ENCOUNTER — Other Ambulatory Visit (HOSPITAL_COMMUNITY): Payer: Self-pay | Admitting: *Deleted

## 2022-11-10 DIAGNOSIS — R0609 Other forms of dyspnea: Secondary | ICD-10-CM | POA: Diagnosis not present

## 2022-11-10 DIAGNOSIS — I10 Essential (primary) hypertension: Secondary | ICD-10-CM | POA: Diagnosis not present

## 2022-11-10 DIAGNOSIS — G4730 Sleep apnea, unspecified: Secondary | ICD-10-CM

## 2022-11-10 DIAGNOSIS — I509 Heart failure, unspecified: Secondary | ICD-10-CM | POA: Diagnosis not present

## 2022-11-10 DIAGNOSIS — J984 Other disorders of lung: Secondary | ICD-10-CM | POA: Diagnosis not present

## 2022-11-10 LAB — GLUCOSE, CAPILLARY
Glucose-Capillary: 182 mg/dL — ABNORMAL HIGH (ref 70–99)
Glucose-Capillary: 181 mg/dL — ABNORMAL HIGH (ref 70–99)
Glucose-Capillary: 133 mg/dL — ABNORMAL HIGH (ref 70–99)
Glucose-Capillary: 165 mg/dL — ABNORMAL HIGH (ref 70–99)

## 2022-11-10 LAB — COMPREHENSIVE METABOLIC PANEL
ALT: 50 U/L — ABNORMAL HIGH (ref 0–44)
AST: 22 U/L (ref 15–41)
Albumin: 3 g/dL — ABNORMAL LOW (ref 3.5–5.0)
Alkaline Phosphatase: 76 U/L (ref 38–126)
Anion gap: 7 (ref 5–15)
BUN: 16 mg/dL (ref 8–23)
CO2: 33 mmol/L — ABNORMAL HIGH (ref 22–32)
Calcium: 8.5 mg/dL — ABNORMAL LOW (ref 8.9–10.3)
Chloride: 103 mmol/L (ref 98–111)
Creatinine, Ser: 0.9 mg/dL (ref 0.61–1.24)
GFR, Estimated: >60 mL/min (ref >60)
Glucose, Bld: 163 mg/dL — ABNORMAL HIGH (ref 70–99)
Potassium: 3.8 mmol/L (ref 3.5–5.1)
Sodium: 143 mmol/L (ref 135–145)
Total Bilirubin: 0.5 mg/dL (ref 0.3–1.2)
Total Protein: 6.2 g/dL — ABNORMAL LOW (ref 6.5–8.1)

## 2022-11-10 MED ORDER — PERFLUTREN LIPID MICROSPHERE
1.0000 mL | INTRAVENOUS | Status: AC | PRN
  Administered 2022-11-10: 3 mL via INTRAVENOUS

## 2022-11-10 NOTE — Progress Notes (Signed)
Pt has had fair day today. Did have shower this am, dyspneic with exertion. VSS. Remains on 2 lpm [place]. No cough noted. IV site restarted this shift due to pt accidentally pulled IV out moving in bed. Wife states concerns that pt becomes very confused at night, states will be staying with him due to confusion.

## 2022-11-10 NOTE — Progress Notes (Addendum)
PROGRESS NOTE     Noah Hampton, is a 72 y.o. male, DOB - 02-Feb-1951, DO:[id number]  Admit date - 11/09/2022   Admitting Physician Elvin Mccartin Denton Brick, MD  Outpatient Primary MD for the patient is Noah Code, DO  LOS - 1  Chief Complaint  Patient presents with   Shortness of Breath      Brief Narrative:  72 y.o. male with past medical history relevant for morbid obesity/OSA, HTN, DM2, HLD, depression GERD and gout admitted on 11/09/2022 with acute hypoxic respiratory failure in the setting of acute CHF exacerbation and possible pneumonia   -Assessment and Plan: 1)Acute Congestive Heart Failure--- ??  Diastolic Vs systolic--- await updated echocardiogram -Prior echo from 2017 with preserved EF of 60 to 65% at that time -Patient was admitted with with weight gain, leg swelling, orthopnea, dyspnea, -Chest x-ray was consistent with CHF and small bilateral pleural effusions  -BNP elevated at 436, -Troponin 24, repeat 26, EKG sinus rhythm with prolonged PR and nonspecific intraventricular conduction delay 11/10/22 -Appears to be diuresing well with Lasix, dyspnea and hypoxia improving -Patient apparently weighed 258 pounds in the ED on 11/09/2022.. --Weight is currently down to 255.3 pounds on 11/10/2022 -Diuresing well, about 3 L of urine output over the last 16 hours -Continue IV Lasix -Daily weight and fluid input and output monitoring -Echo and venous Dopplers pending -REDs Clip was 44 on 11/09/22   2)Possible Community-Acquired Pneumonia--- -CT chest without acute PE but does show pneumonitis --Flu, COVID and RSV negative -No leukocytosis or fevers -Cough has been persistent C/n Rocephin/Azithromycin, mucolytics and bronchodilators as ordered   3)Acute Hypoxic Respiratory Failure--- patient was apparently placed on home O2 but couple of weeks ago at 2 L via nasal cannula -No tobacco abuse -Currently requiring 2 L of oxygen suspect this is secondary to #1 and #2  above -Dyspnea and hypoxia improving with diuresis -Anticipate that we may have able to wean patient off O2 after improvement in #1 and #2 above   4)DM2-hold Farxiga and glimepiride -Use Novolog/Humalog Sliding scale insulin with Accu-Cheks/Fingersticks as ordered    5)HTN-stage II--  BP is improving -c/n Amlodipine and Coreg -C/n IV Lasix -May use IV azelastine as needed elevated blood pressure   6)Morbid Obesity/OSA--- PTA patient was on home CPAP --continue same -Low calorie diet, portion control and increase physical activity discussed with patient - 6)GERD--c/n Protonix as ordered   7)Depression and Anxiety--- stable, c/n citalopram and hydroxyzine   DVT prophylaxis--patient apparently cannot have subcu heparin or Lovenox due to alpha-gal allergy--- advised to use teds and SCDs and mobilize frequently to prevent DVT   Status is: Inpatient Remains inpatient appropriate because:     Intake/Output Summary (Last 24 hours) at 11/10/2022 [id number] Last data filed at 11/10/2022 0600 Gross per 24 hour  Intake 460 ml  Output 2950 ml  Net -2490 ml   Status is: Inpatient   Disposition: The patient is from: Home              Anticipated d/c is to: Home              Anticipated d/c date is: 1 day              Patient currently is not medically stable to d/c. Barriers: Not Clinically Stable-   Hampton Status :  -  Hampton Status: Full Hampton   Family Communication:   Discussed with wife at bedside  DVT Prophylaxis  :   - SCDs   SCDs  Start: 11/09/22 1716 Place TED hose Start: 11/09/22 1716 Place TED hose Start: 11/09/22 1657   Lab Results  Component Value Date   PLT 122 (L) 11/09/2022    Inpatient Medications  Scheduled Meds:  amLODipine  5 mg Oral Daily   carvedilol  6.25 mg Oral BID WC   citalopram  20 mg Oral Daily   dextromethorphan-guaiFENesin  1 tablet Oral BID   furosemide  40 mg Intravenous BID   insulin aspart  0-5 Units Subcutaneous QHS   insulin aspart  0-9 Units  Subcutaneous TID WC   ipratropium-albuterol  3 mL Nebulization TID PC   sodium chloride flush  3 mL Intravenous Q12H   sodium chloride flush  3 mL Intravenous Q12H   traZODone  100 mg Oral QHS   Continuous Infusions:  sodium chloride     azithromycin 500 mg (11/09/22 1831)   cefTRIAXone (ROCEPHIN)  IV Stopped (11/09/22 1800)   PRN Meds:.sodium chloride, acetaminophen **OR** acetaminophen, albuterol, bisacodyl, hydrALAZINE, ondansetron **OR** ondansetron (ZOFRAN) IV, polyethylene glycol, sodium chloride flush, traZODone   Anti-infectives (From admission, onward)    Start     Dose/Rate Route Frequency Ordered Stop   11/09/22 1730  cefTRIAXone (ROCEPHIN) 2 g in sodium chloride 0.9 % 100 mL IVPB        2 g 200 mL/hr over 30 Minutes Intravenous Every 24 hours 11/09/22 1633     11/09/22 1730  azithromycin (ZITHROMAX) 500 mg in sodium chloride 0.9 % 250 mL IVPB        500 mg 250 mL/hr over 60 Minutes Intravenous Every 24 hours 11/09/22 1633           Subjective: Sheralyn Boatman today has no fevers, no emesis,  No chest pain,   -Wife at bedside, patient apparently had a restless night/sleep overnight -Voiding well -Did not use BiPAP until around 3 AM last night -Dyspnea and orthopnea improving =-Hypoxia improving   Objective: Vitals:   11/10/22 0123 11/10/22 0332 11/10/22 0406 11/10/22 0636  BP: (!) 165/92  (!) 159/79   Pulse: (!) 102 75 75   Resp: 18 16 (!) 24   Temp: 97.7 F (36.5 C)  98.5 F (36.9 C)   TempSrc: Oral     SpO2: 95% 95% 98%   Weight:    116.4 kg    Intake/Output Summary (Last 24 hours) at 11/10/2022 0828 Last data filed at 11/10/2022 0600 Gross per 24 hour  Intake 460 ml  Output 2950 ml  Net -2490 ml   Filed Weights   11/10/22 0636  Weight: 116.4 kg   Physical Exam Gen:- Awake Alert, no conversational dyspnea, HEENT:- [place].AT, No sclera icterus Nose- [place] 2L/min Neck-Supple Neck, JVD less elevated  lungs-improving air movement, no wheezing  CV- S1,  S2 normal, regular , 2/6/SM Abd-  +ve B.Sounds, Abd Soft, No tenderness, increased truncal adiposity Extremity/Skin:-Improving Pitting edema, pedal pulses present  Psych-affect is appropriate, oriented x3 Neuro-no new focal deficits, no tremors  Data Reviewed: I have personally reviewed following labs and imaging studies  CBC: Recent Labs  Lab 11/09/22 1120  WBC 6.6  NEUTROABS 4.8  HGB 12.7*  HCT 42.0  MCV 97.0  PLT 123XX123*   Basic Metabolic Panel: Recent Labs  Lab 11/09/22 1120 11/10/22 0516  NA 140 143  K 4.6 3.8  CL 103 103  CO2 29 33*  GLUCOSE 230* 163*  BUN 17 16  CREATININE 0.93 0.90  CALCIUM 8.6* 8.5*   GFR: Estimated Creatinine Clearance: 91.9 mL/min (by  C-G formula based on SCr of 0.9 mg/dL). Liver Function Tests: Recent Labs  Lab 11/09/22 1120 11/10/22 0516  AST 34 22  ALT 55* 50*  ALKPHOS 83 76  BILITOT 0.6 0.5  PROT 6.2* 6.2*  ALBUMIN 3.1* 3.0*   Recent Results (from the past 240 hour(s))  Resp panel by RT-PCR (RSV, Flu A&B, Covid) Anterior Nasal Swab     Status: None   Collection Time: 11/09/22 11:20 AM   Specimen: Anterior Nasal Swab  Result Value Ref Range Status   SARS Coronavirus 2 by RT PCR NEGATIVE NEGATIVE Final    Comment: (NOTE) SARS-CoV-2 target nucleic acids are NOT DETECTED.  The SARS-CoV-2 RNA is generally detectable in upper respiratory specimens during the acute phase of infection. The lowest concentration of SARS-CoV-2 viral copies this assay can detect is 138 copies/mL. A negative result does not preclude SARS-Cov-2 infection and should not be used as the sole basis for treatment or other patient management decisions. A negative result may occur with  improper specimen collection/handling, submission of specimen other than nasopharyngeal swab, presence of viral mutation(s) within the areas targeted by this assay, and inadequate number of viral copies(<138 copies/mL). A negative result must be combined with clinical  observations, patient history, and epidemiological information. The expected result is Negative.  Fact Sheet for Patients:  EntrepreneurPulse.com.au  Fact Sheet for Healthcare Providers:  IncredibleEmployment.be  This test is no t yet approved or cleared by the [place] FDA and  has been authorized for detection and/or diagnosis of SARS-CoV-2 by FDA under an Emergency Use Authorization (EUA). This EUA will remain  in effect (meaning this test can be used) for the duration of the COVID-19 declaration under Section 564(b)(1) of the Act, 21 U.S.C.section 360bbb-3(b)(1), unless the authorization is terminated  or revoked sooner.       Influenza A by PCR NEGATIVE NEGATIVE Final   Influenza B by PCR NEGATIVE NEGATIVE Final    Comment: (NOTE) The Xpert Xpress SARS-CoV-2/FLU/RSV plus assay is intended as an aid in the diagnosis of influenza from Nasopharyngeal swab specimens and should not be used as a sole basis for treatment. Nasal washings and aspirates are unacceptable for Xpert Xpress SARS-CoV-2/FLU/RSV testing.  Fact Sheet for Patients: EntrepreneurPulse.com.au  Fact Sheet for Healthcare Providers: IncredibleEmployment.be  This test is not yet approved or cleared by the [place] FDA and has been authorized for detection and/or diagnosis of SARS-CoV-2 by FDA under an Emergency Use Authorization (EUA). This EUA will remain in effect (meaning this test can be used) for the duration of the COVID-19 declaration under Section 564(b)(1) of the Act, 21 U.S.C. section 360bbb-3(b)(1), unless the authorization is terminated or revoked.     Resp Syncytial Virus by PCR NEGATIVE NEGATIVE Final    Comment: (NOTE) Fact Sheet for Patients: EntrepreneurPulse.com.au  Fact Sheet for Healthcare Providers: IncredibleEmployment.be  This test is not yet approved or cleared by  the [place] FDA and has been authorized for detection and/or diagnosis of SARS-CoV-2 by FDA under an Emergency Use Authorization (EUA). This EUA will remain in effect (meaning this test can be used) for the duration of the COVID-19 declaration under Section 564(b)(1) of the Act, 21 U.S.C. section 360bbb-3(b)(1), unless the authorization is terminated or revoked.  Performed at [facility], [address], [place], [place] [address]     Radiology Studies: CT Angio Chest Pulmonary Embolism (PE) W or WO Contrast  Result Date: 11/09/2022 CLINICAL DATA:  CHF exacerbation Shortness of breath EXAM: CT ANGIOGRAPHY  CHEST WITH CONTRAST TECHNIQUE: Multidetector CT imaging of the chest was performed using the standard protocol during bolus administration of intravenous contrast. Multiplanar CT image reconstructions and MIPs were obtained to evaluate the vascular anatomy. RADIATION DOSE REDUCTION: This exam was performed according to the departmental dose-optimization program which includes automated exposure control, adjustment of the mA and/or kV according to patient size and/or use of iterative reconstruction technique. CONTRAST:  161m OMNIPAQUE IOHEXOL 350 MG/ML SOLN COMPARISON:  None available FINDINGS: Cardiovascular: Heart is mildly enlarged. No pulmonary artery embolism. Mediastinum/Nodes: No enlarged mediastinal, axillary, hilar lymph nodes. Mild mediastinal lipomatosis. Lungs/Pleura: Trace left and small right pleural effusions with adjacent atelectasis. Atypical patchy pleural-based opacity of the right upper lobe best seen on image 29 of series 4, measuring approximately 2.3 x 0.8 cm. Upper Abdomen: No acute abnormality. Musculoskeletal: Multiple left rib deformities consistent with remote healed fractures. Review of the MIP images confirms the above findings. IMPRESSION: 1. No pulmonary artery embolism. 2. Mild cardiomegaly. 3. Trace left and small right pleural effusions with adjacent  atelectasis. 4. Atypical patchy pleural-based opacity of the right upper lobe best seen on image 29 of series 4, measuring approximately 2.3 x 0.8 cm. This is likely due to focal atelectasis/pneumonitis, however given the atypical appearance a follow-up chest CT should be performed in 12 weeks to document resolution. Electronically Signed   By: FMiachel RouxM.D.   On: 11/09/2022 15:42   DG Chest 2 View  Result Date: 11/09/2022 CLINICAL DATA:  Shortness of breath. EXAM: CHEST - 2 VIEW COMPARISON:  02/02/2016 FINDINGS: The heart is enlarged. Prominent mediastinal and hilar contours. Central vascular congestion, interstitial pulmonary edema and small bilateral pleural effusions consistent with CHF. No pulmonary infiltrates. Remote healed rib fractures and left clavicle fracture. IMPRESSION: CHF with small bilateral pleural effusions. Electronically Signed   By: PMarijo SanesM.D.   On: 11/09/2022 12:11    Scheduled Meds:  amLODipine  5 mg Oral Daily   carvedilol  6.25 mg Oral BID WC   citalopram  20 mg Oral Daily   dextromethorphan-guaiFENesin  1 tablet Oral BID   furosemide  40 mg Intravenous BID   insulin aspart  0-5 Units Subcutaneous QHS   insulin aspart  0-9 Units Subcutaneous TID WC   ipratropium-albuterol  3 mL Nebulization TID PC   sodium chloride flush  3 mL Intravenous Q12H   sodium chloride flush  3 mL Intravenous Q12H   traZODone  100 mg Oral QHS   Continuous Infusions:  sodium chloride     azithromycin 500 mg (11/09/22 1831)   cefTRIAXone (ROCEPHIN)  IV Stopped (11/09/22 1800)    LOS: 1 day   CRoxan HockeyM.D on 11/10/2022 at 8:28 AM  Go to [email or web address] - for contact info  [facility] - Office  [phone] If 7PM-7AM, please contact night-coverage [email or web address] 11/10/2022, 8:28 AM

## 2022-11-10 NOTE — Progress Notes (Signed)
*  PRELIMINARY RESULTS* Echocardiogram 2D Echocardiogram has been performed with Definity.  Noah Hampton 11/10/2022, 12:41 PM

## 2022-11-10 NOTE — Progress Notes (Signed)
   11/10/22 1400  ReDS Vest / Clip  Station Marker D  Ruler Value 35  ReDS Actual Value 43

## 2022-11-10 NOTE — Progress Notes (Signed)
Patient requested CPAP ,

## 2022-11-11 ENCOUNTER — Other Ambulatory Visit (HOSPITAL_COMMUNITY): Payer: Self-pay

## 2022-11-11 DIAGNOSIS — G4733 Obstructive sleep apnea (adult) (pediatric): Secondary | ICD-10-CM

## 2022-11-11 DIAGNOSIS — I5021 Acute systolic (congestive) heart failure: Secondary | ICD-10-CM

## 2022-11-11 DIAGNOSIS — I824Y3 Acute embolism and thrombosis of unspecified deep veins of proximal lower extremity, bilateral: Secondary | ICD-10-CM

## 2022-11-11 DIAGNOSIS — I509 Heart failure, unspecified: Secondary | ICD-10-CM | POA: Diagnosis not present

## 2022-11-11 DIAGNOSIS — J9601 Acute respiratory failure with hypoxia: Secondary | ICD-10-CM

## 2022-11-11 LAB — ECHOCARDIOGRAM COMPLETE
Area-P 1/2: 4.8 cm2
S' Lateral: 4.3 cm
Weight: 4105.85 oz

## 2022-11-11 LAB — RENAL FUNCTION PANEL
Albumin: 3 g/dL — ABNORMAL LOW (ref 3.5–5.0)
Anion gap: 7 (ref 5–15)
BUN: 20 mg/dL (ref 8–23)
CO2: 35 mmol/L — ABNORMAL HIGH (ref 22–32)
Calcium: 8.6 mg/dL — ABNORMAL LOW (ref 8.9–10.3)
Chloride: 100 mmol/L (ref 98–111)
Creatinine, Ser: 0.99 mg/dL (ref 0.61–1.24)
GFR, Estimated: >60 mL/min (ref >60)
Glucose, Bld: 151 mg/dL — ABNORMAL HIGH (ref 70–99)
Phosphorus: 4.5 mg/dL (ref 2.5–4.6)
Potassium: 3.7 mmol/L (ref 3.5–5.1)
Sodium: 142 mmol/L (ref 135–145)

## 2022-11-11 LAB — GLUCOSE, CAPILLARY
Glucose-Capillary: 215 mg/dL — ABNORMAL HIGH (ref 70–99)
Glucose-Capillary: 241 mg/dL — ABNORMAL HIGH (ref 70–99)
Glucose-Capillary: 176 mg/dL — ABNORMAL HIGH (ref 70–99)
Glucose-Capillary: 164 mg/dL — ABNORMAL HIGH (ref 70–99)

## 2022-11-11 MED ORDER — APIXABAN 5 MG PO TABS: 5.0000 mg | ORAL_TABLET | Freq: Two times a day (BID) | ORAL | Status: DC

## 2022-11-11 MED ORDER — SACUBITRIL-VALSARTAN 24-26 MG PO TABS
1.0000 | ORAL_TABLET | Freq: Two times a day (BID) | ORAL | Status: DC
  Administered 2022-11-11 – 2022-11-15 (×9): 1 via ORAL
  Filled 2022-11-11 (×9): qty 1

## 2022-11-11 MED ORDER — APIXABAN 5 MG PO TABS
10.0000 mg | ORAL_TABLET | Freq: Two times a day (BID) | ORAL | Status: DC
  Administered 2022-11-11 – 2022-11-15 (×9): 10 mg via ORAL
  Filled 2022-11-11 (×9): qty 2

## 2022-11-11 NOTE — Progress Notes (Signed)
Mobility Specialist Progress Note:   11/11/22 0850  Mobility  Activity Ambulated with assistance in hallway  Level of Assistance Contact guard assist, steadying assist  Assistive Device None  Distance Ambulated (ft) 160 ft  Activity Response Tolerated well  Mobility Referral Yes  $Mobility charge 1 Mobility   Pt was agreeable for mobility session. SpO2 91% on 2L (baseline). No AD required, CGA for safety. 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 Tolerated session well, audible SOB during ambulation, SpO2 89% on 2L near EOS. Pt recovered in chair, SpO2 94% on 2L. Left pt in care of NT and wife, all needs met.   Royetta Crochet Mobility Specialist Please contact via Solicitor or  Rehab office at [phone]

## 2022-11-11 NOTE — Discharge Instructions (Signed)
Information on my medicine - ELIQUIS (apixaban)  Why was Eliquis prescribed for you? Eliquis was prescribed to treat blood clots that may have been found in the veins of your legs (deep vein thrombosis) or in your lungs (pulmonary embolism) and to reduce the risk of them occurring again.  What do You need to know about Eliquis ? The starting dose is 10 mg (two 5 mg tablets) taken TWICE daily for the FIRST SEVEN (7) DAYS, then on 11/18/22  the dose is reduced to ONE 5 mg tablet taken TWICE daily.  Eliquis may be taken with or without food.   Try to take the dose about the same time in the morning and in the evening. If you have difficulty swallowing the tablet whole please discuss with your pharmacist how to take the medication safely.  Take Eliquis exactly as prescribed and DO NOT stop taking Eliquis without talking to the doctor who prescribed the medication.  Stopping may increase your risk of developing a new blood clot.  Refill your prescription before you run out.  After discharge, you should have regular check-up appointments with your healthcare provider that is prescribing your Eliquis.    What do you do if you miss a dose? If a dose of ELIQUIS is not taken at the scheduled time, take it as soon as possible on the same day and twice-daily administration should be resumed. The dose should not be doubled to make up for a missed dose.  Important Safety Information A possible side effect of Eliquis is bleeding. You should call your healthcare provider right away if you experience any of the following: Bleeding from an injury or your nose that does not stop. Unusual colored urine (red or dark brown) or unusual colored stools (red or black). Unusual bruising for unknown reasons. A serious fall or if you hit your head (even if there is no bleeding).  Some medicines may interact with Eliquis and might increase your risk of bleeding or clotting while on Eliquis. To help avoid this,  consult your healthcare provider or pharmacist prior to using any new prescription or non-prescription medications, including herbals, vitamins, non-steroidal anti-inflammatory drugs (NSAIDs) and supplements.  This website has more information on Eliquis (apixaban): http://www.eliquis.com/eliquis/home

## 2022-11-11 NOTE — Progress Notes (Signed)
   11/11/22 1100  ReDS Vest / Clip  Station Marker D  Ruler Value 37  ReDS Actual Value 46   Verified x3 by 2 nurses.
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
## 2022-11-11 NOTE — Progress Notes (Signed)
Patient refused IV restart by Donney Rankins, RN charge until pain is controlled. MD notified.

## 2022-11-11 NOTE — TOC Benefit Eligibility Note (Addendum)
Patient Teacher, English as a foreign language completed.    The patient is currently admitted and upon discharge could be taking Eliquis 5 mg.  The current 30 day co-pay is $40.00.   The patient is currently admitted and upon discharge could be taking Entresto 24-26 mg.  The current 30 day co-pay is $40.00.   The patient is insured through [employer], [place] Patient Advocate Specialist [place] Patient Advocate Team Direct Number: [phone]  Fax: [phone]

## 2022-11-11 NOTE — TOC Progression Note (Signed)
Transition of Care [facility]) - Progression Note    Patient Details  Name: Yousof Midgley MRN: [id number] Date of Birth: [birth year]  Transition of Care [facility]) CM/SW Contact  Salome Arnt, [place] Phone Number: 11/11/2022, 1:03 PM  Clinical Narrative:   Transition of Care (TOC) Screening Note   Patient Details  Name: Hueston Margrave Date of Birth: July 09, 1951   Transition of Care [facility]) CM/SW Contact:    Salome Arnt, [place] Phone Number: 11/11/2022, 1:03 PM    Transition of Care Department [facility]) has reviewed patient and no TOC needs have been identified at this time. We will continue to monitor patient advancement through interdisciplinary progression rounds. If new patient transition needs arise, please place a TOC consult.         Barriers to Discharge: Continued Medical Work up  Expected Discharge Plan and Services                                               Social Determinants of Health (SDOH) Interventions [place]: No Food Insecurity (07/23/2022)  Housing: Low Risk  (07/23/2022)  Transportation Needs: No Transportation Needs (07/23/2022)  Utilities: Not At Risk (07/23/2022)  Tobacco Use: Low Risk  (07/24/2022)    Readmission Risk Interventions     No data to display

## 2022-11-11 NOTE — Progress Notes (Signed)
PROGRESS NOTE    Noah Hampton  [id number] DOB: June 03, 1951 DOA: 11/09/2022 PCP: [name], DO   Brief Narrative:    72 y.o. male with past medical history relevant for morbid obesity/OSA, HTN, DM2, HLD, depression GERD and gout admitted on 11/09/2022 with acute hypoxic respiratory failure in the setting of acute CHF exacerbation and possible pneumonia.  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  Assessment & Plan:   Principal Problem:   Acute exacerbation of CHF (congestive heart failure) (HCC) Active Problems:   Uncontrolled hypertension   Uncontrolled type 2 diabetes mellitus with hyperglycemia, without long-term current use of insulin (HCC)   Pneumonitis   Obesity-BMI 41   Sleep apnea-on C-pap  Assessment and Plan:  1)Acute systolic congestive heart failure -Prior echo from 2017 with preserved EF of 60 to 65% at that time, follows up with Dr. Darral Dash in [place] echocardiogram results with LVEF 25-30%, appreciate cardiology evaluation to take place 2/19 -Venous Dopplers with new bilateral DVTs and started on Eliquis   2)Possible Community-Acquired Pneumonia--- -CT chest without acute PE but does show pneumonitis --Flu, COVID and RSV negative -No leukocytosis or fevers -Cough has been persistent C/n Rocephin/Azithromycin, mucolytics and bronchodilators as ordered   3)Acute Hypoxic Respiratory Failure--- patient was apparently placed on home O2 but couple of weeks ago at 2 L via nasal cannula -No tobacco abuse -Currently requiring 2 L of oxygen suspect this is secondary to #1 and #2 above -Dyspnea and hypoxia improving with diuresis -Anticipate that we may have able to wean patient off O2 after improvement in #1 and #2 above   4)DM2-hold Farxiga and glimepiride -Use Novolog/Humalog Sliding scale insulin with Accu-Cheks/Fingersticks as ordered    5)HTN-stage II--  BP is  improving -c/n Amlodipine and Coreg -C/n IV Lasix -May use IV azelastine as needed elevated blood pressure   6)Morbid Obesity/OSA--- PTA patient was on home CPAP --continue same -Low calorie diet, portion control and increase physical activity discussed with patient - 6)GERD--c/n Protonix as ordered   7)Depression and Anxiety--- stable, c/n citalopram and hydroxyzine   8) Bilateral DVTs-noted on ultrasound and started on Eliquis 2/19    DVT prophylaxis:Eliquis Code Status: Full Family Communication: Wife at bedside 2/19 Disposition Plan:  Status is: Inpatient Remains inpatient appropriate because: Ongoing need for IV medications   Consultants:  Cardiology  Procedures:  None  Antimicrobials:  Anti-infectives (From admission, onward)    Start     Dose/Rate Route Frequency Ordered Stop   11/09/22 1730  cefTRIAXone (ROCEPHIN) 2 g in sodium chloride 0.9 % 100 mL IVPB        2 g 200 mL/hr over 30 Minutes Intravenous Every 24 hours 11/09/22 1633     11/09/22 1730  azithromycin (ZITHROMAX) 500 mg in sodium chloride 0.9 % 250 mL IVPB        500 mg 250 mL/hr over 60 Minutes Intravenous Every 24 hours 11/09/22 1633         Subjective: Patient seen and evaluated today with no new acute complaints or concerns. No acute concerns or events noted overnight.  Objective: Vitals:   11/11/22 0601 11/11/22 0700 11/11/22 0731 11/11/22 0913  BP: 137/64   (!) 145/84  Pulse: 76   73  Resp: 19   18  Temp: 98.3 F (36.8 C)   98.7 F (37.1 C)  TempSrc: Oral   Oral  SpO2: 96%  97%   Weight: 114.9 kg  114.7 kg    Height: 5' 8"$  (1.727 m) 5' 8"$  (1.727 m)      Intake/Output Summary (Last 24 hours) at 11/11/2022 1210 Last data filed at 11/11/2022 0913 Gross per 24 hour  Intake 1080 ml  Output 1775 ml  Net -695 ml   Filed Weights   11/10/22 0844 11/11/22 0601 11/11/22 0700  Weight: 115.8 kg 114.9 kg 114.7 kg    Examination:  General exam: Appears calm and comfortable   Respiratory system: Clear to auscultation. Respiratory effort normal. Cardiovascular system: S1 & S2 heard, RRR.  Gastrointestinal system: Abdomen is soft Central nervous system: Alert and awake Extremities: No edema Skin: No significant lesions noted Psychiatry: Flat affect.    Data Reviewed: I have personally reviewed following labs and imaging studies  CBC: Recent Labs  Lab 11/09/22 1120  WBC 6.6  NEUTROABS 4.8  HGB 12.7*  HCT 42.0  MCV 97.0  PLT 123XX123*   Basic Metabolic Panel: Recent Labs  Lab 11/09/22 1120 11/10/22 0516 11/11/22 0541  NA 140 143 142  K 4.6 3.8 3.7  CL 103 103 100  CO2 29 33* 35*  GLUCOSE 230* 163* 151*  BUN 17 16 20  $ CREATININE 0.93 0.90 0.99  CALCIUM 8.6* 8.5* 8.6*  PHOS  --   --  4.5   GFR: Estimated Creatinine Clearance: 82.9 mL/min (by C-G formula based on SCr of 0.99 mg/dL). Liver Function Tests: Recent Labs  Lab 11/09/22 1120 11/10/22 0516 11/11/22 0541  AST 34 22  --   ALT 55* 50*  --   ALKPHOS 83 76  --   BILITOT 0.6 0.5  --   PROT 6.2* 6.2*  --   ALBUMIN 3.1* 3.0* 3.0*   No results for input(s): "LIPASE", "AMYLASE" in the last 168 hours. No results for input(s): "AMMONIA" in the last 168 hours. Coagulation Profile: No results for input(s): "INR", "PROTIME" in the last 168 hours. Cardiac Enzymes: No results for input(s): "CKTOTAL", "CKMB", "CKMBINDEX", "TROPONINI" in the last 168 hours. BNP (last 3 results) No results for input(s): "PROBNP" in the last 8760 hours. HbA1C: No results for input(s): "HGBA1C" in the last 72 hours. CBG: Recent Labs  Lab 11/10/22 1156 11/10/22 1621 11/10/22 2115 11/11/22 0812 11/11/22 1105  GLUCAP 181* 133* 165* 215* 241*   Lipid Profile: No results for input(s): "CHOL", "HDL", "LDLCALC", "TRIG", "CHOLHDL", "LDLDIRECT" in the last 72 hours. Thyroid Function Tests: No results for input(s): "TSH", "T4TOTAL", "FREET4", "T3FREE", "THYROIDAB" in the last 72 hours. Anemia Panel: No  results for input(s): "VITAMINB12", "FOLATE", "FERRITIN", "TIBC", "IRON", "RETICCTPCT" in the last 72 hours. Sepsis Labs: No results for input(s): "PROCALCITON", "LATICACIDVEN" in the last 168 hours.  Recent Results (from the past 240 hour(s))  Resp panel by RT-PCR (RSV, Flu A&B, Covid) Anterior Nasal Swab     Status: None   Collection Time: 11/09/22 11:20 AM   Specimen: Anterior Nasal Swab  Result Value Ref Range Status   SARS Coronavirus 2 by RT PCR NEGATIVE NEGATIVE Final    Comment: (NOTE) SARS-CoV-2 target nucleic acids are NOT DETECTED.  The SARS-CoV-2 RNA is generally detectable in upper respiratory specimens during the acute phase of infection. The lowest concentration of SARS-CoV-2 viral copies this assay can detect is 138 copies/mL. A negative result does not preclude SARS-Cov-2 infection and should not be used as the sole basis for treatment or other patient management decisions. A negative result may occur with  improper specimen collection/handling, submission of specimen other than nasopharyngeal swab, presence  of viral mutation(s) within the areas targeted by this assay, and inadequate number of viral copies(<138 copies/mL). A negative result must be combined with clinical observations, patient history, and epidemiological information. The expected result is Negative.  Fact Sheet for Patients:  EntrepreneurPulse.com.au  Fact Sheet for Healthcare Providers:  IncredibleEmployment.be  This test is no t yet approved or cleared by the [place] FDA and  has been authorized for detection and/or diagnosis of SARS-CoV-2 by FDA under an Emergency Use Authorization (EUA). This EUA will remain  in effect (meaning this test can be used) for the duration of the COVID-19 declaration under Section 564(b)(1) of the Act, 21 U.S.C.section 360bbb-3(b)(1), unless the authorization is terminated  or revoked sooner.       Influenza A by PCR  NEGATIVE NEGATIVE Final   Influenza B by PCR NEGATIVE NEGATIVE Final    Comment: (NOTE) The Xpert Xpress SARS-CoV-2/FLU/RSV plus assay is intended as an aid in the diagnosis of influenza from Nasopharyngeal swab specimens and should not be used as a sole basis for treatment. Nasal washings and aspirates are unacceptable for Xpert Xpress SARS-CoV-2/FLU/RSV testing.  Fact Sheet for Patients: EntrepreneurPulse.com.au  Fact Sheet for Healthcare Providers: IncredibleEmployment.be  This test is not yet approved or cleared by the [place] FDA and has been authorized for detection and/or diagnosis of SARS-CoV-2 by FDA under an Emergency Use Authorization (EUA). This EUA will remain in effect (meaning this test can be used) for the duration of the COVID-19 declaration under Section 564(b)(1) of the Act, 21 U.S.C. section 360bbb-3(b)(1), unless the authorization is terminated or revoked.     Resp Syncytial Virus by PCR NEGATIVE NEGATIVE Final    Comment: (NOTE) Fact Sheet for Patients: EntrepreneurPulse.com.au  Fact Sheet for Healthcare Providers: IncredibleEmployment.be  This test is not yet approved or cleared by the [place] FDA and has been authorized for detection and/or diagnosis of SARS-CoV-2 by FDA under an Emergency Use Authorization (EUA). This EUA will remain in effect (meaning this test can be used) for the duration of the COVID-19 declaration under Section 564(b)(1) of the Act, 21 U.S.C. section 360bbb-3(b)(1), unless the authorization is terminated or revoked.  Performed at [facility], [address], [place], [place] [address]          Radiology Studies: ECHOCARDIOGRAM COMPLETE  Result Date: 11/11/2022    ECHOCARDIOGRAM REPORT   Patient Name:   Noah Hampton Date of Exam: 11/10/2022 Medical Rec #:  [id number]   Height:       68.0 in Accession #:    [id number]  Weight:       256.6 lb  Date of Birth:  06/29/1951  BSA:          2.272 m Patient Age:    11 years    BP:           159/82 mmHg Patient Gender: M           HR:           85 bpm. Exam Location:  [facility] Procedure: 2D Echo, Cardiac Doppler and Color Doppler Indications:    R06.00 Dyspnea  History:        Patient has prior history of Echocardiogram examinations, most                 recent 02/02/2016. CHF; Risk Factors:Hypertension, Diabetes and                 Dyslipidemia. Obesity-BMI 41, Sleep apnea-on C-pap.  Sonographer:  Alvino Chapel RCS Referring Phys: [phone] COURAGE EMOKPAE IMPRESSIONS  1. Left ventricular ejection fraction, by estimation, is 25 to 30%. The left ventricle has severely decreased function. The left ventricle demonstrates global hypokinesis. There is mild left ventricular hypertrophy. Left ventricular diastolic parameters  are consistent with Grade I diastolic dysfunction (impaired relaxation).  2. Right ventricular systolic function is low normal. The right ventricular size is normal. Tricuspid regurgitation signal is inadequate for assessing PA pressure.  3. Left atrial size was moderately dilated.  4. The mitral valve is normal in structure. No evidence of mitral valve regurgitation. No evidence of mitral stenosis.  5. The aortic valve is tricuspid. There is moderate calcification of the aortic valve. Aortic valve regurgitation is not visualized. No aortic stenosis is present.  6. Aortic dilatation noted. There is mild dilatation of the aortic root, measuring 40 mm.  7. The inferior vena cava is dilated in size with >50% respiratory variability, suggesting right atrial pressure of 8 mmHg. Comparison(s): No prior Echocardiogram. FINDINGS  Left Ventricle: Left ventricular ejection fraction, by estimation, is 25 to 30%. The left ventricle has severely decreased function. The left ventricle demonstrates global hypokinesis. Definity contrast agent was given IV to delineate the left ventricular endocardial borders.  The left ventricular internal cavity size was normal in size. There is mild left ventricular hypertrophy. Abnormal (paradoxical) septal motion, consistent with left bundle branch block. Left ventricular diastolic parameters are consistent with Grade I diastolic dysfunction (impaired relaxation). Right Ventricle: The right ventricular size is normal. No increase in right ventricular wall thickness. Right ventricular systolic function is low normal. Tricuspid regurgitation signal is inadequate for assessing PA pressure. Left Atrium: Left atrial size was moderately dilated. Right Atrium: Right atrial size was normal in size. Pericardium: There is no evidence of pericardial effusion. Mitral Valve: The mitral valve is normal in structure. No evidence of mitral valve regurgitation. No evidence of mitral valve stenosis. Tricuspid Valve: The tricuspid valve is not well visualized. Tricuspid valve regurgitation is not demonstrated. No evidence of tricuspid stenosis. Aortic Valve: The aortic valve is tricuspid. There is moderate calcification of the aortic valve. Aortic valve regurgitation is not visualized. No aortic stenosis is present. Pulmonic Valve: The pulmonic valve was not well visualized. Pulmonic valve regurgitation is trivial. No evidence of pulmonic stenosis. Aorta: Aortic dilatation noted. There is mild dilatation of the aortic root, measuring 40 mm. Venous: The inferior vena cava is dilated in size with greater than 50% respiratory variability, suggesting right atrial pressure of 8 mmHg. IAS/Shunts: No atrial level shunt detected by color flow Doppler.  LEFT VENTRICLE PLAX 2D LVIDd:         5.20 cm   Diastology LVIDs:         4.30 cm   LV e' medial:    4.90 cm/s LV PW:         1.30 cm   LV E/e' medial:  17.0 LV IVS:        1.30 cm   LV e' lateral:   7.18 cm/s LVOT diam:     2.10 cm   LV E/e' lateral: 11.6 LV SV:         59 LV SV Index:   26 LVOT Area:     3.46 cm  RIGHT VENTRICLE RV S prime:     11.10 cm/s  TAPSE (M-mode): 2.4 cm LEFT ATRIUM             Index        RIGHT  ATRIUM           Index LA diam:        4.20 cm 1.85 cm/m   RA Area:     17.20 cm LA Vol (A2C):   69.1 ml 30.41 ml/m  RA Volume:   44.10 ml  19.41 ml/m LA Vol (A4C):   99.8 ml 43.92 ml/m LA Biplane Vol: 86.8 ml 38.20 ml/m  AORTIC VALVE LVOT Vmax:   95.90 cm/s LVOT Vmean:  56.300 cm/s LVOT VTI:    0.171 m  AORTA Ao Root diam: 4.00 cm MITRAL VALVE MV Area (PHT): 4.80 cm    SHUNTS MV Decel Time: 158 msec    Systemic VTI:  0.17 m MV E velocity: 83.30 cm/s  Systemic Diam: 2.10 cm MV A velocity: 66.00 cm/s MV E/A ratio:  1.26 Vishnu Priya Mallipeddi Electronically signed by Lorelee Cover Mallipeddi Signature Date/Time: 11/11/2022/11:34:08 AM    Final    US Venous Img Lower Bilateral (DVT)  Result Date: 11/10/2022 CLINICAL DATA:  [id number] Pulmonary emboli (Chunky) [id number] EXAM: BILATERAL LOWER EXTREMITY VENOUS DOPPLER ULTRASOUND TECHNIQUE: Gray-scale sonography with graded compression, as well as color Doppler and duplex ultrasound were performed to evaluate the lower extremity deep venous systems from the level of the common femoral vein and including the common femoral, femoral, profunda femoral, popliteal and calf veins including the posterior tibial, peroneal and gastrocnemius veins when visible. The superficial great saphenous vein was also interrogated. Spectral Doppler was utilized to evaluate flow at rest and with distal augmentation maneuvers in the common femoral, femoral and popliteal veins. COMPARISON:  LEFT lower extremity venous duplex, 11/14/2017 FINDINGS: RIGHT LOWER EXTREMITY VENOUS Heterogeneously echogenic partially-occlusive filling defect within the visualized portions of the RIGHT calf veins, specifically within the posterior tibial and peroneal veins. Normal compressibility of the RIGHT common femoral, superficial femoral, and popliteal veins. Visualized portions of profunda femoral vein and great saphenous vein unremarkable. OTHER  No evidence of superficial thrombophlebitis or abnormal fluid collection. Limitations: none LEFT LOWER EXTREMITY VENOUS Heterogeneously echogenic, partially-occlusive filling defect within the LEFT femoral and popliteal veins. Additional venous filling defects are also noted within the visualized portions of the calf veins, within the paired posterior tibial veins. Normal compressibility of the LEFT common femoral vein. Visualized portions of profunda femoral vein and great saphenous vein unremarkable. OTHER No evidence of superficial thrombophlebitis or abnormal fluid collection. Limitations: none IMPRESSION: Examination is POSITIVE for bilateral lower extremity DVT, femoropopliteal on the LEFT and within the PTV and peroneal veins on the RIGHT. Michaelle Birks, MD [facility] Radiology Electronically Signed   By: Michaelle Birks M.D.   On: 11/10/2022 13:32   CT Angio Chest Pulmonary Embolism (PE) W or WO Contrast  Result Date: 11/09/2022 CLINICAL DATA:  CHF exacerbation Shortness of breath EXAM: CT ANGIOGRAPHY CHEST WITH CONTRAST TECHNIQUE: Multidetector CT imaging of the chest was performed using the standard protocol during bolus administration of intravenous contrast. Multiplanar CT image reconstructions and MIPs were obtained to evaluate the vascular anatomy. RADIATION DOSE REDUCTION: This exam was performed according to the departmental dose-optimization program which includes automated exposure control, adjustment of the mA and/or kV according to patient size and/or use of iterative reconstruction technique. CONTRAST:  140m OMNIPAQUE IOHEXOL 350 MG/ML SOLN COMPARISON:  None available FINDINGS: Cardiovascular: Heart is mildly enlarged. No pulmonary artery embolism. Mediastinum/Nodes: No enlarged mediastinal, axillary, hilar lymph nodes. Mild mediastinal lipomatosis. Lungs/Pleura: Trace left and small right pleural effusions with adjacent atelectasis. Atypical  patchy  pleural-based opacity of the right upper lobe best seen on image 29 of series 4, measuring approximately 2.3 x 0.8 cm. Upper Abdomen: No acute abnormality. Musculoskeletal: Multiple left rib deformities consistent with remote healed fractures. Review of the MIP images confirms the above findings. IMPRESSION: 1. No pulmonary artery embolism. 2. Mild cardiomegaly. 3. Trace left and small right pleural effusions with adjacent atelectasis. 4. Atypical patchy pleural-based opacity of the right upper lobe best seen on image 29 of series 4, measuring approximately 2.3 x 0.8 cm. This is likely due to focal atelectasis/pneumonitis, however given the atypical appearance a follow-up chest CT should be performed in 12 weeks to document resolution. Electronically Signed   By: Miachel Roux M.D.   On: 11/09/2022 15:42        Scheduled Meds:  amLODipine  5 mg Oral Daily   apixaban  10 mg Oral BID   Followed by   Derrill Memo ON 11/18/2022] apixaban  5 mg Oral BID   carvedilol  6.25 mg Oral BID WC   citalopram  20 mg Oral Daily   dextromethorphan-guaiFENesin  1 tablet Oral BID   furosemide  40 mg Intravenous BID   insulin aspart  0-5 Units Subcutaneous QHS   insulin aspart  0-9 Units Subcutaneous TID WC   ipratropium-albuterol  3 mL Nebulization TID PC   sodium chloride flush  3 mL Intravenous Q12H   sodium chloride flush  3 mL Intravenous Q12H   traZODone  100 mg Oral QHS   Continuous Infusions:  sodium chloride     azithromycin Stopped (11/10/22 2040)   cefTRIAXone (ROCEPHIN)  IV Stopped (11/10/22 1911)     LOS: 2 days    Time spent: 35 minutes    Fue Cervenka Darleen Crocker, DO [facility]  If 7PM-7AM, please contact night-coverage [email or web address] 11/11/2022, 12:10 PM

## 2022-11-11 NOTE — Progress Notes (Signed)
I got a call from the nursing secretary that the patient's IV site was hurting. I entered the room. The patient told me, "it hurting bad like a needle stick". I assessed the site and found it to be swollen. I removed the patient's IV catheter, found intact. The patient continued to complain of pain at the site. I gave the patient an ice pack to place on the site. I was called back to the patient's room. The patient was sitting in a chair at the bedside crying stating, " this is hurting so bad, you have to take it out".  I reminded the patient that the IV is out of his arm and there is only tape and gauze at the site to keep it from bleeding. The patients wife place a cold wash cloth on the patients arm. MD and charge notified.

## 2022-11-12 ENCOUNTER — Encounter (HOSPITAL_COMMUNITY): Payer: Self-pay | Admitting: Family Medicine

## 2022-11-12 DIAGNOSIS — I509 Heart failure, unspecified: Secondary | ICD-10-CM | POA: Diagnosis not present

## 2022-11-12 DIAGNOSIS — I5A Non-ischemic myocardial injury (non-traumatic): Secondary | ICD-10-CM

## 2022-11-12 DIAGNOSIS — I5021 Acute systolic (congestive) heart failure: Secondary | ICD-10-CM

## 2022-11-12 DIAGNOSIS — M10071 Idiopathic gout, right ankle and foot: Secondary | ICD-10-CM

## 2022-11-12 DIAGNOSIS — J9601 Acute respiratory failure with hypoxia: Secondary | ICD-10-CM

## 2022-11-12 LAB — GLUCOSE, CAPILLARY
Glucose-Capillary: 166 mg/dL — ABNORMAL HIGH (ref 70–99)
Glucose-Capillary: 237 mg/dL — ABNORMAL HIGH (ref 70–99)
Glucose-Capillary: 277 mg/dL — ABNORMAL HIGH (ref 70–99)
Glucose-Capillary: 246 mg/dL — ABNORMAL HIGH (ref 70–99)

## 2022-11-12 LAB — BASIC METABOLIC PANEL
Anion gap: 8 (ref 5–15)
BUN: 20 mg/dL (ref 8–23)
CO2: 32 mmol/L (ref 22–32)
Calcium: 8.6 mg/dL — ABNORMAL LOW (ref 8.9–10.3)
Chloride: 98 mmol/L (ref 98–111)
Creatinine, Ser: 0.89 mg/dL (ref 0.61–1.24)
GFR, Estimated: >60 mL/min (ref >60)
Glucose, Bld: 155 mg/dL — ABNORMAL HIGH (ref 70–99)
Potassium: 3.5 mmol/L (ref 3.5–5.1)
Sodium: 138 mmol/L (ref 135–145)

## 2022-11-12 LAB — CBC
HCT: 43.7 % (ref 39.0–52.0)
Hemoglobin: 13.6 g/dL (ref 13.0–17.0)
MCH: 29.8 pg (ref 26.0–34.0)
MCHC: 31.1 g/dL (ref 30.0–36.0)
MCV: 95.6 fL (ref 80.0–100.0)
Platelets: 157 10*3/uL (ref 150–400)
RBC: 4.57 MIL/uL (ref 4.22–5.81)
RDW: 13.7 % (ref 11.5–15.5)
WBC: 7.8 10*3/uL (ref 4.0–10.5)
nRBC: 0 % (ref 0.0–0.2)

## 2022-11-12 LAB — MAGNESIUM: Magnesium: 1.9 mg/dL (ref 1.7–2.4)

## 2022-11-12 LAB — SEDIMENTATION RATE: Sed Rate: 24 mm/hr — ABNORMAL HIGH (ref 0–16)

## 2022-11-12 LAB — URIC ACID: Uric Acid, Serum: 9.7 mg/dL — ABNORMAL HIGH (ref 3.7–8.6)

## 2022-11-12 LAB — C-REACTIVE PROTEIN: CRP: 3.5 mg/dL — ABNORMAL HIGH (ref <1.0)

## 2022-11-12 MED ORDER — IPRATROPIUM-ALBUTEROL 0.5-2.5 (3) MG/3ML IN SOLN
3.0000 mL | Freq: Two times a day (BID) | RESPIRATORY_TRACT | Status: DC
  Administered 2022-11-12 – 2022-11-14 (×5): 3 mL via RESPIRATORY_TRACT
  Filled 2022-11-12 (×5): qty 3

## 2022-11-12 MED ORDER — SPIRONOLACTONE 25 MG PO TABS
25.0000 mg | ORAL_TABLET | Freq: Every day | ORAL | Status: DC
  Administered 2022-11-12 – 2022-11-15 (×4): 25 mg via ORAL
  Filled 2022-11-12 (×4): qty 1

## 2022-11-12 MED ORDER — PREDNISONE 20 MG PO TABS
40.0000 mg | ORAL_TABLET | Freq: Every day | ORAL | Status: DC
  Administered 2022-11-12 – 2022-11-13 (×2): 40 mg via ORAL
  Filled 2022-11-12 (×2): qty 2

## 2022-11-12 MED ORDER — HYDROMORPHONE HCL 1 MG/ML IJ SOLN
0.5000 mg | INTRAMUSCULAR | Status: DC | PRN
  Administered 2022-11-12: 0.5 mg via INTRAVENOUS
  Filled 2022-11-12: qty 0.5

## 2022-11-12 MED ORDER — DAPAGLIFLOZIN PROPANEDIOL 10 MG PO TABS
10.0000 mg | ORAL_TABLET | Freq: Every day | ORAL | Status: DC
  Administered 2022-11-12 – 2022-11-15 (×4): 10 mg via ORAL
  Filled 2022-11-12 (×4): qty 1

## 2022-11-12 MED ORDER — FUROSEMIDE 10 MG/ML IJ SOLN
80.0000 mg | Freq: Three times a day (TID) | INTRAMUSCULAR | Status: DC
  Administered 2022-11-12 – 2022-11-14 (×6): 80 mg via INTRAVENOUS
  Filled 2022-11-12 (×6): qty 8

## 2022-11-12 MED ORDER — COLCHICINE 0.6 MG PO TABS
0.6000 mg | ORAL_TABLET | Freq: Two times a day (BID) | ORAL | Status: DC
  Administered 2022-11-12 – 2022-11-15 (×7): 0.6 mg via ORAL
  Filled 2022-11-12 (×7): qty 1

## 2022-11-12 NOTE — Progress Notes (Addendum)
Mobility Specialist Progress Note:    11/12/22 1400  Mobility  Activity Refused mobility  Mobility Referral Yes   Pt politely refused mobility session d/t gout pain/ swelling in right foot. Will f/u with pt tomorrow.   Noah Hampton Mobility Specialist Please contact via Solicitor or  Rehab office at [phone]

## 2022-11-12 NOTE — Progress Notes (Addendum)
Progress Note  Patient Name: Noah Hampton Date of Encounter: 11/12/2022  Primary Cardiologist: Raechel Chute, MD  Subjective   Patient has excruciating pain during my interview, said he has gout flare in his right foot near the fifth toe.  Denies any SOB, leg swelling.   Inpatient Medications    Scheduled Meds:  apixaban  10 mg Oral BID   Followed by   Derrill Memo ON 11/18/2022] apixaban  5 mg Oral BID   carvedilol  6.25 mg Oral BID WC   citalopram  20 mg Oral Daily   dextromethorphan-guaiFENesin  1 tablet Oral BID   furosemide  40 mg Intravenous BID   insulin aspart  0-5 Units Subcutaneous QHS   insulin aspart  0-9 Units Subcutaneous TID WC   ipratropium-albuterol  3 mL Nebulization TID PC   sacubitril-valsartan  1 tablet Oral BID   sodium chloride flush  3 mL Intravenous Q12H   sodium chloride flush  3 mL Intravenous Q12H   traZODone  100 mg Oral QHS   Continuous Infusions:  sodium chloride     azithromycin Stopped (11/11/22 2010)   cefTRIAXone (ROCEPHIN)  IV Stopped (11/11/22 1902)   PRN Meds: sodium chloride, acetaminophen **OR** acetaminophen, albuterol, bisacodyl, hydrALAZINE, ondansetron **OR** ondansetron (ZOFRAN) IV, polyethylene glycol, sodium chloride flush   Vital Signs    Vitals:   11/11/22 2007 11/11/22 2100 11/12/22 0613 11/12/22 0714  BP:  (!) 112/58 (!) 160/74   Pulse:  64 74   Resp:  18    Temp:  (!) 97.5 F (36.4 C) 97.7 F (36.5 C)   TempSrc:  Oral Oral   SpO2: 95% 96% 97% 98%  Weight:      Height:        Intake/Output Summary (Last 24 hours) at 11/12/2022 0847 Last data filed at 11/12/2022 0713 Gross per 24 hour  Intake 929.27 ml  Output 1125 ml  Net -195.73 ml      11/11/2022    7:00 AM 11/11/2022    6:01 AM 11/10/2022    8:44 AM  Last 3 Weights  Weight (lbs) 252 lb 12.8 oz 253 lb 4.9 oz 255 lb 4.8 oz  Weight (kg) 114.669 kg 114.9 kg 115.803 kg     Telemetry    NSR, one blocked PAC with brief pause <2 sec; on 2/19 had bradycardia  upper 30s during sleeping hours - Personally Reviewed  Physical Exam   GEN: No acute distress. Mildly SOB in long sentences HEENT: Normocephalic, atraumatic, sclera non-icteric. Neck: No JVD or bruits. Cardiac: RRR no murmurs, rubs, or gallops.  Respiratory: DIminished at bases otherwise clear to auscultation bilaterally. Breathing is unlabored. GI: Soft, nontender, non-distended, BS +x 4. MS: no deformity. Extremities: No clubbing or cyanosis. Trace sockline edema. Distal pedal pulses are 2+ and equal bilaterally. Neuro:  AAOx3. Follows commands. Psych:  Responds to questions appropriately with a normal affect.  Labs    High Sensitivity Troponin:   Recent Labs  Lab 11/09/22 1120 11/09/22 1323  TROPONINIHS 24* 26*      Cardiac EnzymesNo results for input(s): "TROPONINI" in the last 168 hours. No results for input(s): "TROPIPOC" in the last 168 hours.   Chemistry Recent Labs  Lab 11/09/22 1120 11/10/22 0516 11/11/22 0541 11/12/22 0447  NA 140 143 142 138  K 4.6 3.8 3.7 3.5  CL 103 103 100 98  CO2 29 33* 35* 32  GLUCOSE 230* 163* 151* 155*  BUN 17 16 20 20  $ CREATININE 0.93 0.90 0.99 0.89  CALCIUM 8.6* 8.5* 8.6* 8.6*  PROT 6.2* 6.2*  --   --   ALBUMIN 3.1* 3.0* 3.0*  --   AST 34 22  --   --   ALT 55* 50*  --   --   ALKPHOS 83 76  --   --   BILITOT 0.6 0.5  --   --   GFRNONAA >60 >60 >60 >60  ANIONGAP 8 7 7 8     $ Hematology Recent Labs  Lab 11/09/22 1120 11/12/22 0447  WBC 6.6 7.8  RBC 4.33 4.57  HGB 12.7* 13.6  HCT 42.0 43.7  MCV 97.0 95.6  MCH 29.3 29.8  MCHC 30.2 31.1  RDW 13.5 13.7  PLT 122* 157    BNP Recent Labs  Lab 11/09/22 1120  BNP 436.0*     DDimer No results for input(s): "DDIMER" in the last 168 hours.   Radiology    ECHOCARDIOGRAM COMPLETE  Result Date: 11/11/2022    ECHOCARDIOGRAM REPORT   Patient Name:   Noah Hampton Date of Exam: 11/10/2022 Medical Rec #:  [id number]   Height:       68.0 in Accession #:    [id number]  Weight:        256.6 lb Date of Birth:  1951/01/07  BSA:          2.272 m Patient Age:    72 years    BP:           159/82 mmHg Patient Gender: M           HR:           85 bpm. Exam Location:  [facility] Procedure: 2D Echo, Cardiac Doppler and Color Doppler Indications:    R06.00 Dyspnea  History:        Patient has prior history of Echocardiogram examinations, most                 recent 02/02/2016. CHF; Risk Factors:Hypertension, Diabetes and                 Dyslipidemia. Obesity-BMI 41, Sleep apnea-on C-pap.  Sonographer:    Alvino Chapel RCS Referring Phys: [phone] COURAGE EMOKPAE IMPRESSIONS  1. Left ventricular ejection fraction, by estimation, is 25 to 30%. The left ventricle has severely decreased function. The left ventricle demonstrates global hypokinesis. There is mild left ventricular hypertrophy. Left ventricular diastolic parameters  are consistent with Grade I diastolic dysfunction (impaired relaxation).  2. Right ventricular systolic function is low normal. The right ventricular size is normal. Tricuspid regurgitation signal is inadequate for assessing PA pressure.  3. Left atrial size was moderately dilated.  4. The mitral valve is normal in structure. No evidence of mitral valve regurgitation. No evidence of mitral stenosis.  5. The aortic valve is tricuspid. There is moderate calcification of the aortic valve. Aortic valve regurgitation is not visualized. No aortic stenosis is present.  6. Aortic dilatation noted. There is mild dilatation of the aortic root, measuring 40 mm.  7. The inferior vena cava is dilated in size with >50% respiratory variability, suggesting right atrial pressure of 8 mmHg. Comparison(s): No prior Echocardiogram. FINDINGS  Left Ventricle: Left ventricular ejection fraction, by estimation, is 25 to 30%. The left ventricle has severely decreased function. The left ventricle demonstrates global hypokinesis. Definity contrast agent was given IV to delineate the left ventricular  endocardial borders. The left ventricular internal cavity size was normal in size. There is mild left ventricular hypertrophy. Abnormal (paradoxical) septal  motion, consistent with left bundle branch block. Left ventricular diastolic parameters are consistent with Grade I diastolic dysfunction (impaired relaxation). Right Ventricle: The right ventricular size is normal. No increase in right ventricular wall thickness. Right ventricular systolic function is low normal. Tricuspid regurgitation signal is inadequate for assessing PA pressure. Left Atrium: Left atrial size was moderately dilated. Right Atrium: Right atrial size was normal in size. Pericardium: There is no evidence of pericardial effusion. Mitral Valve: The mitral valve is normal in structure. No evidence of mitral valve regurgitation. No evidence of mitral valve stenosis. Tricuspid Valve: The tricuspid valve is not well visualized. Tricuspid valve regurgitation is not demonstrated. No evidence of tricuspid stenosis. Aortic Valve: The aortic valve is tricuspid. There is moderate calcification of the aortic valve. Aortic valve regurgitation is not visualized. No aortic stenosis is present. Pulmonic Valve: The pulmonic valve was not well visualized. Pulmonic valve regurgitation is trivial. No evidence of pulmonic stenosis. Aorta: Aortic dilatation noted. There is mild dilatation of the aortic root, measuring 40 mm. Venous: The inferior vena cava is dilated in size with greater than 50% respiratory variability, suggesting right atrial pressure of 8 mmHg. IAS/Shunts: No atrial level shunt detected by color flow Doppler.  LEFT VENTRICLE PLAX 2D LVIDd:         5.20 cm   Diastology LVIDs:         4.30 cm   LV e' medial:    4.90 cm/s LV PW:         1.30 cm   LV E/e' medial:  17.0 LV IVS:        1.30 cm   LV e' lateral:   7.18 cm/s LVOT diam:     2.10 cm   LV E/e' lateral: 11.6 LV SV:         59 LV SV Index:   26 LVOT Area:     3.46 cm  RIGHT VENTRICLE RV S  prime:     11.10 cm/s TAPSE (M-mode): 2.4 cm LEFT ATRIUM             Index        RIGHT ATRIUM           Index LA diam:        4.20 cm 1.85 cm/m   RA Area:     17.20 cm LA Vol (A2C):   69.1 ml 30.41 ml/m  RA Volume:   44.10 ml  19.41 ml/m LA Vol (A4C):   99.8 ml 43.92 ml/m LA Biplane Vol: 86.8 ml 38.20 ml/m  AORTIC VALVE LVOT Vmax:   95.90 cm/s LVOT Vmean:  56.300 cm/s LVOT VTI:    0.171 m  AORTA Ao Root diam: 4.00 cm MITRAL VALVE MV Area (PHT): 4.80 cm    SHUNTS MV Decel Time: 158 msec    Systemic VTI:  0.17 m MV E velocity: 83.30 cm/s  Systemic Diam: 2.10 cm MV A velocity: 66.00 cm/s MV E/A ratio:  1.26 Tyshell Ramberg Priya Jamez Ambrocio Electronically signed by Lorelee Cover Audrena Talaga Signature Date/Time: 11/11/2022/11:34:08 AM    Final    US Venous Img Lower Bilateral (DVT)  Result Date: 11/10/2022 CLINICAL DATA:  [id number] Pulmonary emboli ([place]) [id number] EXAM: BILATERAL LOWER EXTREMITY VENOUS DOPPLER ULTRASOUND TECHNIQUE: Gray-scale sonography with graded compression, as well as color Doppler and duplex ultrasound were performed to evaluate the lower extremity deep venous systems from the level of the common femoral vein and including the common femoral, femoral, profunda femoral, popliteal and calf veins including the  posterior tibial, peroneal and gastrocnemius veins when visible. The superficial great saphenous vein was also interrogated. Spectral Doppler was utilized to evaluate flow at rest and with distal augmentation maneuvers in the common femoral, femoral and popliteal veins. COMPARISON:  LEFT lower extremity venous duplex, 11/14/2017 FINDINGS: RIGHT LOWER EXTREMITY VENOUS Heterogeneously echogenic partially-occlusive filling defect within the visualized portions of the RIGHT calf veins, specifically within the posterior tibial and peroneal veins. Normal compressibility of the RIGHT common femoral, superficial femoral, and popliteal veins. Visualized portions of profunda femoral vein and great saphenous vein  unremarkable. OTHER No evidence of superficial thrombophlebitis or abnormal fluid collection. Limitations: none LEFT LOWER EXTREMITY VENOUS Heterogeneously echogenic, partially-occlusive filling defect within the LEFT femoral and popliteal veins. Additional venous filling defects are also noted within the visualized portions of the calf veins, within the paired posterior tibial veins. Normal compressibility of the LEFT common femoral vein. Visualized portions of profunda femoral vein and great saphenous vein unremarkable. OTHER No evidence of superficial thrombophlebitis or abnormal fluid collection. Limitations: none IMPRESSION: Examination is POSITIVE for bilateral lower extremity DVT, femoropopliteal on the LEFT and within the PTV and peroneal veins on the RIGHT. Michaelle Birks, MD [facility] Radiology Electronically Signed   By: Michaelle Birks M.D.   On: 11/10/2022 13:32    Cardiac Studies   2d echo 10/2022     1. Left ventricular ejection fraction, by estimation, is 25 to 30%. The  left ventricle has severely decreased function. The left ventricle  demonstrates global hypokinesis. There is mild left ventricular  hypertrophy. Left ventricular diastolic parameters   are consistent with Grade I diastolic dysfunction (impaired relaxation).   2. Right ventricular systolic function is low normal. The right  ventricular size is normal. Tricuspid regurgitation signal is inadequate  for assessing PA pressure.   3. Left atrial size was moderately dilated.   4. The mitral valve is normal in structure. No evidence of mitral valve  regurgitation. No evidence of mitral stenosis.   5. The aortic valve is tricuspid. There is moderate calcification of the  aortic valve. Aortic valve regurgitation is not visualized. No aortic  stenosis is present.   6. Aortic dilatation noted. There is mild dilatation of the aortic root,  measuring 40 mm.   7. The inferior vena  cava is dilated in size with >50% respiratory  variability, suggesting right atrial pressure of 8 mmHg.   Comparison(s): No prior Echocardiogram.    Patient Profile     72 y.o. male with HTN, NIDDM, dyslipidemia, obesity, OSA-on C-pap, remote stress test/cath in [place] said to be OK in [place], [place] normal 2017 presented with 1 month hx of worsening DOE, PND, BLE edema. Followed by outside cardiologist with recent med adjustment, also seen by pulmonologist and put on nighttime O2. Symptoms progressed prompting admission to the hospital found to have acute hypoxic respiratory failure and possible PNA. EKG also showing progression of LBBB.  Assessment & Plan    1.  Acute gout flare -Continue colchicine 0.6 mg twice daily -Primary team started steroids, cautious use in the setting of HF -Patient will need to be started on low-dose allopurinol due to frequent recurrences of gout. Outpatient rheumatology follow-up.  2. Acute systolic heart failure exacerbation, LVEF 25 to 30% (new onset) -ReDS 46 today. Switch IV Lasix from 40 mg to 80 mg 3 times a day. -Continue carvedilol 6.25 mg twice daily -Continue Entresto 24-26 mg twice daily -Start spironolactone 25 mg once daily -Start  Farxiga 10 mg once daily -Obtain [facility] records from [facility] cardiology can be done as outpatient as well.  3. Acute hypoxic respiratory failure due to CHF +/- PNA - CTA negative for PE, trace L, small R pleural effusions, atypical patchy opacity RUL (?atx vs pneumonitis, will need f/u CT in 12 weeks per radiology - needs attention to this on follow-up) - Covid/flu neg - primary team treating with abx  4.  Bilateral lower extremity DVT (femoropopliteal on the left and within the PTV and peroneal veins on the right)  - by duplex 10/2022 - now on Eliquis DVT dosing parameters  5. Essential HTN -Continue carvedilol, Entresto and start spironolactone (mainly for cardiomyopathy)  6. OSA - per notes, patient was using  CPAP religiously every day but his pulmonologist recently switched his CPAP to home oxygen at nighttime.   7.  Elevated troponin from myocardial injury likely secondary to heart failure exacerbation, pneumonia -Outpatient cardiology follow-up  I have spent a total of 33 minutes with patient reviewing chart , telemetry, EKGs, labs and examining patient as well as establishing an assessment and plan that was discussed with the patient.  > 50% of time was spent in direct patient care.     Baby Stairs Fidel Levy, MD [place]  11:26 AM

## 2022-11-12 NOTE — Progress Notes (Signed)
Patient slept most the night. Patient IV was restarted by [facility]. Tolerated fair. Patient had no other complaints of pain or discomfort. Patient spouse remains at bedside. Plan of care ongoing.

## 2022-11-12 NOTE — Progress Notes (Addendum)
PROGRESS NOTE    Noah Hampton  [id number] DOB: 1951/05/31 DOA: 11/09/2022 PCP: [name], DO   Brief Narrative:    72 y.o. male with past medical history relevant for morbid obesity/OSA, HTN, DM2, HLD, depression GERD and gout admitted on 11/09/2022 with acute hypoxic respiratory failure in the setting of acute CHF exacerbation and possible pneumonia.  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  Assessment & Plan:   Principal Problem:   Acute exacerbation of CHF (congestive heart failure) (HCC) Active Problems:   Uncontrolled hypertension   Uncontrolled type 2 diabetes mellitus with hyperglycemia, without long-term current use of insulin (HCC)   Pneumonitis   Obesity-BMI 41   Sleep apnea-on C-pap  Assessment and Plan:  1)Acute systolic congestive heart failure -Prior echo from 2017 with preserved EF of 60 to 65% at that time, follows up with Dr. Darral Dash in [place], awaiting records -New echocardiogram results with LVEF 25-30%, appreciate cardiology evaluation to take place 2/19 -Venous Dopplers with new bilateral DVTs and started on Eliquis   2)Possible Community-Acquired Pneumonia--- -CT chest without acute PE but does show pneumonitis --Flu, COVID and RSV negative -No leukocytosis or fevers -Cough has been persistent C/n Rocephin/Azithromycin, mucolytics and bronchodilators as ordered; day 3/5   3)Acute Hypoxic Respiratory Failure--- patient was apparently placed on home O2 but couple of weeks ago at 2 L via nasal cannula -No tobacco abuse -Currently requiring 2 L of oxygen suspect this is secondary to #1 and #2 above -Dyspnea and hypoxia improving with diuresis -Anticipate that we may have able to wean patient off O2 after improvement in #1 and #2 above   4)DM2-hold Farxiga and glimepiride -Use Novolog/Humalog Sliding scale insulin with Accu-Cheks/Fingersticks as ordered     5)HTN-stage II--  BP is improving -c/n Coreg and amlodipine switched to [facility] per Cardiology 2/19 -C/n IV Lasix -May use IV azelastine as needed elevated blood pressure   6)Morbid Obesity/OSA--- PTA patient was on home CPAP --continue same -Low calorie diet, portion control and increase physical activity discussed with patient - 6)GERD--c/n Protonix as ordered   7)Depression and Anxiety--- stable, c/n citalopram and hydroxyzine   8) Bilateral DVTs-noted on ultrasound and started on Eliquis 2/19  9) possible acute gout flare to foot-started on prednisone 2/20.  Uric acid, ESR, and CRP pending  DVT prophylaxis:Eliquis Code Status: Full Family Communication: Wife at bedside 2/20 Disposition Plan:  Status is: Inpatient Remains inpatient appropriate because: Ongoing need for IV medications   Consultants:  Cardiology  Procedures:  None  Antimicrobials:  Anti-infectives (From admission, onward)    Start     Dose/Rate Route Frequency Ordered Stop   11/09/22 1730  cefTRIAXone (ROCEPHIN) 2 g in sodium chloride 0.9 % 100 mL IVPB        2 g 200 mL/hr over 30 Minutes Intravenous Every 24 hours 11/09/22 1633     11/09/22 1730  azithromycin (ZITHROMAX) 500 mg in sodium chloride 0.9 % 250 mL IVPB        500 mg 250 mL/hr over 60 Minutes Intravenous Every 24 hours 11/09/22 1633         Subjective: Patient seen and evaluated today with no new acute complaints or concerns. No acute concerns or events noted overnight.  He states that he is having improved urination and shortness of breath as well as lower extremity edema have improved.  He denies any chest pain.  Objective: Vitals:   11/11/22 2007  11/11/22 2100 11/12/22 0613 11/12/22 0714  BP:  (!) 112/58 (!) 160/74   Pulse:  64 74   Resp:  18    Temp:  (!) 97.5 F (36.4 C) 97.7 F (36.5 C)   TempSrc:  Oral Oral   SpO2: 95% 96% 97% 98%  Weight:      Height:        Intake/Output Summary (Last 24 hours) at 11/12/2022  0938 Last data filed at 11/12/2022 0713 Gross per 24 hour  Intake 686.27 ml  Output 750 ml  Net -63.73 ml   Filed Weights   11/10/22 0844 11/11/22 0601 11/11/22 0700  Weight: 115.8 kg 114.9 kg 114.7 kg    Examination:  General exam: Appears calm and comfortable  Respiratory system: Clear to auscultation. Respiratory effort normal. Cardiovascular system: S1 & S2 heard, RRR.  Gastrointestinal system: Abdomen is soft Central nervous system: Alert and awake Extremities: No edema Skin: No significant lesions noted Psychiatry: Flat affect.    Data Reviewed: I have personally reviewed following labs and imaging studies  CBC: Recent Labs  Lab 11/09/22 1120 11/12/22 0447  WBC 6.6 7.8  NEUTROABS 4.8  --   HGB 12.7* 13.6  HCT 42.0 43.7  MCV 97.0 95.6  PLT 122* [id number]   Basic Metabolic Panel: Recent Labs  Lab 11/09/22 1120 11/10/22 0516 11/11/22 0541 11/12/22 0447  NA 140 143 142 138  K 4.6 3.8 3.7 3.5  CL 103 103 100 98  CO2 29 33* 35* 32  GLUCOSE 230* 163* 151* 155*  BUN 17 16 20 20  $ CREATININE 0.93 0.90 0.99 0.89  CALCIUM 8.6* 8.5* 8.6* 8.6*  MG  --   --   --  1.9  PHOS  --   --  4.5  --    GFR: Estimated Creatinine Clearance: 92.2 mL/min (by C-G formula based on SCr of 0.89 mg/dL). Liver Function Tests: Recent Labs  Lab 11/09/22 1120 11/10/22 0516 11/11/22 0541  AST 34 22  --   ALT 55* 50*  --   ALKPHOS 83 76  --   BILITOT 0.6 0.5  --   PROT 6.2* 6.2*  --   ALBUMIN 3.1* 3.0* 3.0*   No results for input(s): "LIPASE", "AMYLASE" in the last 168 hours. No results for input(s): "AMMONIA" in the last 168 hours. Coagulation Profile: No results for input(s): "INR", "PROTIME" in the last 168 hours. Cardiac Enzymes: No results for input(s): "CKTOTAL", "CKMB", "CKMBINDEX", "TROPONINI" in the last 168 hours. BNP (last 3 results) No results for input(s): "PROBNP" in the last 8760 hours. HbA1C: No results for input(s): "HGBA1C" in the last 72  hours. CBG: Recent Labs  Lab 11/11/22 0812 11/11/22 1105 11/11/22 1648 11/11/22 2102 11/12/22 0738  GLUCAP 215* 241* 176* 164* 166*   Lipid Profile: No results for input(s): "CHOL", "HDL", "LDLCALC", "TRIG", "CHOLHDL", "LDLDIRECT" in the last 72 hours. Thyroid Function Tests: No results for input(s): "TSH", "T4TOTAL", "FREET4", "T3FREE", "THYROIDAB" in the last 72 hours. Anemia Panel: No results for input(s): "VITAMINB12", "FOLATE", "FERRITIN", "TIBC", "IRON", "RETICCTPCT" in the last 72 hours. Sepsis Labs: No results for input(s): "PROCALCITON", "LATICACIDVEN" in the last 168 hours.  Recent Results (from the past 240 hour(s))  Resp panel by RT-PCR (RSV, Flu A&B, Covid) Anterior Nasal Swab     Status: None   Collection Time: 11/09/22 11:20 AM   Specimen: Anterior Nasal Swab  Result Value Ref Range Status   SARS Coronavirus 2 by RT PCR NEGATIVE NEGATIVE Final  Comment: (NOTE) SARS-CoV-2 target nucleic acids are NOT DETECTED.  The SARS-CoV-2 RNA is generally detectable in upper respiratory specimens during the acute phase of infection. The lowest concentration of SARS-CoV-2 viral copies this assay can detect is 138 copies/mL. A negative result does not preclude SARS-Cov-2 infection and should not be used as the sole basis for treatment or other patient management decisions. A negative result may occur with  improper specimen collection/handling, submission of specimen other than nasopharyngeal swab, presence of viral mutation(s) within the areas targeted by this assay, and inadequate number of viral copies(<138 copies/mL). A negative result must be combined with clinical observations, patient history, and epidemiological information. The expected result is Negative.  Fact Sheet for Patients:  EntrepreneurPulse.com.au  Fact Sheet for Healthcare Providers:  IncredibleEmployment.be  This test is no t yet approved or cleared by the [place] FDA and  has been authorized for detection and/or diagnosis of SARS-CoV-2 by FDA under an Emergency Use Authorization (EUA). This EUA will remain  in effect (meaning this test can be used) for the duration of the COVID-19 declaration under Section 564(b)(1) of the Act, 21 U.S.C.section 360bbb-3(b)(1), unless the authorization is terminated  or revoked sooner.       Influenza A by PCR NEGATIVE NEGATIVE Final   Influenza B by PCR NEGATIVE NEGATIVE Final    Comment: (NOTE) The Xpert Xpress SARS-CoV-2/FLU/RSV plus assay is intended as an aid in the diagnosis of influenza from Nasopharyngeal swab specimens and should not be used as a sole basis for treatment. Nasal washings and aspirates are unacceptable for Xpert Xpress SARS-CoV-2/FLU/RSV testing.  Fact Sheet for Patients: EntrepreneurPulse.com.au  Fact Sheet for Healthcare Providers: IncredibleEmployment.be  This test is not yet approved or cleared by the [place] FDA and has been authorized for detection and/or diagnosis of SARS-CoV-2 by FDA under an Emergency Use Authorization (EUA). This EUA will remain in effect (meaning this test can be used) for the duration of the COVID-19 declaration under Section 564(b)(1) of the Act, 21 U.S.C. section 360bbb-3(b)(1), unless the authorization is terminated or revoked.     Resp Syncytial Virus by PCR NEGATIVE NEGATIVE Final    Comment: (NOTE) Fact Sheet for Patients: EntrepreneurPulse.com.au  Fact Sheet for Healthcare Providers: IncredibleEmployment.be  This test is not yet approved or cleared by the [place] FDA and has been authorized for detection and/or diagnosis of SARS-CoV-2 by FDA under an Emergency Use Authorization (EUA). This EUA will remain in effect (meaning this test can be used) for the duration of the COVID-19 declaration under Section 564(b)(1) of the Act, 21 U.S.C. section  360bbb-3(b)(1), unless the authorization is terminated or revoked.  Performed at [facility], [address] [place], [place] [address]          Radiology Studies: ECHOCARDIOGRAM COMPLETE  Result Date: 11/11/2022    ECHOCARDIOGRAM REPORT   Patient Name:   Noah Hampton Date of Exam: 11/10/2022 Medical Rec #:  [id number]   Height:       68.0 in Accession #:    [id number]  Weight:       256.6 lb Date of Birth:  March 07, 1951  BSA:          2.272 m Patient Age:    68 years    BP:           159/82 mmHg Patient Gender: M           HR:           85 bpm. Exam  Location:  [facility] Procedure: 2D Echo, Cardiac Doppler and Color Doppler Indications:    R06.00 Dyspnea  History:        Patient has prior history of Echocardiogram examinations, most                 recent 02/02/2016. CHF; Risk Factors:Hypertension, Diabetes and                 Dyslipidemia. Obesity-BMI 41, Sleep apnea-on C-pap.  Sonographer:    Alvino Chapel RCS Referring Phys: [phone] COURAGE EMOKPAE IMPRESSIONS  1. Left ventricular ejection fraction, by estimation, is 25 to 30%. The left ventricle has severely decreased function. The left ventricle demonstrates global hypokinesis. There is mild left ventricular hypertrophy. Left ventricular diastolic parameters  are consistent with Grade I diastolic dysfunction (impaired relaxation).  2. Right ventricular systolic function is low normal. The right ventricular size is normal. Tricuspid regurgitation signal is inadequate for assessing PA pressure.  3. Left atrial size was moderately dilated.  4. The mitral valve is normal in structure. No evidence of mitral valve regurgitation. No evidence of mitral stenosis.  5. The aortic valve is tricuspid. There is moderate calcification of the aortic valve. Aortic valve regurgitation is not visualized. No aortic stenosis is present.  6. Aortic dilatation noted. There is mild dilatation of the aortic root, measuring 40 mm.  7. The inferior vena cava is dilated in size  with >50% respiratory variability, suggesting right atrial pressure of 8 mmHg. Comparison(s): No prior Echocardiogram. FINDINGS  Left Ventricle: Left ventricular ejection fraction, by estimation, is 25 to 30%. The left ventricle has severely decreased function. The left ventricle demonstrates global hypokinesis. Definity contrast agent was given IV to delineate the left ventricular endocardial borders. The left ventricular internal cavity size was normal in size. There is mild left ventricular hypertrophy. Abnormal (paradoxical) septal motion, consistent with left bundle branch block. Left ventricular diastolic parameters are consistent with Grade I diastolic dysfunction (impaired relaxation). Right Ventricle: The right ventricular size is normal. No increase in right ventricular wall thickness. Right ventricular systolic function is low normal. Tricuspid regurgitation signal is inadequate for assessing PA pressure. Left Atrium: Left atrial size was moderately dilated. Right Atrium: Right atrial size was normal in size. Pericardium: There is no evidence of pericardial effusion. Mitral Valve: The mitral valve is normal in structure. No evidence of mitral valve regurgitation. No evidence of mitral valve stenosis. Tricuspid Valve: The tricuspid valve is not well visualized. Tricuspid valve regurgitation is not demonstrated. No evidence of tricuspid stenosis. Aortic Valve: The aortic valve is tricuspid. There is moderate calcification of the aortic valve. Aortic valve regurgitation is not visualized. No aortic stenosis is present. Pulmonic Valve: The pulmonic valve was not well visualized. Pulmonic valve regurgitation is trivial. No evidence of pulmonic stenosis. Aorta: Aortic dilatation noted. There is mild dilatation of the aortic root, measuring 40 mm. Venous: The inferior vena cava is dilated in size with greater than 50% respiratory variability, suggesting right atrial pressure of 8 mmHg. IAS/Shunts: No atrial  level shunt detected by color flow Doppler.  LEFT VENTRICLE PLAX 2D LVIDd:         5.20 cm   Diastology LVIDs:         4.30 cm   LV e' medial:    4.90 cm/s LV PW:         1.30 cm   LV E/e' medial:  17.0 LV IVS:        1.30 cm  LV e' lateral:   7.18 cm/s LVOT diam:     2.10 cm   LV E/e' lateral: 11.6 LV SV:         59 LV SV Index:   26 LVOT Area:     3.46 cm  RIGHT VENTRICLE RV S prime:     11.10 cm/s TAPSE (M-mode): 2.4 cm LEFT ATRIUM             Index        RIGHT ATRIUM           Index LA diam:        4.20 cm 1.85 cm/m   RA Area:     17.20 cm LA Vol (A2C):   69.1 ml 30.41 ml/m  RA Volume:   44.10 ml  19.41 ml/m LA Vol (A4C):   99.8 ml 43.92 ml/m LA Biplane Vol: 86.8 ml 38.20 ml/m  AORTIC VALVE LVOT Vmax:   95.90 cm/s LVOT Vmean:  56.300 cm/s LVOT VTI:    0.171 m  AORTA Ao Root diam: 4.00 cm MITRAL VALVE MV Area (PHT): 4.80 cm    SHUNTS MV Decel Time: 158 msec    Systemic VTI:  0.17 m MV E velocity: 83.30 cm/s  Systemic Diam: 2.10 cm MV A velocity: 66.00 cm/s MV E/A ratio:  1.26 Vishnu Priya Mallipeddi Electronically signed by Lorelee Cover Mallipeddi Signature Date/Time: 11/11/2022/11:34:08 AM    Final    US Venous Img Lower Bilateral (DVT)  Result Date: 11/10/2022 CLINICAL DATA:  [id number] Pulmonary emboli ([place]) [id number] EXAM: BILATERAL LOWER EXTREMITY VENOUS DOPPLER ULTRASOUND TECHNIQUE: Gray-scale sonography with graded compression, as well as color Doppler and duplex ultrasound were performed to evaluate the lower extremity deep venous systems from the level of the common femoral vein and including the common femoral, femoral, profunda femoral, popliteal and calf veins including the posterior tibial, peroneal and gastrocnemius veins when visible. The superficial great saphenous vein was also interrogated. Spectral Doppler was utilized to evaluate flow at rest and with distal augmentation maneuvers in the common femoral, femoral and popliteal veins. COMPARISON:  LEFT lower extremity venous duplex,  11/14/2017 FINDINGS: RIGHT LOWER EXTREMITY VENOUS Heterogeneously echogenic partially-occlusive filling defect within the visualized portions of the RIGHT calf veins, specifically within the posterior tibial and peroneal veins. Normal compressibility of the RIGHT common femoral, superficial femoral, and popliteal veins. Visualized portions of profunda femoral vein and great saphenous vein unremarkable. OTHER No evidence of superficial thrombophlebitis or abnormal fluid collection. Limitations: none LEFT LOWER EXTREMITY VENOUS Heterogeneously echogenic, partially-occlusive filling defect within the LEFT femoral and popliteal veins. Additional venous filling defects are also noted within the visualized portions of the calf veins, within the paired posterior tibial veins. Normal compressibility of the LEFT common femoral vein. Visualized portions of profunda femoral vein and great saphenous vein unremarkable. OTHER No evidence of superficial thrombophlebitis or abnormal fluid collection. Limitations: none IMPRESSION: Examination is POSITIVE for bilateral lower extremity DVT, femoropopliteal on the LEFT and within the PTV and peroneal veins on the RIGHT. Michaelle Birks, MD [facility] Electronically Signed   By: Michaelle Birks M.D.   On: 11/10/2022 13:32        Scheduled Meds:  apixaban  10 mg Oral BID   Followed by   Derrill Memo ON 11/18/2022] apixaban  5 mg Oral BID   carvedilol  6.25 mg Oral BID WC   citalopram  20 mg Oral Daily   dextromethorphan-guaiFENesin  1 tablet Oral BID  furosemide  40 mg Intravenous BID   insulin aspart  0-5 Units Subcutaneous QHS   insulin aspart  0-9 Units Subcutaneous TID WC   ipratropium-albuterol  3 mL Nebulization TID PC   predniSONE  40 mg Oral Q breakfast   sacubitril-valsartan  1 tablet Oral BID   sodium chloride flush  3 mL Intravenous Q12H   sodium chloride flush  3 mL Intravenous Q12H   traZODone  100 mg Oral  QHS   Continuous Infusions:  sodium chloride     azithromycin Stopped (11/11/22 2010)   cefTRIAXone (ROCEPHIN)  IV Stopped (11/11/22 1902)     LOS: 3 days    Time spent: 35 minutes    Claris Pech Darleen Crocker, DO [facility]  If 7PM-7AM, please contact night-coverage [email or web address] 11/12/2022, 9:38 AM

## 2022-11-13 DIAGNOSIS — E7849 Other hyperlipidemia: Secondary | ICD-10-CM

## 2022-11-13 DIAGNOSIS — I509 Heart failure, unspecified: Secondary | ICD-10-CM | POA: Diagnosis not present

## 2022-11-13 DIAGNOSIS — I824Y3 Acute embolism and thrombosis of unspecified deep veins of proximal lower extremity, bilateral: Secondary | ICD-10-CM

## 2022-11-13 LAB — COMPREHENSIVE METABOLIC PANEL
ALT: 25 U/L (ref 0–44)
AST: 11 U/L — ABNORMAL LOW (ref 15–41)
Albumin: 3.1 g/dL — ABNORMAL LOW (ref 3.5–5.0)
Alkaline Phosphatase: 68 U/L (ref 38–126)
Anion gap: 10 (ref 5–15)
BUN: 24 mg/dL — ABNORMAL HIGH (ref 8–23)
CO2: 34 mmol/L — ABNORMAL HIGH (ref 22–32)
Calcium: 8.7 mg/dL — ABNORMAL LOW (ref 8.9–10.3)
Chloride: 96 mmol/L — ABNORMAL LOW (ref 98–111)
Creatinine, Ser: 1.1 mg/dL (ref 0.61–1.24)
GFR, Estimated: >60 mL/min (ref >60)
Glucose, Bld: 173 mg/dL — ABNORMAL HIGH (ref 70–99)
Potassium: 3.4 mmol/L — ABNORMAL LOW (ref 3.5–5.1)
Sodium: 140 mmol/L (ref 135–145)
Total Bilirubin: 0.7 mg/dL (ref 0.3–1.2)
Total Protein: 6.7 g/dL (ref 6.5–8.1)

## 2022-11-13 LAB — CBC
HCT: 42.7 % (ref 39.0–52.0)
Hemoglobin: 13.4 g/dL (ref 13.0–17.0)
MCH: 29.6 pg (ref 26.0–34.0)
MCHC: 31.4 g/dL (ref 30.0–36.0)
MCV: 94.3 fL (ref 80.0–100.0)
Platelets: 168 10*3/uL (ref 150–400)
RBC: 4.53 MIL/uL (ref 4.22–5.81)
RDW: 13.9 % (ref 11.5–15.5)
WBC: 11 10*3/uL — ABNORMAL HIGH (ref 4.0–10.5)
nRBC: 0 % (ref 0.0–0.2)

## 2022-11-13 LAB — LIPID PANEL
Cholesterol: 251 mg/dL — ABNORMAL HIGH (ref 0–200)
HDL: 38 mg/dL — ABNORMAL LOW (ref >40)
LDL Cholesterol: 172 mg/dL — ABNORMAL HIGH (ref 0–99)
Total CHOL/HDL Ratio: 6.6 RATIO
Triglycerides: 206 mg/dL — ABNORMAL HIGH (ref <150)
VLDL: 41 mg/dL — ABNORMAL HIGH (ref 0–40)

## 2022-11-13 LAB — GLUCOSE, CAPILLARY
Glucose-Capillary: 149 mg/dL — ABNORMAL HIGH (ref 70–99)
Glucose-Capillary: 316 mg/dL — ABNORMAL HIGH (ref 70–99)
Glucose-Capillary: 262 mg/dL — ABNORMAL HIGH (ref 70–99)
Glucose-Capillary: 188 mg/dL — ABNORMAL HIGH (ref 70–99)

## 2022-11-13 LAB — TSH: TSH: 0.553 u[IU]/mL (ref 0.350–4.500)

## 2022-11-13 MED ORDER — POTASSIUM CHLORIDE CRYS ER 20 MEQ PO TBCR
40.0000 meq | EXTENDED_RELEASE_TABLET | Freq: Once | ORAL | Status: AC
  Administered 2022-11-13: 40 meq via ORAL
  Filled 2022-11-13: qty 2

## 2022-11-13 NOTE — Progress Notes (Signed)
Mobility Specialist Progress Note:    11/13/22 0840  Mobility  Activity Refused mobility  Mobility Referral Yes   Pt politely refused mobility d/t gout pain and swelling in right foot. Left pt in bed, in company of wife, all needs met.   Royetta Crochet Mobility Specialist Please contact via Solicitor or  Rehab office at [phone]

## 2022-11-13 NOTE — Progress Notes (Signed)
Patient slept through this shift, no complaint of pain. Patient did request assistance to put Bipap back on this morning. Continued to monitor.

## 2022-11-13 NOTE — Progress Notes (Addendum)
Progress Note  Patient Name: Noah Hampton Date of Encounter: 11/13/2022  Primary Cardiologist: Raechel Chute, MD  Subjective   Breathing and edema improved. Still not quite back to baseline with breathing but better than yesterday. Gout pain persists walking to bathroom. ReDS vest 38 per nurse. Had -5.2L UOP yesterday total after IV Lasix increased.  Inpatient Medications    Scheduled Meds:  apixaban  10 mg Oral BID   Followed by   Derrill Memo ON 11/18/2022] apixaban  5 mg Oral BID   carvedilol  6.25 mg Oral BID WC   citalopram  20 mg Oral Daily   colchicine  0.6 mg Oral BID   dapagliflozin propanediol  10 mg Oral Daily   dextromethorphan-guaiFENesin  1 tablet Oral BID   furosemide  80 mg Intravenous TID   insulin aspart  0-5 Units Subcutaneous QHS   insulin aspart  0-9 Units Subcutaneous TID WC   ipratropium-albuterol  3 mL Nebulization BID   predniSONE  40 mg Oral Q breakfast   sacubitril-valsartan  1 tablet Oral BID   sodium chloride flush  3 mL Intravenous Q12H   sodium chloride flush  3 mL Intravenous Q12H   spironolactone  25 mg Oral Daily   traZODone  100 mg Oral QHS   Continuous Infusions:  sodium chloride     azithromycin 500 mg (11/12/22 1632)   cefTRIAXone (ROCEPHIN)  IV 2 g (11/12/22 1804)   PRN Meds: sodium chloride, acetaminophen **OR** acetaminophen, albuterol, bisacodyl, hydrALAZINE, HYDROmorphone (DILAUDID) injection, ondansetron **OR** ondansetron (ZOFRAN) IV, polyethylene glycol, sodium chloride flush   Vital Signs    Vitals:   11/13/22 0500 11/13/22 0519 11/13/22 0718 11/13/22 0934  BP:  125/68  125/66  Pulse:  66  74  Resp:  20  19  Temp:  (!) 97.2 F (36.2 C)  97.8 F (36.6 C)  TempSrc:    Oral  SpO2:  94% 95% 95%  Weight: 111.7 kg     Height:        Intake/Output Summary (Last 24 hours) at 11/13/2022 1036 Last data filed at 11/13/2022 1033 Gross per 24 hour  Intake 723 ml  Output 3600 ml  Net -2877 ml      11/13/2022    5:00 AM  11/12/2022    4:30 PM 11/11/2022    7:00 AM  Last 3 Weights  Weight (lbs) 246 lb 4.1 oz 246 lb 252 lb 12.8 oz  Weight (kg) 111.7 kg 111.585 kg 114.669 kg     Telemetry    NSR/SB occasional blocked PACs HR nadir upper 40s - Personally Reviewed  Physical Exam   GEN: No acute distress.  HEENT: Normocephalic, atraumatic, sclera non-icteric. Neck: JVD mildly elevated Cardiac: RRR no murmurs, rubs, or gallops.  Respiratory: Diminished at bases otherwise CTAB. Breathing is unlabored. GI: Soft, nontender, non-distended, BS +x 4. MS: no deformity. Extremities: No clubbing or cyanosis. Trace sockline edema. Distal pedal pulses are 2+ and equal bilaterally. Neuro:  AAOx3. Follows commands. Psych:  Responds to questions appropriately with a normal affect.  Labs    High Sensitivity Troponin:   Recent Labs  Lab 11/09/22 1120 11/09/22 1323  TROPONINIHS 24* 26*      Cardiac EnzymesNo results for input(s): "TROPONINI" in the last 168 hours. No results for input(s): "TROPIPOC" in the last 168 hours.   Chemistry Recent Labs  Lab 11/09/22 1120 11/10/22 0516 11/11/22 0541 11/12/22 0447 11/13/22 0425  NA 140 143 142 138 140  K 4.6 3.8 3.7 3.5 3.4*  CL 103 103 100 98 96*  CO2 29 33* 35* 32 34*  GLUCOSE 230* 163* 151* 155* 173*  BUN 17 16 20 20 $ 24*  CREATININE 0.93 0.90 0.99 0.89 1.10  CALCIUM 8.6* 8.5* 8.6* 8.6* 8.7*  PROT 6.2* 6.2*  --   --  6.7  ALBUMIN 3.1* 3.0* 3.0*  --  3.1*  AST 34 22  --   --  11*  ALT 55* 50*  --   --  25  ALKPHOS 83 76  --   --  68  BILITOT 0.6 0.5  --   --  0.7  GFRNONAA >60 >60 >60 >60 >60  ANIONGAP 8 7 7 8 10     $ Hematology Recent Labs  Lab 11/09/22 1120 11/12/22 0447 11/13/22 0425  WBC 6.6 7.8 11.0*  RBC 4.33 4.57 4.53  HGB 12.7* 13.6 13.4  HCT 42.0 43.7 42.7  MCV 97.0 95.6 94.3  MCH 29.3 29.8 29.6  MCHC 30.2 31.1 31.4  RDW 13.5 13.7 13.9  PLT 122* 157 168    BNP Recent Labs  Lab 11/09/22 1120  BNP 436.0*     DDimer No  results for input(s): "DDIMER" in the last 168 hours.   Radiology    No results found.  Cardiac Studies    2d echo 10/2022     1. Left ventricular ejection fraction, by estimation, is 25 to 30%. The  left ventricle has severely decreased function. The left ventricle  demonstrates global hypokinesis. There is mild left ventricular  hypertrophy. Left ventricular diastolic parameters   are consistent with Grade I diastolic dysfunction (impaired relaxation).   2. Right ventricular systolic function is low normal. The right  ventricular size is normal. Tricuspid regurgitation signal is inadequate  for assessing PA pressure.   3. Left atrial size was moderately dilated.   4. The mitral valve is normal in structure. No evidence of mitral valve  regurgitation. No evidence of mitral stenosis.   5. The aortic valve is tricuspid. There is moderate calcification of the  aortic valve. Aortic valve regurgitation is not visualized. No aortic  stenosis is present.   6. Aortic dilatation noted. There is mild dilatation of the aortic root,  measuring 40 mm.   7. The inferior vena cava is dilated in size with >50% respiratory  variability, suggesting right atrial pressure of 8 mmHg.   Comparison(s): No prior Echocardiogram.   Patient Profile     72 y.o. male with HTN, NIDDM, dyslipidemia, obesity, OSA-on C-pap, remote stress test/cath in [place] said to be OK in [place], [place] normal 2017 presented with 1 month hx of worsening DOE, PND, BLE edema. Followed by outside cardiologist with recent med adjustment, also seen by pulmonologist and put on nighttime O2. Symptoms progressed prompting admission to the hospital found to have acute hypoxic respiratory failure and possible PNA. EKG also showing progression of LBBB.   Assessment & Plan    1. Acute systolic heart failure exacerbation, LVEF 25 to 30% (new onset) -ReDS 38 today. Continue IV Lasix 80 mg 3 times daily -Continue carvedilol 6.25 mg  twice daily -Continue Entresto 24-26 mg twice daily -Continue spironolactone 25 mg once daily -Continue Farxiga 10 mg once daily -Outpatient CTA cardiac for ischemia valuation -EKG showed QRS 140 ms with nonspecific intraventricular conduction delay. If progresses to LBBB, he will benefit from CRT-D in the future.  2. Acute hypoxic respiratory failure due to CHF +/- PNA - CTA negative for PE, trace L, small R pleural  effusions, atypical patchy opacity RUL (?atx vs pneumonitis, will need f/u CT in 12 weeks per radiology - needs attention to this on follow-up) - Covid/flu neg - primary team treating with antibiotics   3. BLE DVT (femoropopliteal on the left and within the PTV and peroneal veins on the right)   -Continue Eliquis per primary team   4. Essential HTN - manage in context above   5. OSA - per notes, patient was using CPAP religiously every day but his pulmonologist recently switched his CPAP to home oxygen at nighttime.    6. Elevated troponin likely secondary to - low and flat 24-26, suspect demand ischemia but will need ischemic eval at some point given drop in EF - if EF does not improve with medical therapy, consider CRT+/-D - hold off ASA given Eliquis  7.  HLD - LDL 172, trig 206 -> would initiate statin rx when done with colchicine  8. Gout flare - receiving colchicine and steroids per IM -  - would use shortest duration needed given colchicine interaction with carvedilol and potential need for statin, as well as fluid retention with steroids  9. Sinus bradycardia - HR upper 40s during nocturnal periods, some blocked PACs, no hemodynamically significant bradycardia - daytime rates largely OK so continue carvedilol but not titration of dose  I have spent a total of 33 minutes with patient reviewing chart , telemetry, EKGs, labs and examining patient as well as establishing an assessment and plan that was discussed with the patient.  > 50% of time was spent in direct  patient care.    Nori Winegar Fidel Levy, MD [place]  12:27 PM

## 2022-11-13 NOTE — Progress Notes (Signed)
PROGRESS NOTE    Blake Queenan  [id number] DOB: [birth year] DOA: 11/09/2022 PCP: [name], DO   Brief Narrative:    72 y.o. male with past medical history relevant for morbid obesity/OSA, HTN, DM2, HLD, depression GERD and gout admitted on 11/09/2022 with acute hypoxic respiratory failure in the setting of acute CHF exacerbation and possible pneumonia.  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  He continues to diurese aggressively.  Assessment & Plan:   Principal Problem:   Acute exacerbation of CHF (congestive heart failure) (HCC) Active Problems:   Primary hypertension   Uncontrolled type 2 diabetes mellitus with hyperglycemia, without long-term current use of insulin (HCC)   Pneumonitis   Obesity-BMI 41   Sleep apnea-on C-pap   Acute systolic heart failure (HCC)   Acute idiopathic gout of right foot   Acute hypoxic respiratory failure ([place])   Myocardial injury  Assessment and Plan:  1)Acute systolic congestive heart failure -Prior echo from 2017 with preserved EF of 60 to 65% at that time, follows up with Dr. Darral Dash in [place], awaiting records -New echocardiogram results with LVEF 25-30%, appreciate cardiology evaluation to take place 2/19 -Venous Dopplers with new bilateral DVTs and started on Eliquis   2)Possible Community-Acquired Pneumonia--- -CT chest without acute PE but does show pneumonitis --Flu, COVID and RSV negative -No leukocytosis or fevers -Cough has been persistent C/n Rocephin/Azithromycin, mucolytics and bronchodilators as ordered; day 4/5   3)Acute Hypoxic Respiratory Failure--- patient was apparently placed on home O2 but couple of weeks ago at 2 L via nasal cannula -No tobacco abuse -Currently requiring 2 L of oxygen suspect this is secondary to #1 and #2 above -Dyspnea and hypoxia improving with diuresis -Anticipate that we may have able to wean patient off O2  after improvement in #1 and #2 above   4)DM2-hold Farxiga and glimepiride -Use Novolog/Humalog Sliding scale insulin with Accu-Cheks/Fingersticks as ordered    5)HTN-stage II--  BP is improving -c/n Coreg and amlodipine switched to [facility] per Cardiology 2/19 -C/n IV Lasix -May use IV azelastine as needed elevated blood pressure   6)Morbid Obesity/OSA--- PTA patient was on home CPAP --continue same -Low calorie diet, portion control and increase physical activity discussed with patient - 6)GERD--c/n Protonix as ordered   7)Depression and Anxiety--- stable, c/n citalopram and hydroxyzine   8) Bilateral DVTs-noted on ultrasound and started on Eliquis 2/19  9) possible acute gout flare to foot-started on prednisone 2/20 which will be discontinued, he will now be on colchicine which has already been prescribed.  Continues to have pain and cannot weight-bear.  DVT prophylaxis:Eliquis Code Status: Full Family Communication: Wife at bedside 2/21 Disposition Plan:  Status is: Inpatient Remains inpatient appropriate because: Ongoing need for IV medications   Consultants:  Cardiology  Procedures:  None  Antimicrobials:  Anti-infectives (From admission, onward)    Start     Dose/Rate Route Frequency Ordered Stop   11/09/22 1730  cefTRIAXone (ROCEPHIN) 2 g in sodium chloride 0.9 % 100 mL IVPB        2 g 200 mL/hr over 30 Minutes Intravenous Every 24 hours 11/09/22 1633     11/09/22 1730  azithromycin (ZITHROMAX) 500 mg in sodium chloride 0.9 % 250 mL IVPB        500 mg 250 mL/hr over 60 Minutes Intravenous Every 24 hours 11/09/22 1633         Subjective: Patient seen and evaluated today  with no new acute complaints or concerns. No acute concerns or events noted overnight.  He states that he is having improved urination and shortness of breath as well as lower extremity edema have improved.  He denies any chest pain.  He states that his right foot pain is somewhat  improved.  Objective: Vitals:   11/13/22 0500 11/13/22 0519 11/13/22 0718 11/13/22 0934  BP:  125/68  125/66  Pulse:  66  74  Resp:  20  19  Temp:  (!) 97.2 F (36.2 C)  97.8 F (36.6 C)  TempSrc:    Oral  SpO2:  94% 95% 95%  Weight: 111.7 kg     Height:        Intake/Output Summary (Last 24 hours) at 11/13/2022 1120 Last data filed at 11/13/2022 1033 Gross per 24 hour  Intake 723 ml  Output 2950 ml  Net -2227 ml   Filed Weights   11/11/22 0700 11/12/22 1630 11/13/22 0500  Weight: 114.7 kg 111.6 kg 111.7 kg    Examination:  General exam: Appears calm and comfortable  Respiratory system: Clear to auscultation. Respiratory effort normal.  Nasal cannula Cardiovascular system: S1 & S2 heard, RRR.  Gastrointestinal system: Abdomen is soft Central nervous system: Alert and awake Extremities: No edema Skin: No significant lesions noted Psychiatry: Flat affect.    Data Reviewed: I have personally reviewed following labs and imaging studies  CBC: Recent Labs  Lab 11/09/22 1120 11/12/22 0447 11/13/22 0425  WBC 6.6 7.8 11.0*  NEUTROABS 4.8  --   --   HGB 12.7* 13.6 13.4  HCT 42.0 43.7 42.7  MCV 97.0 95.6 94.3  PLT 122* 157 [id number]   Basic Metabolic Panel: Recent Labs  Lab 11/09/22 1120 11/10/22 0516 11/11/22 0541 11/12/22 0447 11/13/22 0425  NA 140 143 142 138 140  K 4.6 3.8 3.7 3.5 3.4*  CL 103 103 100 98 96*  CO2 29 33* 35* 32 34*  GLUCOSE 230* 163* 151* 155* 173*  BUN 17 16 20 20 $ 24*  CREATININE 0.93 0.90 0.99 0.89 1.10  CALCIUM 8.6* 8.5* 8.6* 8.6* 8.7*  MG  --   --   --  1.9  --   PHOS  --   --  4.5  --   --    GFR: Estimated Creatinine Clearance: 73.6 mL/min (by C-G formula based on SCr of 1.1 mg/dL). Liver Function Tests: Recent Labs  Lab 11/09/22 1120 11/10/22 0516 11/11/22 0541 11/13/22 0425  AST 34 22  --  11*  ALT 55* 50*  --  25  ALKPHOS 83 76  --  68  BILITOT 0.6 0.5  --  0.7  PROT 6.2* 6.2*  --  6.7  ALBUMIN 3.1* 3.0* 3.0* 3.1*    No results for input(s): "LIPASE", "AMYLASE" in the last 168 hours. No results for input(s): "AMMONIA" in the last 168 hours. Coagulation Profile: No results for input(s): "INR", "PROTIME" in the last 168 hours. Cardiac Enzymes: No results for input(s): "CKTOTAL", "CKMB", "CKMBINDEX", "TROPONINI" in the last 168 hours. BNP (last 3 results) No results for input(s): "PROBNP" in the last 8760 hours. HbA1C: No results for input(s): "HGBA1C" in the last 72 hours. CBG: Recent Labs  Lab 11/12/22 1126 11/12/22 1620 11/12/22 2057 11/13/22 0741 11/13/22 1110  GLUCAP 237* 277* 246* 149* 316*   Lipid Profile: Recent Labs    11/13/22 0425  CHOL 251*  HDL 38*  LDLCALC 172*  TRIG 206*  CHOLHDL 6.6   Thyroid Function  Tests: Recent Labs    11/13/22 0425  TSH 0.553   Anemia Panel: No results for input(s): "VITAMINB12", "FOLATE", "FERRITIN", "TIBC", "IRON", "RETICCTPCT" in the last 72 hours. Sepsis Labs: No results for input(s): "PROCALCITON", "LATICACIDVEN" in the last 168 hours.  Recent Results (from the past 240 hour(s))  Resp panel by RT-PCR (RSV, Flu A&B, Covid) Anterior Nasal Swab     Status: None   Collection Time: 11/09/22 11:20 AM   Specimen: Anterior Nasal Swab  Result Value Ref Range Status   SARS Coronavirus 2 by RT PCR NEGATIVE NEGATIVE Final    Comment: (NOTE) SARS-CoV-2 target nucleic acids are NOT DETECTED.  The SARS-CoV-2 RNA is generally detectable in upper respiratory specimens during the acute phase of infection. The lowest concentration of SARS-CoV-2 viral copies this assay can detect is 138 copies/mL. A negative result does not preclude SARS-Cov-2 infection and should not be used as the sole basis for treatment or other patient management decisions. A negative result may occur with  improper specimen collection/handling, submission of specimen other than nasopharyngeal swab, presence of viral mutation(s) within the areas targeted by this assay, and  inadequate number of viral copies(<138 copies/mL). A negative result must be combined with clinical observations, patient history, and epidemiological information. The expected result is Negative.  Fact Sheet for Patients:  EntrepreneurPulse.com.au  Fact Sheet for Healthcare Providers:  IncredibleEmployment.be  This test is no t yet approved or cleared by the [place] FDA and  has been authorized for detection and/or diagnosis of SARS-CoV-2 by FDA under an Emergency Use Authorization (EUA). This EUA will remain  in effect (meaning this test can be used) for the duration of the COVID-19 declaration under Section 564(b)(1) of the Act, 21 U.S.C.section 360bbb-3(b)(1), unless the authorization is terminated  or revoked sooner.       Influenza A by PCR NEGATIVE NEGATIVE Final   Influenza B by PCR NEGATIVE NEGATIVE Final    Comment: (NOTE) The Xpert Xpress SARS-CoV-2/FLU/RSV plus assay is intended as an aid in the diagnosis of influenza from Nasopharyngeal swab specimens and should not be used as a sole basis for treatment. Nasal washings and aspirates are unacceptable for Xpert Xpress SARS-CoV-2/FLU/RSV testing.  Fact Sheet for Patients: EntrepreneurPulse.com.au  Fact Sheet for Healthcare Providers: IncredibleEmployment.be  This test is not yet approved or cleared by the [place] FDA and has been authorized for detection and/or diagnosis of SARS-CoV-2 by FDA under an Emergency Use Authorization (EUA). This EUA will remain in effect (meaning this test can be used) for the duration of the COVID-19 declaration under Section 564(b)(1) of the Act, 21 U.S.C. section 360bbb-3(b)(1), unless the authorization is terminated or revoked.     Resp Syncytial Virus by PCR NEGATIVE NEGATIVE Final    Comment: (NOTE) Fact Sheet for Patients: EntrepreneurPulse.com.au  Fact Sheet for Healthcare  Providers: IncredibleEmployment.be  This test is not yet approved or cleared by the [place] FDA and has been authorized for detection and/or diagnosis of SARS-CoV-2 by FDA under an Emergency Use Authorization (EUA). This EUA will remain in effect (meaning this test can be used) for the duration of the COVID-19 declaration under Section 564(b)(1) of the Act, 21 U.S.C. section 360bbb-3(b)(1), unless the authorization is terminated or revoked.  Performed at [facility], [address], [place], [place] [address]          Radiology Studies: No results found.      Scheduled Meds:  apixaban  10 mg Oral BID   Followed by   [  START ON 11/18/2022] apixaban  5 mg Oral BID   carvedilol  6.25 mg Oral BID WC   citalopram  20 mg Oral Daily   colchicine  0.6 mg Oral BID   dapagliflozin propanediol  10 mg Oral Daily   dextromethorphan-guaiFENesin  1 tablet Oral BID   furosemide  80 mg Intravenous TID   insulin aspart  0-5 Units Subcutaneous QHS   insulin aspart  0-9 Units Subcutaneous TID WC   ipratropium-albuterol  3 mL Nebulization BID   potassium chloride  40 mEq Oral Once   sacubitril-valsartan  1 tablet Oral BID   sodium chloride flush  3 mL Intravenous Q12H   sodium chloride flush  3 mL Intravenous Q12H   spironolactone  25 mg Oral Daily   traZODone  100 mg Oral QHS   Continuous Infusions:  sodium chloride     azithromycin 500 mg (11/12/22 1632)   cefTRIAXone (ROCEPHIN)  IV 2 g (11/12/22 1804)     LOS: 4 days    Time spent: 35 minutes    Keygan Dumond Darleen Crocker, DO [facility]  If 7PM-7AM, please contact night-coverage [email or web address] 11/13/2022, 11:20 AM

## 2022-11-14 ENCOUNTER — Other Ambulatory Visit (HOSPITAL_COMMUNITY): Payer: Self-pay

## 2022-11-14 DIAGNOSIS — I509 Heart failure, unspecified: Secondary | ICD-10-CM | POA: Diagnosis not present

## 2022-11-14 LAB — GLUCOSE, CAPILLARY
Glucose-Capillary: 151 mg/dL — ABNORMAL HIGH (ref 70–99)
Glucose-Capillary: 224 mg/dL — ABNORMAL HIGH (ref 70–99)
Glucose-Capillary: 147 mg/dL — ABNORMAL HIGH (ref 70–99)
Glucose-Capillary: 213 mg/dL — ABNORMAL HIGH (ref 70–99)

## 2022-11-14 LAB — CBC
HCT: 43.2 % (ref 39.0–52.0)
Hemoglobin: 13.3 g/dL (ref 13.0–17.0)
MCH: 29.4 pg (ref 26.0–34.0)
MCHC: 30.8 g/dL (ref 30.0–36.0)
MCV: 95.6 fL (ref 80.0–100.0)
Platelets: 176 10*3/uL (ref 150–400)
RBC: 4.52 MIL/uL (ref 4.22–5.81)
RDW: 14 % (ref 11.5–15.5)
WBC: 9.6 10*3/uL (ref 4.0–10.5)
nRBC: 0 % (ref 0.0–0.2)

## 2022-11-14 LAB — COMPREHENSIVE METABOLIC PANEL
ALT: 21 U/L (ref 0–44)
AST: 11 U/L — ABNORMAL LOW (ref 15–41)
Albumin: 2.9 g/dL — ABNORMAL LOW (ref 3.5–5.0)
Alkaline Phosphatase: 63 U/L (ref 38–126)
Anion gap: 8 (ref 5–15)
BUN: 31 mg/dL — ABNORMAL HIGH (ref 8–23)
CO2: 34 mmol/L — ABNORMAL HIGH (ref 22–32)
Calcium: 8.6 mg/dL — ABNORMAL LOW (ref 8.9–10.3)
Chloride: 99 mmol/L (ref 98–111)
Creatinine, Ser: 0.99 mg/dL (ref 0.61–1.24)
GFR, Estimated: >60 mL/min (ref >60)
Glucose, Bld: 160 mg/dL — ABNORMAL HIGH (ref 70–99)
Potassium: 3.4 mmol/L — ABNORMAL LOW (ref 3.5–5.1)
Sodium: 141 mmol/L (ref 135–145)
Total Bilirubin: 0.4 mg/dL (ref 0.3–1.2)
Total Protein: 6.5 g/dL (ref 6.5–8.1)

## 2022-11-14 LAB — MAGNESIUM: Magnesium: 2.2 mg/dL (ref 1.7–2.4)

## 2022-11-14 MED ORDER — FUROSEMIDE 10 MG/ML IJ SOLN
80.0000 mg | Freq: Two times a day (BID) | INTRAMUSCULAR | Status: DC
  Administered 2022-11-14: 80 mg via INTRAVENOUS
  Filled 2022-11-14 (×2): qty 8

## 2022-11-14 MED ORDER — POTASSIUM CHLORIDE CRYS ER 20 MEQ PO TBCR
40.0000 meq | EXTENDED_RELEASE_TABLET | Freq: Two times a day (BID) | ORAL | Status: DC
  Administered 2022-11-14 – 2022-11-15 (×3): 40 meq via ORAL
  Filled 2022-11-14 (×3): qty 2

## 2022-11-14 MED ORDER — IPRATROPIUM-ALBUTEROL 0.5-2.5 (3) MG/3ML IN SOLN: 3.0000 mL | Freq: Four times a day (QID) | RESPIRATORY_TRACT | Status: DC | PRN

## 2022-11-14 NOTE — Inpatient Diabetes Management (Signed)
Inpatient Diabetes Program Recommendations  AACE/ADA: New Consensus Statement on Inpatient Glycemic Control   Target Ranges:  Prepandial:   less than 140 mg/dL      Peak postprandial:   less than 180 mg/dL (1-2 hours)      Critically ill patients:  140 - 180 mg/dL    Latest Reference Range & Units 11/13/22 07:41 11/13/22 11:10 11/13/22 16:52 11/13/22 21:48 11/14/22 07:19 11/14/22 11:57  Glucose-Capillary 70 - 99 mg/dL 149 (H) 316 (H) 262 (H) 188 (H) 151 (H) 224 (H)   Review of Glycemic Control  Diabetes history: DM2 Outpatient Diabetes medications: Farxiga 10 daily, Amaryl 2 mg QAM Current orders for Inpatient glycemic control: Novolog 0-9 units TID with meals, Novolog 0-5 units QHS, Farxiga 10 mg daily  Inpatient Diabetes Program Recommendations:    Insulin: Please consider ordering Novolog 2 units TID with meals for meal coverage if patient eats at least 50% of meals.  Thanks, Barnie Alderman, RN, MSN, [place] Diabetes Coordinator Inpatient Diabetes Program [phone] (Team Pager from 8am to [place])

## 2022-11-14 NOTE — Progress Notes (Signed)
PROGRESS NOTE    Noah Hampton  [id number] DOB: [birth year] DOA: 11/09/2022 PCP: [name], DO   Brief Narrative:    72 y.o. male with past medical history relevant for morbid obesity/OSA, HTN, DM2, HLD, depression GERD and gout admitted on 11/09/2022 with acute hypoxic respiratory failure in the setting of acute CHF exacerbation and possible pneumonia.  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  He continues to diurese aggressively.  Assessment & Plan:   Principal Problem:   Acute exacerbation of CHF (congestive heart failure) (HCC) Active Problems:   Primary hypertension   Uncontrolled type 2 diabetes mellitus with hyperglycemia, without long-term current use of insulin (HCC)   Pneumonitis   Obesity-BMI 41   OSA (obstructive sleep apnea)   Acute systolic heart failure (HCC)   Acute idiopathic gout of right foot   Acute hypoxic respiratory failure ([place])   Myocardial injury   Other hyperlipidemia   Acute deep vein thrombosis (DVT) of proximal vein of both lower extremities (HCC)  Assessment and Plan:  1)Acute systolic congestive heart failure -Prior echo from 2017 with preserved EF of 60 to 65% at that time, follows up with Dr. Darral Dash in [place], awaiting records -New echocardiogram results with LVEF 25-30%, appreciate cardiology evaluation to take place 2/19 -Venous Dopplers with new bilateral DVTs and started on Eliquis -IV Lasix adjusted per cardiology to twice daily today and anticipate transition to p.o. tomorrow   2)Possible Community-Acquired Pneumonia--- -CT chest without acute PE but does show pneumonitis --Flu, COVID and RSV negative -No leukocytosis or fevers -Cough has been persistent C/n Rocephin/Azithromycin, mucolytics and bronchodilators as ordered; day 5/5   3)Acute Hypoxic Respiratory Failure--- patient was apparently placed on home O2 but couple of weeks ago at 2 L via  nasal cannula -No tobacco abuse -Currently requiring 2 L of oxygen suspect this is secondary to #1 and #2 above -Dyspnea and hypoxia improving with diuresis -Anticipate that we may have able to wean patient off O2 after improvement in #1 and #2 above   4)DM2-hold Farxiga and glimepiride -Use Novolog/Humalog Sliding scale insulin with Accu-Cheks/Fingersticks as ordered    5)HTN-stage II--  BP is improving -c/n Coreg and amlodipine switched to [facility] per Cardiology 2/19 -C/n IV Lasix -May use IV azelastine as needed elevated blood pressure   6)Morbid Obesity/OSA--- PTA patient was on home CPAP --continue same -Low calorie diet, portion control and increase physical activity discussed with patient - 6)GERD--c/n Protonix as ordered   7)Depression and Anxiety--- stable, c/n citalopram and hydroxyzine   8) Bilateral DVTs-noted on ultrasound and started on Eliquis 2/19  9) possible acute gout flare to foot-started on prednisone 2/20 which will be discontinued, he will now be on colchicine which has already been prescribed.  Foot pain is improved today.  DVT prophylaxis:Eliquis Code Status: Full Family Communication: Wife at bedside 2/21 Disposition Plan:  Status is: Inpatient Remains inpatient appropriate because: Ongoing need for IV medications   Consultants:  Cardiology  Procedures:  None  Antimicrobials:  Anti-infectives (From admission, onward)    Start     Dose/Rate Route Frequency Ordered Stop   11/09/22 1730  cefTRIAXone (ROCEPHIN) 2 g in sodium chloride 0.9 % 100 mL IVPB        2 g 200 mL/hr over 30 Minutes Intravenous Every 24 hours 11/09/22 1633     11/09/22 1730  azithromycin (ZITHROMAX) 500 mg in sodium chloride 0.9 % 250 mL IVPB  500 mg 250 mL/hr over 60 Minutes Intravenous Every 24 hours 11/09/22 1633         Subjective: Patient seen and evaluated today with no new acute complaints or concerns. No acute concerns or events noted overnight.  He  states that his foot pain is improved and he continues to diurese aggressively.  Objective: Vitals:   11/13/22 2307 11/14/22 0458 11/14/22 0736 11/14/22 0750  BP:  129/71  (!) 140/74  Pulse: 69 63 61 62  Resp: 16 (!) 24 16 20  $ Temp:  (!) 97.5 F (36.4 C)  97.8 F (36.6 C)  TempSrc:  Oral  Oral  SpO2: 95% 92% 96% 96%  Weight:  110.7 kg    Height:        Intake/Output Summary (Last 24 hours) at 11/14/2022 1325 Last data filed at 11/14/2022 0900 Gross per 24 hour  Intake 1449 ml  Output 2775 ml  Net -1326 ml   Filed Weights   11/12/22 1630 11/13/22 0500 11/14/22 0458  Weight: 111.6 kg 111.7 kg 110.7 kg    Examination:  General exam: Appears calm and comfortable  Respiratory system: Clear to auscultation. Respiratory effort normal.  Nasal cannula Cardiovascular system: S1 & S2 heard, RRR.  Gastrointestinal system: Abdomen is soft Central nervous system: Alert and awake Extremities: No edema Skin: No significant lesions noted Psychiatry: Flat affect.    Data Reviewed: I have personally reviewed following labs and imaging studies  CBC: Recent Labs  Lab 11/09/22 1120 11/12/22 0447 11/13/22 0425 11/14/22 0437  WBC 6.6 7.8 11.0* 9.6  NEUTROABS 4.8  --   --   --   HGB 12.7* 13.6 13.4 13.3  HCT 42.0 43.7 42.7 43.2  MCV 97.0 95.6 94.3 95.6  PLT 122* 157 168 0000000   Basic Metabolic Panel: Recent Labs  Lab 11/10/22 0516 11/11/22 0541 11/12/22 0447 11/13/22 0425 11/14/22 0437  NA 143 142 138 140 141  K 3.8 3.7 3.5 3.4* 3.4*  CL 103 100 98 96* 99  CO2 33* 35* 32 34* 34*  GLUCOSE 163* 151* 155* 173* 160*  BUN 16 20 20 $ 24* 31*  CREATININE 0.90 0.99 0.89 1.10 0.99  CALCIUM 8.5* 8.6* 8.6* 8.7* 8.6*  MG  --   --  1.9  --  2.2  PHOS  --  4.5  --   --   --    GFR: Estimated Creatinine Clearance: 81.4 mL/min (by C-G formula based on SCr of 0.99 mg/dL). Liver Function Tests: Recent Labs  Lab 11/09/22 1120 11/10/22 0516 11/11/22 0541 11/13/22 0425 11/14/22 0437   AST 34 22  --  11* 11*  ALT 55* 50*  --  25 21  ALKPHOS 83 76  --  68 63  BILITOT 0.6 0.5  --  0.7 0.4  PROT 6.2* 6.2*  --  6.7 6.5  ALBUMIN 3.1* 3.0* 3.0* 3.1* 2.9*   No results for input(s): "LIPASE", "AMYLASE" in the last 168 hours. No results for input(s): "AMMONIA" in the last 168 hours. Coagulation Profile: No results for input(s): "INR", "PROTIME" in the last 168 hours. Cardiac Enzymes: No results for input(s): "CKTOTAL", "CKMB", "CKMBINDEX", "TROPONINI" in the last 168 hours. BNP (last 3 results) No results for input(s): "PROBNP" in the last 8760 hours. HbA1C: No results for input(s): "HGBA1C" in the last 72 hours. CBG: Recent Labs  Lab 11/13/22 1110 11/13/22 1652 11/13/22 2148 11/14/22 0719 11/14/22 1157  GLUCAP 316* 262* 188* 151* 224*   Lipid Profile: Recent Labs  11/13/22 0425  CHOL 251*  HDL 38*  LDLCALC 172*  TRIG 206*  CHOLHDL 6.6   Thyroid Function Tests: Recent Labs    11/13/22 0425  TSH 0.553   Anemia Panel: No results for input(s): "VITAMINB12", "FOLATE", "FERRITIN", "TIBC", "IRON", "RETICCTPCT" in the last 72 hours. Sepsis Labs: No results for input(s): "PROCALCITON", "LATICACIDVEN" in the last 168 hours.  Recent Results (from the past 240 hour(s))  Resp panel by RT-PCR (RSV, Flu A&B, Covid) Anterior Nasal Swab     Status: None   Collection Time: 11/09/22 11:20 AM   Specimen: Anterior Nasal Swab  Result Value Ref Range Status   SARS Coronavirus 2 by RT PCR NEGATIVE NEGATIVE Final    Comment: (NOTE) SARS-CoV-2 target nucleic acids are NOT DETECTED.  The SARS-CoV-2 RNA is generally detectable in upper respiratory specimens during the acute phase of infection. The lowest concentration of SARS-CoV-2 viral copies this assay can detect is 138 copies/mL. A negative result does not preclude SARS-Cov-2 infection and should not be used as the sole basis for treatment or other patient management decisions. A negative result may occur with   improper specimen collection/handling, submission of specimen other than nasopharyngeal swab, presence of viral mutation(s) within the areas targeted by this assay, and inadequate number of viral copies(<138 copies/mL). A negative result must be combined with clinical observations, patient history, and epidemiological information. The expected result is Negative.  Fact Sheet for Patients:  EntrepreneurPulse.com.au  Fact Sheet for Healthcare Providers:  IncredibleEmployment.be  This test is no t yet approved or cleared by the [place] FDA and  has been authorized for detection and/or diagnosis of SARS-CoV-2 by FDA under an Emergency Use Authorization (EUA). This EUA will remain  in effect (meaning this test can be used) for the duration of the COVID-19 declaration under Section 564(b)(1) of the Act, 21 U.S.C.section 360bbb-3(b)(1), unless the authorization is terminated  or revoked sooner.       Influenza A by PCR NEGATIVE NEGATIVE Final   Influenza B by PCR NEGATIVE NEGATIVE Final    Comment: (NOTE) The Xpert Xpress SARS-CoV-2/FLU/RSV plus assay is intended as an aid in the diagnosis of influenza from Nasopharyngeal swab specimens and should not be used as a sole basis for treatment. Nasal washings and aspirates are unacceptable for Xpert Xpress SARS-CoV-2/FLU/RSV testing.  Fact Sheet for Patients: EntrepreneurPulse.com.au  Fact Sheet for Healthcare Providers: IncredibleEmployment.be  This test is not yet approved or cleared by the [place] FDA and has been authorized for detection and/or diagnosis of SARS-CoV-2 by FDA under an Emergency Use Authorization (EUA). This EUA will remain in effect (meaning this test can be used) for the duration of the COVID-19 declaration under Section 564(b)(1) of the Act, 21 U.S.C. section 360bbb-3(b)(1), unless the authorization is terminated or revoked.      Resp Syncytial Virus by PCR NEGATIVE NEGATIVE Final    Comment: (NOTE) Fact Sheet for Patients: EntrepreneurPulse.com.au  Fact Sheet for Healthcare Providers: IncredibleEmployment.be  This test is not yet approved or cleared by the [place] FDA and has been authorized for detection and/or diagnosis of SARS-CoV-2 by FDA under an Emergency Use Authorization (EUA). This EUA will remain in effect (meaning this test can be used) for the duration of the COVID-19 declaration under Section 564(b)(1) of the Act, 21 U.S.C. section 360bbb-3(b)(1), unless the authorization is terminated or revoked.  Performed at [facility], [address], [place], [place] [address]          Radiology Studies:  No results found.      Scheduled Meds:  apixaban  10 mg Oral BID   Followed by   Derrill Memo ON 11/18/2022] apixaban  5 mg Oral BID   carvedilol  6.25 mg Oral BID WC   citalopram  20 mg Oral Daily   colchicine  0.6 mg Oral BID   dapagliflozin propanediol  10 mg Oral Daily   dextromethorphan-guaiFENesin  1 tablet Oral BID   furosemide  80 mg Intravenous BID   insulin aspart  0-5 Units Subcutaneous QHS   insulin aspart  0-9 Units Subcutaneous TID WC   ipratropium-albuterol  3 mL Nebulization BID   potassium chloride  40 mEq Oral BID   sacubitril-valsartan  1 tablet Oral BID   sodium chloride flush  3 mL Intravenous Q12H   sodium chloride flush  3 mL Intravenous Q12H   spironolactone  25 mg Oral Daily   traZODone  100 mg Oral QHS   Continuous Infusions:  sodium chloride     azithromycin 500 mg (11/13/22 1820)   cefTRIAXone (ROCEPHIN)  IV 2 g (11/13/22 1723)     LOS: 5 days    Time spent: 35 minutes    Rolin Schult Darleen Crocker, DO [facility]  If 7PM-7AM, please contact night-coverage [email or web address] 11/14/2022, 1:25 PM

## 2022-11-14 NOTE — TOC Benefit Eligibility Note (Signed)
Patient Teacher, English as a foreign language completed.    The patient is currently admitted and upon discharge could be taking Vascepa 1 g Capsules.  Prior Authorization Required  The patient is insured through [employer], [place] Patient Advocate Specialist [place] Patient Advocate Team Direct Number: [phone]  Fax: [phone]

## 2022-11-14 NOTE — Progress Notes (Signed)
Patient rested well during this shift. CPAP placed by respiratory.

## 2022-11-14 NOTE — Progress Notes (Addendum)
Rounding Note    Patient Name: Noah Hampton Date of Encounter: 11/14/2022  [place] Cardiologist: none here, KOTLABA,DAVID, MD ([facility] in [place])  Subjective   No chest pain, breathing better, and edema improved. He could walk to BR today without pain from his gout.   Inpatient Medications    Scheduled Meds:  apixaban  10 mg Oral BID   Followed by   Derrill Memo ON 11/18/2022] apixaban  5 mg Oral BID   carvedilol  6.25 mg Oral BID WC   citalopram  20 mg Oral Daily   colchicine  0.6 mg Oral BID   dapagliflozin propanediol  10 mg Oral Daily   dextromethorphan-guaiFENesin  1 tablet Oral BID   furosemide  80 mg Intravenous TID   insulin aspart  0-5 Units Subcutaneous QHS   insulin aspart  0-9 Units Subcutaneous TID WC   ipratropium-albuterol  3 mL Nebulization BID   potassium chloride  40 mEq Oral BID   sacubitril-valsartan  1 tablet Oral BID   sodium chloride flush  3 mL Intravenous Q12H   sodium chloride flush  3 mL Intravenous Q12H   spironolactone  25 mg Oral Daily   traZODone  100 mg Oral QHS   Continuous Infusions:  sodium chloride     azithromycin 500 mg (11/13/22 1820)   cefTRIAXone (ROCEPHIN)  IV 2 g (11/13/22 1723)   PRN Meds: sodium chloride, acetaminophen **OR** acetaminophen, albuterol, bisacodyl, hydrALAZINE, HYDROmorphone (DILAUDID) injection, ondansetron **OR** ondansetron (ZOFRAN) IV, polyethylene glycol, sodium chloride flush   Vital Signs    Vitals:   11/13/22 2307 11/14/22 0458 11/14/22 0736 11/14/22 0750  BP:  129/71  (!) 140/74  Pulse: 69 63 61 62  Resp: 16 (!) 24 16 20  $ Temp:  (!) 97.5 F (36.4 C)  97.8 F (36.6 C)  TempSrc:  Oral  Oral  SpO2: 95% 92% 96% 96%  Weight:  110.7 kg    Height:        Intake/Output Summary (Last 24 hours) at 11/14/2022 1121 Last data filed at 11/14/2022 0900 Gross per 24 hour  Intake 1449 ml  Output 2775 ml  Net -1326 ml      11/14/2022    4:58 AM 11/13/2022    5:00 AM 11/12/2022    4:30 PM  Last 3  Weights  Weight (lbs) 244 lb 0.8 oz 246 lb 4.1 oz 246 lb  Weight (kg) 110.7 kg 111.7 kg 111.585 kg      Telemetry    SR to SB 58 last pm - Personally Reviewed  ECG    No new - Personally Reviewed  Physical Exam    GEN: No acute distress.   Neck: No JVD Cardiac: RRR, no murmurs, rubs, or gallops.  Respiratory: Clear to some diminished auscultation bilaterally. GI: Soft, nontender, non-distended  MS: No edema; No deformity. Neuro:  Nonfocal  Psych: Normal affect   Labs    High Sensitivity Troponin:   Recent Labs  Lab 11/09/22 1120 11/09/22 1323  TROPONINIHS 24* 26*     Chemistry Recent Labs  Lab 11/10/22 0516 11/11/22 0541 11/12/22 0447 11/13/22 0425 11/14/22 0437  NA 143 142 138 140 141  K 3.8 3.7 3.5 3.4* 3.4*  CL 103 100 98 96* 99  CO2 33* 35* 32 34* 34*  GLUCOSE 163* 151* 155* 173* 160*  BUN 16 20 20 $ 24* 31*  CREATININE 0.90 0.99 0.89 1.10 0.99  CALCIUM 8.5* 8.6* 8.6* 8.7* 8.6*  MG  --   --  1.9  --  2.2  PROT 6.2*  --   --  6.7 6.5  ALBUMIN 3.0* 3.0*  --  3.1* 2.9*  AST 22  --   --  11* 11*  ALT 50*  --   --  25 21  ALKPHOS 76  --   --  68 63  BILITOT 0.5  --   --  0.7 0.4  GFRNONAA >60 >60 >60 >60 >60  ANIONGAP 7 7 8 10 8    $ Lipids  Recent Labs  Lab 11/13/22 0425  CHOL 251*  TRIG 206*  HDL 38*  LDLCALC 172*  CHOLHDL 6.6    Hematology Recent Labs  Lab 11/12/22 0447 11/13/22 0425 11/14/22 0437  WBC 7.8 11.0* 9.6  RBC 4.57 4.53 4.52  HGB 13.6 13.4 13.3  HCT 43.7 42.7 43.2  MCV 95.6 94.3 95.6  MCH 29.8 29.6 29.4  MCHC 31.1 31.4 30.8  RDW 13.7 13.9 14.0  PLT 157 168 176   Thyroid  Recent Labs  Lab 11/13/22 0425  TSH 0.553    BNP Recent Labs  Lab 11/09/22 1120  BNP 436.0*    DDimer No results for input(s): "DDIMER" in the last 168 hours.   Radiology    No results found.  Cardiac Studies   2d echo 10/2022     1. Left ventricular ejection fraction, by estimation, is 25 to 30%. The  left ventricle has severely  decreased function. The left ventricle  demonstrates global hypokinesis. There is mild left ventricular  hypertrophy. Left ventricular diastolic parameters   are consistent with Grade I diastolic dysfunction (impaired relaxation).   2. Right ventricular systolic function is low normal. The right  ventricular size is normal. Tricuspid regurgitation signal is inadequate  for assessing PA pressure.   3. Left atrial size was moderately dilated.   4. The mitral valve is normal in structure. No evidence of mitral valve  regurgitation. No evidence of mitral stenosis.   5. The aortic valve is tricuspid. There is moderate calcification of the  aortic valve. Aortic valve regurgitation is not visualized. No aortic  stenosis is present.   6. Aortic dilatation noted. There is mild dilatation of the aortic root,  measuring 40 mm.   7. The inferior vena cava is dilated in size with >50% respiratory  variability, suggesting right atrial pressure of 8 mmHg.   Comparison(s): No prior Echocardiogram.     Patient Profile     72 y.o. male with HTN, NIDDM, dyslipidemia, obesity, OSA-on C-pap, remote stress test/cath in [place] said to be OK in [place], [place] normal 2017 presented with 1 month hx of worsening DOE, PND, BLE edema. Followed by outside cardiologist with recent med adjustment, also seen by pulmonologist and put on nighttime O2. Symptoms progressed prompting admission to the hospital found to have acute hypoxic respiratory failure and possible PNA. EKG also showing progression of LBBB.    Assessment & Plan   1. Acute systolic heart failure exacerbation, LVEF 25 to 30% (new onset) -Switch IV Lasix 80 mg from TID to BID today and switch to p.o. Lasix 80 mg once a day from tomorrow. -Continue carvedilol 6.25 mg twice daily -Continue Entresto 24-26 mg twice daily -Continue spironolactone 25 mg once daily -Continue Farxiga 10 mg once daily -Outpatient CT cardiac for ischemia evaluation -EKG showed  QRS 140 ms with nonspecific intraventricular conduction delay. If progresses to LBBB, he will benefit from CRT-D in the future.   2. Acute hypoxic respiratory failure due  to CHF +/- PNA - CTA negative for PE, trace L, small R pleural effusions, atypical patchy opacity RUL (?atx vs pneumonitis, will need f/u CT in 12 weeks per radiology - needs attention to this on follow-up) - Covid/flu neg - primary team treating with antibiotics   3. BLE DVT (femoropopliteal on the left and within the PTV and peroneal veins on the right)   -Continue Eliquis per primary team   4. Essential HTN -Manage as above   5. OSA - per notes, patient was using CPAP religiously every day but his pulmonologist recently switched his CPAP to home oxygen at nighttime. has been using CPAP here   6.  Acute myocardial injury with no evidence of myocardial infarction likely secondary to volume overload, pneumonia - low and flat 24-26, suspect demand ischemia but will need ischemic eval at some point given drop in EF - if EF does not improve with medical therapy, consider CRT+/-D - hold off ASA given Eliquis   7.  HLD - LDL 172, trig 206 -> would initiate statin rx when done with colchicine   8. Gout flare - receiving colchicine for gout management -Steroids held on 11/13/2022 due to concurrent heart failure   I have spent a total of 33 minutes with patient reviewing chart, EKGs, labs and examining patient as well as establishing an assessment and plan that was discussed with the patient.  > 50% of time was spent in direct patient care.     Dj Senteno Fidel Levy, MD [place]  1:04 PM

## 2022-11-15 ENCOUNTER — Other Ambulatory Visit: Payer: Self-pay | Admitting: Cardiology

## 2022-11-15 ENCOUNTER — Other Ambulatory Visit (HOSPITAL_COMMUNITY): Payer: Self-pay

## 2022-11-15 DIAGNOSIS — I5023 Acute on chronic systolic (congestive) heart failure: Secondary | ICD-10-CM | POA: Diagnosis not present

## 2022-11-15 DIAGNOSIS — R079 Chest pain, unspecified: Secondary | ICD-10-CM

## 2022-11-15 DIAGNOSIS — J189 Pneumonia, unspecified organism: Secondary | ICD-10-CM

## 2022-11-15 LAB — COMPREHENSIVE METABOLIC PANEL
ALT: 21 U/L (ref 0–44)
AST: 13 U/L — ABNORMAL LOW (ref 15–41)
Albumin: 3.1 g/dL — ABNORMAL LOW (ref 3.5–5.0)
Alkaline Phosphatase: 69 U/L (ref 38–126)
Anion gap: 10 (ref 5–15)
BUN: 32 mg/dL — ABNORMAL HIGH (ref 8–23)
CO2: 34 mmol/L — ABNORMAL HIGH (ref 22–32)
Calcium: 9.1 mg/dL (ref 8.9–10.3)
Chloride: 95 mmol/L — ABNORMAL LOW (ref 98–111)
Creatinine, Ser: 1.09 mg/dL (ref 0.61–1.24)
GFR, Estimated: >60 mL/min (ref >60)
Glucose, Bld: 160 mg/dL — ABNORMAL HIGH (ref 70–99)
Potassium: 4 mmol/L (ref 3.5–5.1)
Sodium: 139 mmol/L (ref 135–145)
Total Bilirubin: 0.6 mg/dL (ref 0.3–1.2)
Total Protein: 6.8 g/dL (ref 6.5–8.1)

## 2022-11-15 LAB — MAGNESIUM: Magnesium: 2.3 mg/dL (ref 1.7–2.4)

## 2022-11-15 LAB — GLUCOSE, CAPILLARY
Glucose-Capillary: 170 mg/dL — ABNORMAL HIGH (ref 70–99)
Glucose-Capillary: 214 mg/dL — ABNORMAL HIGH (ref 70–99)

## 2022-11-15 MED ORDER — APIXABAN 5 MG PO TABS: 5.0000 mg | ORAL_TABLET | Freq: Two times a day (BID) | ORAL | 0 refills | Status: DC

## 2022-11-15 MED ORDER — SPIRONOLACTONE 25 MG PO TABS: 25.0000 mg | ORAL_TABLET | Freq: Every day | ORAL | 1 refills | Status: DC

## 2022-11-15 MED ORDER — FUROSEMIDE 40 MG PO TABS
80.0000 mg | ORAL_TABLET | Freq: Every day | ORAL | Status: DC
  Administered 2022-11-15: 80 mg via ORAL
  Filled 2022-11-15: qty 2

## 2022-11-15 MED ORDER — CARVEDILOL 6.25 MG PO TABS: 6.2500 mg | ORAL_TABLET | Freq: Two times a day (BID) | ORAL | 1 refills | Status: AC

## 2022-11-15 MED ORDER — FUROSEMIDE 80 MG PO TABS: 80.0000 mg | ORAL_TABLET | Freq: Every day | ORAL | 0 refills | Status: DC

## 2022-11-15 MED ORDER — SACUBITRIL-VALSARTAN 24-26 MG PO TABS: 1.0000 | ORAL_TABLET | Freq: Two times a day (BID) | ORAL | 0 refills | Status: AC

## 2022-11-15 MED ORDER — APIXABAN 5 MG PO TABS: 10.0000 mg | ORAL_TABLET | Freq: Two times a day (BID) | ORAL | 0 refills | Status: DC

## 2022-11-15 NOTE — Plan of Care (Signed)

## 2022-11-15 NOTE — Discharge Summary (Signed)
Physician Discharge Summary  Noah Hampton [id number] DOB: June 09, 1951 DOA: 11/09/2022  PCP: [name], DO  Admit date: 11/09/2022  Discharge date: 11/15/2022  Admitted From:Home  Disposition:  Home  Recommendations for Outpatient Follow-up:  Follow up with PCP in 1-2 weeks Follow-up with cardiology clinic locally which will be scheduled and outpatient CTA which will also be scheduled Continue on medications as noted below and as adjusted per cardiology  Home Health: None  Equipment/Devices: None  Discharge Condition:Stable  CODE STATUS: Full  Diet recommendation: Heart Healthy/carb modified  Brief/Interim Summary:  72 y.o. male with past medical history relevant for morbid obesity/OSA, HTN, DM2, HLD, depression GERD and gout admitted on 11/09/2022 with acute hypoxic respiratory failure in the setting of acute CHF exacerbation and possible pneumonia.  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  He has completed 5-day course of azithromycin and Rocephin for community-acquired pneumonia and has no further antibiotic needs.  Continue on medications such as Lasix, carvedilol, and Entresto per cardiology.  He was also noted to have new bilateral DVTs and was started on Eliquis for anticoagulation.  He remained hospitalized for ongoing IV diuresis and is now euvolemic and may transition to oral diuretics.  He was also noted to have a right foot acute gout flare that required the use of colchicine and is now able to weight-bear and ambulate without any difficulty or need for oxygen.  No other acute events noted and cardiology will follow-up outpatient.  Discharge Diagnoses:  Principal Problem:   Acute exacerbation of CHF (congestive heart failure) (HCC) Active Problems:   Primary hypertension   Uncontrolled type  2 diabetes mellitus with hyperglycemia, without long-term current use of insulin (HCC)   Pneumonitis   Obesity-BMI 41   OSA (obstructive sleep apnea)   Acute systolic heart failure (HCC)   Acute idiopathic gout of right foot   Acute hypoxic respiratory failure (HCC)   Myocardial injury   Other hyperlipidemia   Acute deep vein thrombosis (DVT) of proximal vein of both lower extremities ([place])  Principal discharge diagnosis: Acute hypoxemic respiratory failure-multifactorial with acute systolic CHF exacerbation with new cardiomyopathy and community-acquired pneumonia.  Bilateral lower extremity DVTs.  Acute gout flare to right foot.  Discharge Instructions  Discharge Instructions     Diet - low sodium heart healthy   Complete by: As directed    Increase activity slowly   Complete by: As directed       Allergies as of 11/15/2022       Reactions   Statins Other (See Comments)    Myalgias (Muscle Pain)   Alpha-gal Other (See Comments)   Unknown Questionable history of alpha-gal per patient, has not had any issues with medications in the past as of 11/11/22   Other Other (See Comments)   Beef/ Unknown        Medication List     STOP taking these medications    amLODipine 5 MG tablet Commonly known as: NORVASC       TAKE these medications    apixaban 5 MG Tabs tablet Commonly known as: ELIQUIS Take 2 tablets (10 mg total) by mouth 2 (two) times daily for 2 days.   apixaban 5 MG Tabs tablet Commonly known as: ELIQUIS Take 1 tablet (5 mg total) by mouth 2 (two) times daily. Start taking on: November 18, 2022  carvedilol 6.25 MG tablet Commonly known as: COREG Take 1 tablet (6.25 mg total) by mouth 2 (two) times daily with a meal. What changed:  medication strength how much to take when to take this   Centrum Adults Tabs Take 1 tablet by mouth daily.   colchicine 0.6 MG tablet Take 0.6 mg by mouth daily.   escitalopram 10 MG tablet Commonly known as:  LEXAPRO Take 10 mg by mouth daily.   Farxiga 10 MG Tabs tablet Generic drug: dapagliflozin propanediol Take 10 mg by mouth daily.   furosemide 80 MG tablet Commonly known as: LASIX Take 1 tablet (80 mg total) by mouth daily. Start taking on: November 16, 2022 What changed:  medication strength how much to take   glimepiride 2 MG tablet Commonly known as: AMARYL Take 2 mg by mouth every morning.   hydrOXYzine 25 MG tablet Commonly known as: ATARAX Take 1 tablet by mouth at bedtime.   NON FORMULARY Pt uses a cpap nightly   polyethylene glycol 17 g packet Commonly known as: MIRALAX / GLYCOLAX Take 17 g by mouth daily.   potassium chloride 20 MEQ packet Commonly known as: KLOR-CON Take 20 mEq by mouth daily.   Rexulti 1 MG Tabs tablet Generic drug: brexpiprazole Take 1 mg by mouth daily.   sacubitril-valsartan 24-26 MG Commonly known as: ENTRESTO Take 1 tablet by mouth 2 (two) times daily.   spironolactone 25 MG tablet Commonly known as: ALDACTONE Take 1 tablet (25 mg total) by mouth daily. Start taking on: November 16, 2022        Follow-up Information     [name], DO. Schedule an appointment as soon as possible for a visit in 1 week(s).   Specialty: Family Medicine Contact information: [address] [place] [address] [phone]                Allergies  Allergen Reactions   Statins Other (See Comments)     Myalgias (Muscle Pain)   Alpha-Gal Other (See Comments)    Unknown Questionable history of alpha-gal per patient, has not had any issues with medications in the past as of 11/11/22   Other Other (See Comments)    Beef/ Unknown    Consultations: Cardiology   Procedures/Studies: ECHOCARDIOGRAM COMPLETE  Result Date: 11/11/2022    ECHOCARDIOGRAM REPORT   Patient Name:   Noah Hampton Date of Exam: 11/10/2022 Medical Rec #:  [id number]   Height:       68.0 in Accession #:    [id number]  Weight:       256.6 lb Date of  Birth:  03/03/1951  BSA:          2.272 m Patient Age:    1 years    BP:           159/82 mmHg Patient Gender: M           HR:           85 bpm. Exam Location:  [facility] Procedure: 2D Echo, Cardiac Doppler and Color Doppler Indications:    R06.00 Dyspnea  History:        Patient has prior history of Echocardiogram examinations, most                 recent 02/02/2016. CHF; Risk Factors:Hypertension, Diabetes and                 Dyslipidemia. Obesity-BMI 41, Sleep apnea-on C-pap.  Sonographer:    Alvino Chapel  RCS Referring Phys: [id number] COURAGE EMOKPAE IMPRESSIONS  1. Left ventricular ejection fraction, by estimation, is 25 to 30%. The left ventricle has severely decreased function. The left ventricle demonstrates global hypokinesis. There is mild left ventricular hypertrophy. Left ventricular diastolic parameters  are consistent with Grade I diastolic dysfunction (impaired relaxation).  2. Right ventricular systolic function is low normal. The right ventricular size is normal. Tricuspid regurgitation signal is inadequate for assessing PA pressure.  3. Left atrial size was moderately dilated.  4. The mitral valve is normal in structure. No evidence of mitral valve regurgitation. No evidence of mitral stenosis.  5. The aortic valve is tricuspid. There is moderate calcification of the aortic valve. Aortic valve regurgitation is not visualized. No aortic stenosis is present.  6. Aortic dilatation noted. There is mild dilatation of the aortic root, measuring 40 mm.  7. The inferior vena cava is dilated in size with >50% respiratory variability, suggesting right atrial pressure of 8 mmHg. Comparison(s): No prior Echocardiogram. FINDINGS  Left Ventricle: Left ventricular ejection fraction, by estimation, is 25 to 30%. The left ventricle has severely decreased function. The left ventricle demonstrates global hypokinesis. Definity contrast agent was given IV to delineate the left ventricular endocardial borders. The left  ventricular internal cavity size was normal in size. There is mild left ventricular hypertrophy. Abnormal (paradoxical) septal motion, consistent with left bundle branch block. Left ventricular diastolic parameters are consistent with Grade I diastolic dysfunction (impaired relaxation). Right Ventricle: The right ventricular size is normal. No increase in right ventricular wall thickness. Right ventricular systolic function is low normal. Tricuspid regurgitation signal is inadequate for assessing PA pressure. Left Atrium: Left atrial size was moderately dilated. Right Atrium: Right atrial size was normal in size. Pericardium: There is no evidence of pericardial effusion. Mitral Valve: The mitral valve is normal in structure. No evidence of mitral valve regurgitation. No evidence of mitral valve stenosis. Tricuspid Valve: The tricuspid valve is not well visualized. Tricuspid valve regurgitation is not demonstrated. No evidence of tricuspid stenosis. Aortic Valve: The aortic valve is tricuspid. There is moderate calcification of the aortic valve. Aortic valve regurgitation is not visualized. No aortic stenosis is present. Pulmonic Valve: The pulmonic valve was not well visualized. Pulmonic valve regurgitation is trivial. No evidence of pulmonic stenosis. Aorta: Aortic dilatation noted. There is mild dilatation of the aortic root, measuring 40 mm. Venous: The inferior vena cava is dilated in size with greater than 50% respiratory variability, suggesting right atrial pressure of 8 mmHg. IAS/Shunts: No atrial level shunt detected by color flow Doppler.  LEFT VENTRICLE PLAX 2D LVIDd:         5.20 cm   Diastology LVIDs:         4.30 cm   LV e' medial:    4.90 cm/s LV PW:         1.30 cm   LV E/e' medial:  17.0 LV IVS:        1.30 cm   LV e' lateral:   7.18 cm/s LVOT diam:     2.10 cm   LV E/e' lateral: 11.6 LV SV:         59 LV SV Index:   26 LVOT Area:     3.46 cm  RIGHT VENTRICLE RV S prime:     11.10 cm/s TAPSE  (M-mode): 2.4 cm LEFT ATRIUM             Index        RIGHT ATRIUM  Index LA diam:        4.20 cm 1.85 cm/m   RA Area:     17.20 cm LA Vol (A2C):   69.1 ml 30.41 ml/m  RA Volume:   44.10 ml  19.41 ml/m LA Vol (A4C):   99.8 ml 43.92 ml/m LA Biplane Vol: 86.8 ml 38.20 ml/m  AORTIC VALVE LVOT Vmax:   95.90 cm/s LVOT Vmean:  56.300 cm/s LVOT VTI:    0.171 m  AORTA Ao Root diam: 4.00 cm MITRAL VALVE MV Area (PHT): 4.80 cm    SHUNTS MV Decel Time: 158 msec    Systemic VTI:  0.17 m MV E velocity: 83.30 cm/s  Systemic Diam: 2.10 cm MV A velocity: 66.00 cm/s MV E/A ratio:  1.26 Vishnu Priya Mallipeddi Electronically signed by Lorelee Cover Mallipeddi Signature Date/Time: 11/11/2022/11:34:08 AM    Final    US Venous Img Lower Bilateral (DVT)  Result Date: 11/10/2022 CLINICAL DATA:  [id number] Pulmonary emboli ([place]) [id number] EXAM: BILATERAL LOWER EXTREMITY VENOUS DOPPLER ULTRASOUND TECHNIQUE: Gray-scale sonography with graded compression, as well as color Doppler and duplex ultrasound were performed to evaluate the lower extremity deep venous systems from the level of the common femoral vein and including the common femoral, femoral, profunda femoral, popliteal and calf veins including the posterior tibial, peroneal and gastrocnemius veins when visible. The superficial great saphenous vein was also interrogated. Spectral Doppler was utilized to evaluate flow at rest and with distal augmentation maneuvers in the common femoral, femoral and popliteal veins. COMPARISON:  LEFT lower extremity venous duplex, 11/14/2017 FINDINGS: RIGHT LOWER EXTREMITY VENOUS Heterogeneously echogenic partially-occlusive filling defect within the visualized portions of the RIGHT calf veins, specifically within the posterior tibial and peroneal veins. Normal compressibility of the RIGHT common femoral, superficial femoral, and popliteal veins. Visualized portions of profunda femoral vein and great saphenous vein unremarkable. OTHER No  evidence of superficial thrombophlebitis or abnormal fluid collection. Limitations: none LEFT LOWER EXTREMITY VENOUS Heterogeneously echogenic, partially-occlusive filling defect within the LEFT femoral and popliteal veins. Additional venous filling defects are also noted within the visualized portions of the calf veins, within the paired posterior tibial veins. Normal compressibility of the LEFT common femoral vein. Visualized portions of profunda femoral vein and great saphenous vein unremarkable. OTHER No evidence of superficial thrombophlebitis or abnormal fluid collection. Limitations: none IMPRESSION: Examination is POSITIVE for bilateral lower extremity DVT, femoropopliteal on the LEFT and within the PTV and peroneal veins on the RIGHT. Michaelle Birks, MD [facility] Radiology Electronically Signed   By: Michaelle Birks M.D.   On: 11/10/2022 13:32   CT Angio Chest Pulmonary Embolism (PE) W or WO Contrast  Result Date: 11/09/2022 CLINICAL DATA:  CHF exacerbation Shortness of breath EXAM: CT ANGIOGRAPHY CHEST WITH CONTRAST TECHNIQUE: Multidetector CT imaging of the chest was performed using the standard protocol during bolus administration of intravenous contrast. Multiplanar CT image reconstructions and MIPs were obtained to evaluate the vascular anatomy. RADIATION DOSE REDUCTION: This exam was performed according to the departmental dose-optimization program which includes automated exposure control, adjustment of the mA and/or kV according to patient size and/or use of iterative reconstruction technique. CONTRAST:  183m OMNIPAQUE IOHEXOL 350 MG/ML SOLN COMPARISON:  None available FINDINGS: Cardiovascular: Heart is mildly enlarged. No pulmonary artery embolism. Mediastinum/Nodes: No enlarged mediastinal, axillary, hilar lymph nodes. Mild mediastinal lipomatosis. Lungs/Pleura: Trace left and small right pleural effusions with adjacent atelectasis. Atypical patchy  pleural-based opacity of the right upper lobe best seen on image  29 of series 4, measuring approximately 2.3 x 0.8 cm. Upper Abdomen: No acute abnormality. Musculoskeletal: Multiple left rib deformities consistent with remote healed fractures. Review of the MIP images confirms the above findings. IMPRESSION: 1. No pulmonary artery embolism. 2. Mild cardiomegaly. 3. Trace left and small right pleural effusions with adjacent atelectasis. 4. Atypical patchy pleural-based opacity of the right upper lobe best seen on image 29 of series 4, measuring approximately 2.3 x 0.8 cm. This is likely due to focal atelectasis/pneumonitis, however given the atypical appearance a follow-up chest CT should be performed in 12 weeks to document resolution. Electronically Signed   By: Miachel Roux M.D.   On: 11/09/2022 15:42   DG Chest 2 View  Result Date: 11/09/2022 CLINICAL DATA:  Shortness of breath. EXAM: CHEST - 2 VIEW COMPARISON:  02/02/2016 FINDINGS: The heart is enlarged. Prominent mediastinal and hilar contours. Central vascular congestion, interstitial pulmonary edema and small bilateral pleural effusions consistent with CHF. No pulmonary infiltrates. Remote healed rib fractures and left clavicle fracture. IMPRESSION: CHF with small bilateral pleural effusions. Electronically Signed   By: Marijo Sanes M.D.   On: 11/09/2022 12:11     Discharge Exam: Vitals:   11/14/22 2346 11/15/22 0403  BP: 127/69 135/65  Pulse: 65 65  Resp: 20 18  Temp: 98.3 F (36.8 C) 98.3 F (36.8 C)  SpO2: 92% 91%   Vitals:   11/14/22 2337 11/14/22 2346 11/15/22 0403 11/15/22 0651  BP:  127/69 135/65   Pulse: 65 65 65   Resp: '16 20 18   '$ Temp:  98.3 F (36.8 C) 98.3 F (36.8 C)   TempSrc:  Oral Oral   SpO2: 94% 92% 91%   Weight:   110.2 kg 111.5 kg  Height:        General: Pt is alert, awake, not in acute distress Cardiovascular: RRR, S1/S2 +, no rubs, no gallops Respiratory: CTA bilaterally, no wheezing, no  rhonchi Abdominal: Soft, NT, ND, bowel sounds + Extremities: no edema, no cyanosis    The results of significant diagnostics from this hospitalization (including imaging, microbiology, ancillary and laboratory) are listed below for reference.     Microbiology: Recent Results (from the past 240 hour(s))  Resp panel by RT-PCR (RSV, Flu A&B, Covid) Anterior Nasal Swab     Status: None   Collection Time: 11/09/22 11:20 AM   Specimen: Anterior Nasal Swab  Result Value Ref Range Status   SARS Coronavirus 2 by RT PCR NEGATIVE NEGATIVE Final    Comment: (NOTE) SARS-CoV-2 target nucleic acids are NOT DETECTED.  The SARS-CoV-2 RNA is generally detectable in upper respiratory specimens during the acute phase of infection. The lowest concentration of SARS-CoV-2 viral copies this assay can detect is 138 copies/mL. A negative result does not preclude SARS-Cov-2 infection and should not be used as the sole basis for treatment or other patient management decisions. A negative result may occur with  improper specimen collection/handling, submission of specimen other than nasopharyngeal swab, presence of viral mutation(s) within the areas targeted by this assay, and inadequate number of viral copies(<138 copies/mL). A negative result must be combined with clinical observations, patient history, and epidemiological information. The expected result is Negative.  Fact Sheet for Patients:  EntrepreneurPulse.com.au  Fact Sheet for Healthcare Providers:  IncredibleEmployment.be  This test is no t yet approved or cleared by the [place] FDA and  has been authorized for detection and/or diagnosis of SARS-CoV-2 by FDA under an Emergency Use Authorization (EUA). This EUA will  remain  in effect (meaning this test can be used) for the duration of the COVID-19 declaration under Section 564(b)(1) of the Act, 21 U.S.C.section 360bbb-3(b)(1), unless the authorization  is terminated  or revoked sooner.       Influenza A by PCR NEGATIVE NEGATIVE Final   Influenza B by PCR NEGATIVE NEGATIVE Final    Comment: (NOTE) The Xpert Xpress SARS-CoV-2/FLU/RSV plus assay is intended as an aid in the diagnosis of influenza from Nasopharyngeal swab specimens and should not be used as a sole basis for treatment. Nasal washings and aspirates are unacceptable for Xpert Xpress SARS-CoV-2/FLU/RSV testing.  Fact Sheet for Patients: EntrepreneurPulse.com.au  Fact Sheet for Healthcare Providers: IncredibleEmployment.be  This test is not yet approved or cleared by the [place] FDA and has been authorized for detection and/or diagnosis of SARS-CoV-2 by FDA under an Emergency Use Authorization (EUA). This EUA will remain in effect (meaning this test can be used) for the duration of the COVID-19 declaration under Section 564(b)(1) of the Act, 21 U.S.C. section 360bbb-3(b)(1), unless the authorization is terminated or revoked.     Resp Syncytial Virus by PCR NEGATIVE NEGATIVE Final    Comment: (NOTE) Fact Sheet for Patients: EntrepreneurPulse.com.au  Fact Sheet for Healthcare Providers: IncredibleEmployment.be  This test is not yet approved or cleared by the [place] FDA and has been authorized for detection and/or diagnosis of SARS-CoV-2 by FDA under an Emergency Use Authorization (EUA). This EUA will remain in effect (meaning this test can be used) for the duration of the COVID-19 declaration under Section 564(b)(1) of the Act, 21 U.S.C. section 360bbb-3(b)(1), unless the authorization is terminated or revoked.  Performed at [facility], [address] [place], [place] [address]      Labs: BNP (last 3 results) Recent Labs    11/09/22 1120  BNP [id number]*   Basic Metabolic Panel: Recent Labs  Lab 11/11/22 0541 11/12/22 0447 11/13/22 0425 11/14/22 0437 11/15/22 0651   NA 142 138 140 141 139  K 3.7 3.5 3.4* 3.4* 4.0  CL 100 98 96* 99 95*  CO2 35* 32 34* 34* 34*  GLUCOSE 151* 155* 173* 160* 160*  BUN 20 20 24* 31* 32*  CREATININE 0.99 0.89 1.10 0.99 1.09  CALCIUM 8.6* 8.6* 8.7* 8.6* 9.1  MG  --  1.9  --  2.2 2.3  PHOS 4.5  --   --   --   --    Liver Function Tests: Recent Labs  Lab 11/09/22 1120 11/10/22 0516 11/11/22 0541 11/13/22 0425 11/14/22 0437 11/15/22 0651  AST 34 22  --  11* 11* 13*  ALT 55* 50*  --  '25 21 21  '$ ALKPHOS 83 76  --  68 63 69  BILITOT 0.6 0.5  --  0.7 0.4 0.6  PROT 6.2* 6.2*  --  6.7 6.5 6.8  ALBUMIN 3.1* 3.0* 3.0* 3.1* 2.9* 3.1*   No results for input(s): "LIPASE", "AMYLASE" in the last 168 hours. No results for input(s): "AMMONIA" in the last 168 hours. CBC: Recent Labs  Lab 11/09/22 1120 11/12/22 0447 11/13/22 0425 11/14/22 0437  WBC 6.6 7.8 11.0* 9.6  NEUTROABS 4.8  --   --   --   HGB 12.7* 13.6 13.4 13.3  HCT 42.0 43.7 42.7 43.2  MCV 97.0 95.6 94.3 95.6  PLT 122* 157 168 176   Cardiac Enzymes: No results for input(s): "CKTOTAL", "CKMB", "CKMBINDEX", "TROPONINI" in the last 168 hours. BNP: Invalid input(s): "POCBNP" CBG: Recent Labs  Lab  11/14/22 0719 11/14/22 1157 11/14/22 1615 11/14/22 2130 11/15/22 0716  GLUCAP 151* 224* 147* 213* 170*   D-Dimer No results for input(s): "DDIMER" in the last 72 hours. Hgb A1c No results for input(s): "HGBA1C" in the last 72 hours. Lipid Profile Recent Labs    11/13/22 0425  CHOL 251*  HDL 38*  LDLCALC 172*  TRIG 206*  CHOLHDL 6.6   Thyroid function studies Recent Labs    11/13/22 0425  TSH 0.553   Anemia work up No results for input(s): "VITAMINB12", "FOLATE", "FERRITIN", "TIBC", "IRON", "RETICCTPCT" in the last 72 hours. Urinalysis No results found for: "COLORURINE", "APPEARANCEUR", "LABSPEC", "PHURINE", "GLUCOSEU", "HGBUR", "BILIRUBINUR", "KETONESUR", "PROTEINUR", "UROBILINOGEN", "NITRITE", "LEUKOCYTESUR" Sepsis Labs Recent Labs  Lab  11/09/22 1120 11/12/22 0447 11/13/22 0425 11/14/22 0437  WBC 6.6 7.8 11.0* 9.6   Microbiology Recent Results (from the past 240 hour(s))  Resp panel by RT-PCR (RSV, Flu A&B, Covid) Anterior Nasal Swab     Status: None   Collection Time: 11/09/22 11:20 AM   Specimen: Anterior Nasal Swab  Result Value Ref Range Status   SARS Coronavirus 2 by RT PCR NEGATIVE NEGATIVE Final    Comment: (NOTE) SARS-CoV-2 target nucleic acids are NOT DETECTED.  The SARS-CoV-2 RNA is generally detectable in upper respiratory specimens during the acute phase of infection. The lowest concentration of SARS-CoV-2 viral copies this assay can detect is 138 copies/mL. A negative result does not preclude SARS-Cov-2 infection and should not be used as the sole basis for treatment or other patient management decisions. A negative result may occur with  improper specimen collection/handling, submission of specimen other than nasopharyngeal swab, presence of viral mutation(s) within the areas targeted by this assay, and inadequate number of viral copies(<138 copies/mL). A negative result must be combined with clinical observations, patient history, and epidemiological information. The expected result is Negative.  Fact Sheet for Patients:  EntrepreneurPulse.com.au  Fact Sheet for Healthcare Providers:  IncredibleEmployment.be  This test is no t yet approved or cleared by the [place] FDA and  has been authorized for detection and/or diagnosis of SARS-CoV-2 by FDA under an Emergency Use Authorization (EUA). This EUA will remain  in effect (meaning this test can be used) for the duration of the COVID-19 declaration under Section 564(b)(1) of the Act, 21 U.S.C.section 360bbb-3(b)(1), unless the authorization is terminated  or revoked sooner.       Influenza A by PCR NEGATIVE NEGATIVE Final   Influenza B by PCR NEGATIVE NEGATIVE Final    Comment: (NOTE) The Xpert  Xpress SARS-CoV-2/FLU/RSV plus assay is intended as an aid in the diagnosis of influenza from Nasopharyngeal swab specimens and should not be used as a sole basis for treatment. Nasal washings and aspirates are unacceptable for Xpert Xpress SARS-CoV-2/FLU/RSV testing.  Fact Sheet for Patients: EntrepreneurPulse.com.au  Fact Sheet for Healthcare Providers: IncredibleEmployment.be  This test is not yet approved or cleared by the [place] FDA and has been authorized for detection and/or diagnosis of SARS-CoV-2 by FDA under an Emergency Use Authorization (EUA). This EUA will remain in effect (meaning this test can be used) for the duration of the COVID-19 declaration under Section 564(b)(1) of the Act, 21 U.S.C. section 360bbb-3(b)(1), unless the authorization is terminated or revoked.     Resp Syncytial Virus by PCR NEGATIVE NEGATIVE Final    Comment: (NOTE) Fact Sheet for Patients: EntrepreneurPulse.com.au  Fact Sheet for Healthcare Providers: IncredibleEmployment.be  This test is not yet approved or cleared by the [place] and  has been authorized for detection and/or diagnosis of SARS-CoV-2 by FDA under an Emergency Use Authorization (EUA). This EUA will remain in effect (meaning this test can be used) for the duration of the COVID-19 declaration under Section 564(b)(1) of the Act, 21 U.S.C. section 360bbb-3(b)(1), unless the authorization is terminated or revoked.  Performed at [facility], [address] [place], [place] [address]      Time coordinating discharge: 35 minutes  SIGNED:   Rodena Goldmann, DO [facility] 11/15/2022, 11:06 AM  If 7PM-7AM, please contact night-coverage [email or web address]

## 2022-11-15 NOTE — Progress Notes (Signed)
Rounding Note    Patient Name: Noah Hampton Date of Encounter: 11/15/2022  [place] Cardiologist: none here, KOTLABA,DAVID, MD ([place] in [place])  Subjective   No acute events overnight.  No symptoms of chest pain, SOB.  He can walk with no symptoms.  Inpatient Medications    Scheduled Meds:  apixaban  10 mg Oral BID   Followed by   Derrill Memo ON 11/18/2022] apixaban  5 mg Oral BID   carvedilol  6.25 mg Oral BID WC   citalopram  20 mg Oral Daily   colchicine  0.6 mg Oral BID   dapagliflozin propanediol  10 mg Oral Daily   dextromethorphan-guaiFENesin  1 tablet Oral BID   furosemide  80 mg Oral Daily   insulin aspart  0-5 Units Subcutaneous QHS   insulin aspart  0-9 Units Subcutaneous TID WC   potassium chloride  40 mEq Oral BID   sacubitril-valsartan  1 tablet Oral BID   sodium chloride flush  3 mL Intravenous Q12H   sodium chloride flush  3 mL Intravenous Q12H   spironolactone  25 mg Oral Daily   traZODone  100 mg Oral QHS   Continuous Infusions:  sodium chloride     PRN Meds: sodium chloride, acetaminophen **OR** acetaminophen, bisacodyl, hydrALAZINE, HYDROmorphone (DILAUDID) injection, ipratropium-albuterol, ondansetron **OR** ondansetron (ZOFRAN) IV, polyethylene glycol, sodium chloride flush   Vital Signs    Vitals:   11/14/22 2346 11/15/22 0403 11/15/22 0651 11/15/22 0800  BP: 127/69 135/65    Pulse: 65 65    Resp: 20 18    Temp: 98.3 F (36.8 C) 98.3 F (36.8 C)    TempSrc: Oral Oral    SpO2: 92% 91%  92%  Weight:  110.2 kg 111.5 kg   Height:        Intake/Output Summary (Last 24 hours) at 11/15/2022 1112 Last data filed at 11/15/2022 0700 Gross per 24 hour  Intake 960 ml  Output 2650 ml  Net -1690 ml      11/15/2022    6:51 AM 11/15/2022    4:03 AM 11/14/2022    4:58 AM  Last 3 Weights  Weight (lbs) 245 lb 13 oz 242 lb 15.2 oz 244 lb 0.8 oz  Weight (kg) 111.5 kg 110.2 kg 110.7 kg      Telemetry    SR to SB 58 last pm - Personally  Reviewed  ECG    No new - Personally Reviewed  Physical Exam    GEN: No acute distress.   Neck: No JVD Cardiac: RRR, no murmurs, rubs, or gallops.  Respiratory: Clear to some diminished auscultation bilaterally. GI: Soft, nontender, non-distended  MS: No edema; No deformity. Neuro:  Nonfocal  Psych: Normal affect   Labs    High Sensitivity Troponin:   Recent Labs  Lab 11/09/22 1120 11/09/22 1323  TROPONINIHS 24* 26*     Chemistry Recent Labs  Lab 11/12/22 0447 11/13/22 0425 11/14/22 0437 11/15/22 0651  NA 138 140 141 139  K 3.5 3.4* 3.4* 4.0  CL 98 96* 99 95*  CO2 32 34* 34* 34*  GLUCOSE 155* 173* 160* 160*  BUN 20 24* 31* 32*  CREATININE 0.89 1.10 0.99 1.09  CALCIUM 8.6* 8.7* 8.6* 9.1  MG 1.9  --  2.2 2.3  PROT  --  6.7 6.5 6.8  ALBUMIN  --  3.1* 2.9* 3.1*  AST  --  11* 11* 13*  ALT  --  '25 21 21  '$ ALKPHOS  --  68 63 69  BILITOT  --  0.7 0.4 0.6  GFRNONAA >60 >60 >60 >60  ANIONGAP '8 10 8 10    '$ Lipids  Recent Labs  Lab 11/13/22 0425  CHOL 251*  TRIG 206*  HDL 38*  LDLCALC 172*  CHOLHDL 6.6    Hematology Recent Labs  Lab 11/12/22 0447 11/13/22 0425 11/14/22 0437  WBC 7.8 11.0* 9.6  RBC 4.57 4.53 4.52  HGB 13.6 13.4 13.3  HCT 43.7 42.7 43.2  MCV 95.6 94.3 95.6  MCH 29.8 29.6 29.4  MCHC 31.1 31.4 30.8  RDW 13.7 13.9 14.0  PLT 157 168 176   Thyroid  Recent Labs  Lab 11/13/22 0425  TSH 0.553    BNP Recent Labs  Lab 11/09/22 1120  BNP 436.0*    DDimer No results for input(s): "DDIMER" in the last 168 hours.   Radiology    No results found.  Cardiac Studies   2d echo 10/2022     1. Left ventricular ejection fraction, by estimation, is 25 to 30%. The  left ventricle has severely decreased function. The left ventricle  demonstrates global hypokinesis. There is mild left ventricular  hypertrophy. Left ventricular diastolic parameters   are consistent with Grade I diastolic dysfunction (impaired relaxation).   2. Right  ventricular systolic function is low normal. The right  ventricular size is normal. Tricuspid regurgitation signal is inadequate  for assessing PA pressure.   3. Left atrial size was moderately dilated.   4. The mitral valve is normal in structure. No evidence of mitral valve  regurgitation. No evidence of mitral stenosis.   5. The aortic valve is tricuspid. There is moderate calcification of the  aortic valve. Aortic valve regurgitation is not visualized. No aortic  stenosis is present.   6. Aortic dilatation noted. There is mild dilatation of the aortic root,  measuring 40 mm.   7. The inferior vena cava is dilated in size with >50% respiratory  variability, suggesting right atrial pressure of 8 mmHg.   Comparison(s): No prior Echocardiogram.     Patient Profile     72 y.o. male with HTN, NIDDM, dyslipidemia, obesity, OSA-on C-pap, remote stress test/cath in [place] said to be OK in [place], [place] normal 2017 presented with 1 month hx of worsening DOE, PND, BLE edema. Followed by outside cardiologist with recent med adjustment, also seen by pulmonologist and put on nighttime O2. Symptoms progressed prompting admission to the hospital found to have acute hypoxic respiratory failure and possible PNA. EKG also showing progression of LBBB.    Assessment & Plan   1. Acute systolic heart failure exacerbation, LVEF 25 to 30% (new onset) -Continue p.o. Lasix 80 mg once daily -Continue carvedilol 6.25 mg twice daily -Continue Entresto 24-26 mg twice daily -Continue spironolactone 25 mg once daily -Continue Farxiga 10 mg once daily -Outpatient CT cardiac for ischemia evaluation -EKG showed QRS 140 ms with nonspecific intermittent conduction delay.  If progresses with LBBB, he will benefit from CRT-D in the future if LVEF continues to be less than 35%.   2. Acute hypoxic respiratory failure due to CHF +/- PNA - CTA negative for PE, trace L, small R pleural effusions, atypical patchy opacity RUL  (?atx vs pneumonitis, will need f/u CT in 12 weeks per radiology - needs attention to this on follow-up) -Follow-up with primary with antibiotic management   3. BLE DVT (femoropopliteal on the left and within the PTV and peroneal veins on the right)   -Continue  Eliquis per primary team   4. Essential HTN -Manage as above   5. OSA - per notes, patient was using CPAP religiously every day but his pulmonologist recently switched his CPAP to home oxygen at nighttime. has been using CPAP here   6.  Acute myocardial injury with no evidence of myocardial infarction likely secondary to volume overload, pneumonia -No intervention   7.  HLD - LDL 172, trig 206 -> would initiate statin rx when done with colchicine, can be done as outpatient   8. Gout flare - receiving colchicine for gout management -Steroids held on 11/13/2022 due to concurrent heart failure  [facility] will sign off.   Medication Recommendations: P.o. Lasix 80 mg once daily, carvedilol 6.25 mg twice daily, Entresto 24-26 mg twice daily, spironolactone 25 mg once daily, Farxiga 10 mg once daily.  Eliquis per primary team.  Statin will be introduced after he completes colchicine treatment for gout. Other recommendations (labs, testing, etc): Outpatient CTA cardiac Follow up as an outpatient: Cardiology follow-up appointment in 1 month upon discharge (after CTA cardiac is resulted).    I have spent a total of 30 minutes with patient reviewing chart, EKGs, labs and examining patient as well as establishing an assessment and plan that was discussed with the patient.  > 50% of time was spent in direct patient care.     Wasim Hurlbut Fidel Levy, MD [place]  11:12 AM

## 2022-11-23 DIAGNOSIS — R5381 Other malaise: Secondary | ICD-10-CM | POA: Insufficient documentation

## 2022-12-17 ENCOUNTER — Telehealth: Payer: Self-pay | Admitting: Cardiology

## 2022-12-17 NOTE — Telephone Encounter (Signed)
Pt was to have procedure post hospital.  He is not returning calls to arrange.  Will hold off scheduling until pt comes for follow up

## 2022-12-20 ENCOUNTER — Telehealth (HOSPITAL_COMMUNITY): Payer: Self-pay | Admitting: *Deleted

## 2022-12-20 MED ORDER — METOPROLOL TARTRATE 100 MG PO TABS: ORAL_TABLET | ORAL | 0 refills | Status: DC

## 2022-12-20 NOTE — Telephone Encounter (Signed)
Reaching out to patient to offer assistance regarding upcoming cardiac imaging study; pt verbalizes understanding of appt date/time, parking situation and where to check in, pre-test NPO status and medications ordered, and verified current allergies; name and call back number provided for further questions should they arise  Gordy Clement RN Navigator Cardiac Imaging [facility] [phone] office [phone] cell  Patient to hold his carvedilol and take 100mg  metoprolol tartrate two hours prior to his cardiac CT scan. He is aware to arrive at 3pm.

## 2022-12-23 ENCOUNTER — Ambulatory Visit (HOSPITAL_BASED_OUTPATIENT_CLINIC_OR_DEPARTMENT_OTHER)
Admission: RE | Admit: 2022-12-23 | Discharge: 2022-12-23 | Disposition: A | Discharge status: Home / Self Care | Payer: BC Managed Care – PPO | Source: Ambulatory Visit | Attending: Cardiovascular Disease | Admitting: Cardiovascular Disease

## 2022-12-23 ENCOUNTER — Ambulatory Visit (HOSPITAL_COMMUNITY)
Admission: RE | Admit: 2022-12-23 | Discharge: 2022-12-23 | Disposition: A | Discharge status: Home / Self Care | Payer: BC Managed Care – PPO | Source: Ambulatory Visit | Attending: Cardiology | Admitting: Cardiology

## 2022-12-23 ENCOUNTER — Other Ambulatory Visit: Payer: Self-pay | Admitting: Cardiovascular Disease

## 2022-12-23 DIAGNOSIS — R931 Abnormal findings on diagnostic imaging of heart and coronary circulation: Secondary | ICD-10-CM | POA: Diagnosis not present

## 2022-12-23 DIAGNOSIS — R079 Chest pain, unspecified: Secondary | ICD-10-CM | POA: Diagnosis present

## 2022-12-23 DIAGNOSIS — I2510 Atherosclerotic heart disease of native coronary artery without angina pectoris: Secondary | ICD-10-CM

## 2022-12-23 MED ORDER — IOHEXOL 350 MG/ML SOLN
100.0000 mL | Freq: Once | INTRAVENOUS | Status: AC | PRN
  Administered 2022-12-23: 100 mL via INTRAVENOUS

## 2022-12-23 MED ORDER — NITROGLYCERIN 0.4 MG SL SUBL
SUBLINGUAL_TABLET | SUBLINGUAL | Status: AC
  Filled 2022-12-23: qty 2

## 2022-12-23 MED ORDER — NITROGLYCERIN 0.4 MG SL SUBL: 0.8000 mg | SUBLINGUAL_TABLET | Freq: Once | SUBLINGUAL | Status: DC

## 2022-12-24 ENCOUNTER — Other Ambulatory Visit: Payer: Self-pay

## 2022-12-24 DIAGNOSIS — I25119 Atherosclerotic heart disease of native coronary artery with unspecified angina pectoris: Secondary | ICD-10-CM

## 2022-12-24 DIAGNOSIS — E785 Hyperlipidemia, unspecified: Secondary | ICD-10-CM

## 2022-12-25 ENCOUNTER — Telehealth: Payer: Self-pay

## 2022-12-25 NOTE — Telephone Encounter (Signed)
-----   Message from Noah Serge, NP sent at 12/24/2022  2:05 PM EDT ----- Noah Hampton with CAD and need for cholesterol management.  Needs lipid clinic appt please arrange. Noah Hampton needs followup with Dr. Dellia Cloud in next two weeks.  Or if going back to prior cardiologist please send copy to them.    Cecilie Kicks, FNP-C

## 2022-12-25 NOTE — Telephone Encounter (Signed)
Patient notified and verbalized understanding. Patient ask that we send a copy of these results to his cardiologist in [place], as he would like to follow up there. PCP copied.

## 2022-12-31 ENCOUNTER — Telehealth: Payer: Self-pay | Admitting: Internal Medicine

## 2022-12-31 ENCOUNTER — Telehealth: Payer: Self-pay | Admitting: *Deleted

## 2022-12-31 ENCOUNTER — Ambulatory Visit: Payer: BC Managed Care – PPO | Attending: Internal Medicine | Admitting: Internal Medicine

## 2022-12-31 ENCOUNTER — Encounter: Payer: Self-pay | Admitting: Internal Medicine

## 2022-12-31 VITALS — BP 160/92 | HR 71 | Ht 68.0 in | Wt 258.8 lb

## 2022-12-31 DIAGNOSIS — R9389 Abnormal findings on diagnostic imaging of other specified body structures: Secondary | ICD-10-CM | POA: Diagnosis not present

## 2022-12-31 DIAGNOSIS — R0602 Shortness of breath: Secondary | ICD-10-CM

## 2022-12-31 DIAGNOSIS — I429 Cardiomyopathy, unspecified: Secondary | ICD-10-CM

## 2022-12-31 DIAGNOSIS — Z79899 Other long term (current) drug therapy: Secondary | ICD-10-CM

## 2022-12-31 DIAGNOSIS — R21 Rash and other nonspecific skin eruption: Secondary | ICD-10-CM | POA: Insufficient documentation

## 2022-12-31 DIAGNOSIS — I25119 Atherosclerotic heart disease of native coronary artery with unspecified angina pectoris: Secondary | ICD-10-CM

## 2022-12-31 DIAGNOSIS — R931 Abnormal findings on diagnostic imaging of heart and coronary circulation: Secondary | ICD-10-CM | POA: Insufficient documentation

## 2022-12-31 MED ORDER — FUROSEMIDE 80 MG PO TABS: 80.0000 mg | ORAL_TABLET | Freq: Every day | ORAL | 6 refills | Status: DC

## 2022-12-31 MED ORDER — ENTRESTO 49-51 MG PO TABS: 1.0000 | ORAL_TABLET | Freq: Two times a day (BID) | ORAL | 6 refills | Status: DC

## 2022-12-31 NOTE — Telephone Encounter (Signed)
   Pre-operative Risk Assessment    Patient Name: Noah Hampton  DOB: [birth year] MRN: [id number]      Request for Surgical Clearance    Procedure:   Left Heart Cath  Date of Surgery:  Clearance 01/07/23                                 Surgeon:  Dr. Clifton James Surgeon's Group or Practice Name:  [facility] Phone number:  [phone] Fax number:  [phone]   Type of Clearance Requested:   - Pharmacy:  Hold Apixaban (Eliquis) 48 hours before heart cath on 01/07/2023 Per Mallipeddi-need lovenox bridging  Type of Anesthesia:   other   Additional requests/questions:   Per Mallipeddi, need lovenox bridging for heart cath due to [employer]   12/31/2022, 4:59 PM
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
## 2022-12-31 NOTE — Telephone Encounter (Signed)
Left heart cath dx:  cardiomyopathy and abnormal Coronary CTA, Tuesday, January 07, 2023 @1 :30 am with Dr. Clifton James

## 2022-12-31 NOTE — Patient Instructions (Signed)
Medication Instructions:  Your physician has recommended you make the following change in your medication:  Take furosemide 80 mg daily at 4:00 pm Start entresto 49/51 mg twice daily Please get cetirizine 10 mg and take daily (over the counter) Continue other medications the same  Labwork: CMET, CBC & BNP-tomorrow at [facility] ([address] [place])  Testing/Procedures: Your physician has requested that you have a cardiac catheterization. Cardiac catheterization is used to diagnose and/or treat various heart conditions. Doctors may recommend this procedure for a number of different reasons. The most common reason is to evaluate chest pain. Chest pain can be a symptom of coronary artery disease (CAD), and cardiac catheterization can show whether plaque is narrowing or blocking your heart's arteries. This procedure is also used to evaluate the valves, as well as measure the blood flow and oxygen levels in different parts of your heart. For further information please visit [email or web address] Please follow instruction sheet, as given.  Follow-Up: Your physician recommends that you schedule a follow-up appointment in: 1 month  Any Other Special Instructions Will Be Listed Below (If Applicable).  If you need a refill on your cardiac medications before your next appointment, please call your pharmacy.   [facility] A DEPT OF [facility] [address] [place] [id number] [facility] [place] [address] Dept: [phone] Loc: [phone]  Dorien Denno  12/31/2022  You are scheduled for a Cardiac Catheterization on Tuesday, April 16 with Dr. Verne Carrow.  1. Please arrive at the [facility] (Main Entrance A) at [facility]: [address] [place], [place] [address] at 11:30 AM (This time is two hours before your procedure to ensure your preparation). Free valet parking service is available.   Special note: Every  effort is made to have your procedure done on time. Please understand that emergencies sometimes delay scheduled procedures.  2. Diet: Do not eat solid foods after midnight.  The patient may have clear liquids until 5am upon the day of the procedure.  3. Labs: You will need to have blood drawn on Wednesday, April 10 at [facility]. You do not need to be fasting.  4. Medication instructions in preparation for your procedure: Hold morning doses of furosemide, potassium, spironolactone, farxiga, and glimeperide on the morning of your cath. You may take the rest of your medications that morning with a sip of water.   Contrast Allergy: No  Stop taking Eliquis (Apixiban) on Saturday, April 14.  On the morning of your procedure, take your Aspirin 81 mg and any morning medicines NOT listed above.  You may use sips of water.  5. Plan to go home the same day, you will only stay overnight if medically necessary. 6. Bring a current list of your medications and current insurance cards. 7. You MUST have a responsible person to drive you home. 8. Someone MUST be with you the first 24 hours after you arrive home or your discharge will be delayed. 9. Please wear clothes that are easy to get on and off and wear slip-on shoes.  Thank you for allowing [place] to care for you!   -- [facility]

## 2023-01-01 MED ORDER — ENOXAPARIN SODIUM 120 MG/0.8ML IJ SOSY: PREFILLED_SYRINGE | INTRAMUSCULAR | 0 refills | Status: DC

## 2023-01-01 NOTE — Telephone Encounter (Signed)
Patient with diagnosis of DVT 10/2022 on Eliquis 5mg  BID for anticoagulation.    Procedure: left heart cath Date of procedure: 01/07/23  CrCl 51mL/min using adj body weight Platelet count 176K  Pt seen by Dr Jenene Slicker yesterday, "Eliquis will need to be held for 48 hours prior to [facility] however since he had proximal DVT diagnosed in 2/24, will start [place] Lovenox 120 mg twice daily while off Eliquis the lowest dose of [place] Lovenox should be the night prior to the procedure. Resume Eliquis after the procedure."  Will call pt to discuss Lovenox bridging. Typically give last dose of Lovenox in the AM the day before procedure, per MD note yesterday, last dose should be given in the evening the day before procedure. Cath is scheduled for 1:30pm so will still have some washout if a PM dose is given the day before. Will confirm with Dr Clifton James who is performing cath that he's ok with last dose of Lovenox the evening before procedure.  Anticipated bridging schedule as follows, will call pt to discuss once timing of last Lovenox dose is confirmed:  Take Eliquis twice daily through 4/13 with last dose that evening 4/14: Inject Lovenox 120mg  SQ twice daily, no Eliquis 4/15: Inject Lovenox 120mg  SQ in AM, will confirm with MD on PM dose. No Eliquis 4/16: cath at 1:30pm, no Lovenox, no Eliquis 4/17: resume Eliquis as advised by MD

## 2023-01-01 NOTE — Telephone Encounter (Signed)
Pt also has possible alpha gal allergy listed on his allergy list. Enoxaparin is derived from porcine intestinal tissue so concern is that this may elicit an immunologic reaction.   Called pt to clarify. States his MD told him he had a tick bite a few years ago, however he eats red meat without any issue. Had a burger earlier today. Never any anaphylaxis, GI issues, or breathing issues. Ok to use Lovenox. Discussed below bridging with pt over the phone since he lives in [place].   Take Eliquis twice daily through 4/13 with last dose that evening 4/14: Inject Lovenox 120mg  SQ twice daily at 6am and 6pm, no Eliquis 4/15: Inject Lovenox 120mg  SQ twice daily at 6am and 6pm. No Eliquis 4/16: cath at 1:30pm, no Lovenox, no Eliquis 4/17: resume Eliquis as advised by MD

## 2023-01-01 NOTE — Telephone Encounter (Signed)
Dr. Clifton James,  Just wanted to make sure you saw the latest update from pharm D dated 01/01/2023:  Pt also has possible alpha gal allergy listed on his allergy list. Enoxaparin is derived from porcine intestinal tissue so concern is that this may elicit an immunologic reaction.    Called pt to clarify. States his MD told him he had a tick bite a few years ago, however he eats red meat without any issue. Had a burger earlier today. Never any anaphylaxis, GI issues, or breathing issues. Ok to use Lovenox. Discussed below bridging with pt over the phone since he lives in [place].    Take Eliquis twice daily through 4/13 with last dose that evening 4/14: Inject Lovenox 120mg  SQ twice daily at 6am and 6pm, no Eliquis 4/15: Inject Lovenox 120mg  SQ twice daily at 6am and 6pm. No Eliquis 4/16: cath at 1:30pm, no Lovenox, no Eliquis 4/17: resume Eliquis as advised by MD  I will remove from preop pool.    Thank you!  DW

## 2023-01-02 LAB — CBC
MCHC: 32.2 g/dL (ref 31.5–35.7)
MCV: 93 fL (ref 79–97)

## 2023-01-02 LAB — COMPREHENSIVE METABOLIC PANEL
BUN: 16 mg/dL (ref 8–27)
Bilirubin Total: <0.2 mg/dL (ref 0.0–1.2)
CO2: 22 mmol/L (ref 20–29)

## 2023-01-02 LAB — BRAIN NATRIURETIC PEPTIDE

## 2023-01-03 LAB — COMPREHENSIVE METABOLIC PANEL
ALT: 19 IU/L (ref 0–44)
AST: 17 IU/L (ref 0–40)
Albumin/Globulin Ratio: 1.5 (ref 1.2–2.2)
Albumin: 3.8 g/dL (ref 3.8–4.8)
Alkaline Phosphatase: 70 IU/L (ref 44–121)
BUN/Creatinine Ratio: 21 (ref 10–24)
Calcium: 9.2 mg/dL (ref 8.6–10.2)
Chloride: 104 mmol/L (ref 96–106)
Creatinine, Ser: 0.77 mg/dL (ref 0.76–1.27)
Globulin, Total: 2.5 g/dL (ref 1.5–4.5)
Glucose: 134 mg/dL — ABNORMAL HIGH (ref 70–99)
Potassium: 4.5 mmol/L (ref 3.5–5.2)
Sodium: 142 mmol/L (ref 134–144)
Total Protein: 6.3 g/dL (ref 6.0–8.5)
eGFR: 95 mL/min/{1.73_m2} (ref >59)

## 2023-01-03 LAB — CBC
Hematocrit: 42.5 % (ref 37.5–51.0)
Hemoglobin: 13.7 g/dL (ref 13.0–17.7)
MCH: 30 pg (ref 26.6–33.0)
Platelets: 134 10*3/uL — ABNORMAL LOW (ref 150–450)
RBC: 4.57 x10E6/uL (ref 4.14–5.80)
RDW: 14 % (ref 11.6–15.4)
WBC: 7.2 10*3/uL (ref 3.4–10.8)

## 2023-01-06 ENCOUNTER — Telehealth: Payer: Self-pay | Admitting: *Deleted

## 2023-01-06 MED ORDER — APIXABAN 5 MG PO TABS: 5.0000 mg | ORAL_TABLET | Freq: Two times a day (BID) | ORAL | 1 refills | Status: DC

## 2023-01-06 NOTE — Telephone Encounter (Addendum)
I had an extensive conversation with him on the phone about bridging off of Eliquis, this was also discussed with him at MD visit on 4/9. Not sure how he is now reporting he hasn't been on the med for the past ~2 months...  Glad he has injected his Lovenox correctly at least. Not sure if untreated DVT would affect plan for cath tomorrow. If not, he will need to start Eliquis 5mg  BID after his cath, ideally within 24 hrs or so after but MD can weigh in after cath for specific timing. I have sent in a new Eliquis prescription to his pharmacy.

## 2023-01-06 NOTE — Telephone Encounter (Signed)
Patient reports he does not have Eliquis and he does not remember taking Eliquis in the past. I called [facility] in [place], [place] and spoke with [place]. [place] reports they have two prescriptions written by Dr Sherryll Burger for Eliquis dated 11/15/22: -Eliquis 5 mg #8 - two tablets twice a day-this prescription was picked up. -Eliquis 5 mg # 120 -one tablet twice a day-this prescription was never picked up and is still on hold for patient.  [place] reports patient did pick up 01/01/23 lovenox prescription. Patient reported to me he did take lovenox yesterday (4/14) and this morning (4/15) and plans to do last lovenox injection this PM(4/15).

## 2023-01-06 NOTE — Telephone Encounter (Signed)
Cardiac Catheterization scheduled at [facility] for: Tuesday January 07, 2023 1:30 PM Arrival time [facility] Main Entrance A at: 11:30 AM  Nothing to eat after midnight prior to procedure, clear liquids until 5 AM day of procedure.  Medication instructions: -Hold:  Eliquis-pt reports he is not currently taking this-see notes about this in this phone note.  Lovenox-pt reports he has taken lovenox injections 4/14 and 4/15  as directed (will take last one PM 01/06/23).  Lasix/KCl-AM of procedure  Spironolactone-pt reports he is not taking this.  Glimepiride/Farxiga-AM of procedure -Pt instructed to take aspirin 81 mg, carvedilol, and Entresto AM of procedure with sips of water.  Confirmed patient has responsible adult to drive home post procedure and be with patient first 24 hours after arriving home.  Plan to go home the same day, you will only stay overnight if medically necessary.  Reviewed procedure instructions with patient.  I asked patient to take list of medications and last time taken with him to hospital tomorrow. Patient reports he keeps his medication in a lock box and will plan to take that with him to hospital -he is aware he should let Short Stay nurse know that he has this with him.

## 2023-01-06 NOTE — Addendum Note (Signed)
Addended by: Rodricus Candelaria E on: 01/06/2023 01:12 PM   Modules accepted: Orders

## 2023-01-07 ENCOUNTER — Encounter (HOSPITAL_COMMUNITY)
Admission: RE | Disposition: A | Discharge status: Home / Self Care | Payer: Self-pay | Source: Home / Self Care | Attending: Cardiovascular Disease

## 2023-01-07 ENCOUNTER — Other Ambulatory Visit: Payer: Self-pay

## 2023-01-07 ENCOUNTER — Ambulatory Visit (HOSPITAL_COMMUNITY)
Admission: RE | Admit: 2023-01-07 | Discharge: 2023-01-07 | Disposition: A | Discharge status: Home / Self Care | Payer: BC Managed Care – PPO | Attending: Cardiovascular Disease | Admitting: Cardiovascular Disease

## 2023-01-07 DIAGNOSIS — Z79899 Other long term (current) drug therapy: Secondary | ICD-10-CM | POA: Insufficient documentation

## 2023-01-07 DIAGNOSIS — Z7984 Long term (current) use of oral hypoglycemic drugs: Secondary | ICD-10-CM | POA: Diagnosis not present

## 2023-01-07 DIAGNOSIS — E119 Type 2 diabetes mellitus without complications: Secondary | ICD-10-CM | POA: Diagnosis not present

## 2023-01-07 DIAGNOSIS — Z7901 Long term (current) use of anticoagulants: Secondary | ICD-10-CM | POA: Insufficient documentation

## 2023-01-07 DIAGNOSIS — I428 Other cardiomyopathies: Secondary | ICD-10-CM | POA: Insufficient documentation

## 2023-01-07 DIAGNOSIS — Z86718 Personal history of other venous thrombosis and embolism: Secondary | ICD-10-CM | POA: Diagnosis not present

## 2023-01-07 DIAGNOSIS — I10 Essential (primary) hypertension: Secondary | ICD-10-CM | POA: Diagnosis not present

## 2023-01-07 DIAGNOSIS — G4733 Obstructive sleep apnea (adult) (pediatric): Secondary | ICD-10-CM | POA: Diagnosis not present

## 2023-01-07 DIAGNOSIS — I2510 Atherosclerotic heart disease of native coronary artery without angina pectoris: Secondary | ICD-10-CM | POA: Insufficient documentation

## 2023-01-07 HISTORY — PX: LEFT HEART CATH AND CORONARY ANGIOGRAPHY: CATH118249

## 2023-01-07 LAB — GLUCOSE, CAPILLARY
Glucose-Capillary: 135 mg/dL — ABNORMAL HIGH (ref 70–99)
Glucose-Capillary: 118 mg/dL — ABNORMAL HIGH (ref 70–99)

## 2023-01-07 SURGERY — LEFT HEART CATH AND CORONARY ANGIOGRAPHY
Anesthesia: LOCAL

## 2023-01-07 MED ORDER — ASPIRIN 81 MG PO CHEW: 81.0000 mg | CHEWABLE_TABLET | ORAL | Status: DC

## 2023-01-07 MED ORDER — HEPARIN SODIUM (PORCINE) 1000 UNIT/ML IJ SOLN
INTRAMUSCULAR | Status: AC
  Filled 2023-01-07: qty 10

## 2023-01-07 MED ORDER — HYDRALAZINE HCL 20 MG/ML IJ SOLN: 10.0000 mg | INTRAMUSCULAR | Status: DC | PRN

## 2023-01-07 MED ORDER — VERAPAMIL HCL 2.5 MG/ML IV SOLN
INTRAVENOUS | Status: DC | PRN
  Administered 2023-01-07: 10 mL via INTRA_ARTERIAL

## 2023-01-07 MED ORDER — LIDOCAINE HCL (PF) 1 % IJ SOLN
INTRAMUSCULAR | Status: DC | PRN
  Administered 2023-01-07: 2 mL

## 2023-01-07 MED ORDER — SODIUM CHLORIDE 0.9% FLUSH: 3.0000 mL | Freq: Two times a day (BID) | INTRAVENOUS | Status: DC

## 2023-01-07 MED ORDER — ONDANSETRON HCL 4 MG/2ML IJ SOLN: 4.0000 mg | Freq: Four times a day (QID) | INTRAMUSCULAR | Status: DC | PRN

## 2023-01-07 MED ORDER — HEPARIN SODIUM (PORCINE) 1000 UNIT/ML IJ SOLN
INTRAMUSCULAR | Status: DC | PRN
  Administered 2023-01-07: 6000 [IU] via INTRAVENOUS

## 2023-01-07 MED ORDER — FENTANYL CITRATE (PF) 100 MCG/2ML IJ SOLN
INTRAMUSCULAR | Status: AC
  Filled 2023-01-07: qty 2

## 2023-01-07 MED ORDER — IOHEXOL 350 MG/ML SOLN
INTRAVENOUS | Status: DC | PRN
  Administered 2023-01-07: 45 mL

## 2023-01-07 MED ORDER — FENTANYL CITRATE (PF) 100 MCG/2ML IJ SOLN
INTRAMUSCULAR | Status: DC | PRN
  Administered 2023-01-07 (×2): 25 ug via INTRAVENOUS

## 2023-01-07 MED ORDER — SODIUM CHLORIDE 0.9 % IV SOLN: INTRAVENOUS | Status: AC

## 2023-01-07 MED ORDER — ACETAMINOPHEN 325 MG PO TABS: 650.0000 mg | ORAL_TABLET | ORAL | Status: DC | PRN

## 2023-01-07 MED ORDER — HEPARIN (PORCINE) IN NACL 1000-0.9 UT/500ML-% IV SOLN
INTRAVENOUS | Status: DC | PRN
  Administered 2023-01-07 (×2): 500 mL

## 2023-01-07 MED ORDER — VERAPAMIL HCL 2.5 MG/ML IV SOLN
INTRAVENOUS | Status: AC
  Filled 2023-01-07: qty 2

## 2023-01-07 MED ORDER — MIDAZOLAM HCL 2 MG/2ML IJ SOLN
INTRAMUSCULAR | Status: AC
  Filled 2023-01-07: qty 2

## 2023-01-07 MED ORDER — MIDAZOLAM HCL 2 MG/2ML IJ SOLN
INTRAMUSCULAR | Status: DC | PRN
  Administered 2023-01-07 (×2): 1 mg via INTRAVENOUS

## 2023-01-07 MED ORDER — SODIUM CHLORIDE 0.9 % IV SOLN: 250.0000 mL | INTRAVENOUS | Status: DC | PRN

## 2023-01-07 MED ORDER — SODIUM CHLORIDE 0.9% FLUSH: 3.0000 mL | INTRAVENOUS | Status: DC | PRN

## 2023-01-07 MED ORDER — LABETALOL HCL 5 MG/ML IV SOLN: 10.0000 mg | INTRAVENOUS | Status: DC | PRN

## 2023-01-07 MED ORDER — LIDOCAINE HCL (PF) 1 % IJ SOLN
INTRAMUSCULAR | Status: AC
  Filled 2023-01-07: qty 30

## 2023-01-07 MED ORDER — SODIUM CHLORIDE 0.9 % IV SOLN: INTRAVENOUS | Status: DC

## 2023-01-07 SURGICAL SUPPLY — 9 items
CATH 5FR JL3.5 JR4 ANG PIG MP (CATHETERS) IMPLANT
DEVICE RAD TR BAND REGULAR (VASCULAR PRODUCTS) IMPLANT
GLIDESHEATH SLEND SS 6F .021 (SHEATH) IMPLANT
GUIDEWIRE INQWIRE 1.5J.035X260 (WIRE) IMPLANT
INQWIRE 1.5J .035X260CM (WIRE) ×1
KIT HEART LEFT (KITS) ×1 IMPLANT
PACK CARDIAC CATHETERIZATION (CUSTOM PROCEDURE TRAY) ×1 IMPLANT
TRANSDUCER W/STOPCOCK (MISCELLANEOUS) ×1 IMPLANT
TUBING CIL FLEX 10 FLL-RA (TUBING) ×1 IMPLANT

## 2023-01-07 NOTE — Discharge Instructions (Signed)
Resume Eliquis tomorrow  (01/08/23) if no bleeding from right wrist cath site.

## 2023-01-07 NOTE — Interval H&P Note (Signed)
History and Physical Interval Note:  01/07/2023 1:10 PM  Noah Hampton  has presented today for surgery, with the diagnosis of cardiomyopathy - abnormal cta.  The various methods of treatment have been discussed with the patient and family. After consideration of risks, benefits and other options for treatment, the patient has consented to  Procedure(s): LEFT HEART CATH AND CORONARY ANGIOGRAPHY (N/A) as a surgical intervention.  The patient's history has been reviewed, patient examined, no change in status, stable for surgery.  I have reviewed the patient's chart and labs.  Questions were answered to the patient's satisfaction.    Cath Lab Visit (complete for each Cath Lab visit)  Clinical Evaluation Leading to the Procedure:   ACS: No.  Non-ACS:    Anginal Classification: CCS III  Anti-ischemic medical therapy: Minimal Therapy (1 class of medications)  Non-Invasive Test Results: High-risk stress test findings: cardiac mortality >3%/year(Cardiac CTA with possible severe Circumflex and LAD stenosis)  Prior CABG: No previous CABG        Verne Carrow

## 2023-01-07 NOTE — Progress Notes (Signed)
TR BAND REMOVAL  LOCATION:    right radial  DEFLATED PER PROTOCOL:    Yes.    TIME BAND OFF / DRESSING APPLIED: 01/07/23 at 1540   SITE UPON ARRIVAL:    Level 0  SITE AFTER BAND REMOVAL:    Level 0  CIRCULATION SENSATION AND MOVEMENT:    Within Normal Limits   Yes.    COMMENTS:

## 2023-01-08 ENCOUNTER — Ambulatory Visit: Payer: BC Managed Care – PPO | Admitting: Internal Medicine

## 2023-01-08 ENCOUNTER — Telehealth: Payer: Self-pay | Admitting: Internal Medicine

## 2023-01-08 ENCOUNTER — Encounter (HOSPITAL_COMMUNITY): Payer: Self-pay | Admitting: Cardiovascular Disease

## 2023-01-08 NOTE — Telephone Encounter (Signed)
Patient is calling stating his return to work letter has not been received. He is requesting it be re-faxed to the number previously given and he be called back to be notified once it is sent so he can call to confirm if it was received. Please advise.

## 2023-01-08 NOTE — Telephone Encounter (Signed)
Calling to get a letter fax to his job, stating that he has be release to go back to work. Fax [phone] (atten: [employer] Route). Please advise

## 2023-01-08 NOTE — Telephone Encounter (Signed)
Per Dr. Jenene Slicker, letter sent to [employer] (fax # [phone]), stating that patient is released to return to work. Left message on patients voicemail making him aware.

## 2023-01-08 NOTE — Telephone Encounter (Signed)
Letter re-faxed & patient notified.

## 2023-01-09 ENCOUNTER — Telehealth: Payer: Self-pay | Admitting: Internal Medicine

## 2023-01-09 NOTE — Telephone Encounter (Signed)
Patient called stated that he had a heart cath on Tuesday and the scan showed a blockage, but the doctor stated there was not a blockage. Patient stated that he was told his heart was weak and needed to start taking medication to help his heart. Patient was not informed on which medication he was supposed to start taking and would like to know when he can start the medication to help his heart. Please advise.

## 2023-01-09 NOTE — Telephone Encounter (Signed)
Patient informed and verbalized understanding of plan. Also reminded that eliquis prescription was sent to his pharmacy already on 01/06/2023.

## 2023-01-31 ENCOUNTER — Encounter: Payer: Self-pay | Admitting: Internal Medicine

## 2023-01-31 ENCOUNTER — Ambulatory Visit: Payer: BC Managed Care – PPO | Attending: Internal Medicine | Admitting: Internal Medicine

## 2023-01-31 ENCOUNTER — Telehealth: Payer: Self-pay | Admitting: Internal Medicine

## 2023-01-31 ENCOUNTER — Ambulatory Visit: Payer: BC Managed Care – PPO | Admitting: Internal Medicine

## 2023-01-31 VITALS — BP 148/80 | HR 72 | Ht 68.0 in | Wt 260.0 lb

## 2023-01-31 DIAGNOSIS — I428 Other cardiomyopathies: Secondary | ICD-10-CM

## 2023-01-31 NOTE — Patient Instructions (Addendum)
Medication Instructions:  Your physician recommends that you continue on your current medications as directed. Please refer to the Current Medication list given to you today. Call office this afternoon with a list of your medications  Labwork: none  Testing/Procedures: none  Follow-Up: Your physician recommends that you schedule a follow-up appointment in: 6 weeks  Any Other Special Instructions Will Be Listed Below (If Applicable). Your physician has requested that you regularly monitor and record your blood pressure readings at home. Please use the same machine at the same time of day to check your readings and record them. Call office in 2 weeks with your blood pressure readings for the morning and evening  If you need a refill on your cardiac medications before your next appointment, please call your pharmacy.
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
## 2023-01-31 NOTE — Telephone Encounter (Signed)
Pt calling in to give list of medications to the nurse

## 2023-02-03 MED ORDER — SPIRONOLACTONE 25 MG PO TABS: 25.0000 mg | ORAL_TABLET | Freq: Every day | ORAL | 1 refills | Status: AC

## 2023-02-03 MED ORDER — POTASSIUM CHLORIDE CRYS ER 20 MEQ PO TBCR: 20.0000 meq | EXTENDED_RELEASE_TABLET | Freq: Every day | ORAL | 1 refills | Status: DC

## 2023-02-03 NOTE — Telephone Encounter (Signed)
Medications reviewed and profile updated. Spironolactone is listed as not taking-patient does not have this at home. Please clarify if he is supposed to be on spironolactone.

## 2023-02-03 NOTE — Telephone Encounter (Signed)
Per Mallipeddi-Yes please he is supposed to be taking Spironolactone.  Patient informed and verbalized understanding of plan.

## 2023-02-21 ENCOUNTER — Encounter: Payer: Self-pay | Admitting: Pharmacist Clinician (PhC)/ Clinical Pharmacy Specialist

## 2023-02-21 ENCOUNTER — Telehealth: Payer: Self-pay

## 2023-02-21 ENCOUNTER — Other Ambulatory Visit (HOSPITAL_COMMUNITY): Payer: Self-pay

## 2023-02-21 ENCOUNTER — Ambulatory Visit
Payer: BC Managed Care – PPO | Attending: Cardiology | Admitting: Pharmacist Clinician (PhC)/ Clinical Pharmacy Specialist

## 2023-02-21 ENCOUNTER — Telehealth: Payer: Self-pay | Admitting: Pharmacist Clinician (PhC)/ Clinical Pharmacy Specialist

## 2023-02-21 DIAGNOSIS — E785 Hyperlipidemia, unspecified: Secondary | ICD-10-CM | POA: Diagnosis not present

## 2023-02-21 NOTE — Patient Instructions (Signed)
Your Results:             Your most recent labs Goal  Total Cholesterol 251 < 200  Triglycerides 206 < 150  HDL (happy/good cholesterol) 38 > 40  LDL (lousy/bad cholesterol 172 < 55   Medication changes:  We will start the process to get Repatha (or Praluent) covered by your insurance.  Once the prior authorization is complete, I will call/send a MyChart message to let you know and confirm pharmacy information.   You will take 1 injection every 14 days  Lab orders:  We want to repeat labs after 2-3 months.  We will send you a lab order to remind you once we get closer to that time.    Patient Assistance:  call me if the medication is cost prohibitive.     Thank you for choosing [facility]  Phillips Hay PharmD  [phone]  High Triglycerides Eating Plan Triglycerides are a type of fat in the blood. High levels of triglycerides can increase your risk of heart disease and stroke. If your triglyceride levels are high, choosing the right foods can help lower your triglycerides and keep your heart healthy. Work with your health care provider or a dietitian to develop an eating plan that is right for you. What are tips for following this plan? General guidelines  Lose weight, if you are overweight. For most people, losing 5-10 lb (2-5 kg) helps lower triglyceride levels. A weight-loss plan may include: 30 minutes of exercise at least 5 days a week. Reducing the amount of calories, sugar, and fat you eat. Eat a wide variety of fresh fruits, vegetables, and whole grains. These foods are high in fiber. Eat foods that contain healthy fats, such as fatty fish, nuts, seeds, and olive oil. Avoid foods that are high in added sugar, added salt (sodium), and saturated fat. Avoid low-fiber, refined carbohydrates such as white bread, crackers, noodles, and white rice. Avoid foods with trans fats or partially hydrogenated oils, such as fried foods or stick margarine. If you drink  alcohol: Limit how much you have to: 0-1 drink a day for women who are not pregnant. 0-2 drinks a day for men. Your health care provider may recommend that you drink less than these amounts depending on your overall health. Know how much alcohol is in a drink. In the [place] one drink equals one 12 oz bottle of beer (355 mL), one 5 oz glass of wine (148 mL), or one 1 oz glass of hard liquor (44 mL). Reading food labels Check food labels for: The amount of saturated fat. Choose foods with no or very little saturated fat (less than 2 g). The amount of trans fat. Choose foods with no transfat. The amount of cholesterol. Choose foods that are low in cholesterol. The amount of sodium. Choose foods with less than 140 milligrams (mg) per serving. Shopping Buy dairy products labeled as nonfat (skim) or low-fat (1%). Avoid buying processed or prepackaged foods. These are often high in added sugar, sodium, and fat. Cooking Choose healthy fats when cooking, such as olive oil, avocado oil, or canola oil. Cook foods using lower fat methods, such as baking, broiling, boiling, or grilling. Make your own sauces, dressings, and marinades when possible, instead of buying them. Store-bought sauces, dressings, and marinades are often high in sodium and sugar. Meal planning Eat more home-cooked food and less restaurant, buffet, and fast food. Eat fatty fish at least 2 times each week. Examples of fatty fish include  salmon, trout, sardines, mackerel, tuna, and herring. If you eat whole eggs, do not eat more than 4 egg yolks per week. What foods should I eat? Fruits All fresh, canned (in natural juice), or frozen fruits. Vegetables Fresh or frozen vegetables. Low-sodium canned vegetables. Grains Whole wheat or whole grain breads, crackers, cereals, and pasta. Unsweetened oatmeal. Bulgur. Barley. Quinoa. Brown rice. Whole wheat flour tortillas. Meats and other proteins Skinless chicken or [place]. Ground  chicken or [place]. Lean cuts of pork, trimmed of fat. Fish and seafood, especially salmon, trout, and herring. Egg whites. Dried beans, peas, or lentils. Unsalted nuts or seeds. Unsalted canned beans. Natural peanut or almond butter or other nut butters. Dairy Low-fat dairy products. Skim or low-fat (1%) milk. Reduced fat (2%) and low-sodium cheese. Low-fat ricotta cheese. Low-fat cottage cheese. Plain, low-fat yogurt. Fats and oils Tub margarine without trans fats. Light or reduced-fat mayonnaise. Light or reduced-fat salad dressings. Avocado. Safflower, olive, sunflower, soybean, and canola oils. The items listed above may not be a complete list of recommended foods and beverages. Talk with your dietitian about what dietary choices are best for you. What foods should I avoid? Fruits Sweetened dried fruit. Canned fruit in syrup. Fruit juice. Vegetables Creamed or fried vegetables. Vegetables in a cheese sauce. Grains White bread. White (regular) pasta. White rice. Cornbread. Bagels. Pastries. Crackers that contain trans fat. Meats and other proteins Fatty cuts of meat. Ribs. Chicken wings. Tomasa Blase. Sausage. Bologna. Salami. Chitterlings. Fatback. Hot dogs. Bratwurst. Packaged lunch meats. Dairy Whole or reduced-fat (2%) milk. Half-and-half. Cream cheese. Full-fat or sweetened yogurt. Full-fat cheese. Nondairy creamers. Whipped toppings. Processed cheese or cheese spreads. Cheese curds. Fats and oils Butter. Stick margarine. Lard. Shortening. Ghee. Bacon fat. Tropical oils, such as coconut, palm kernel, or palm oils. Beverages Alcohol. Sweetened drinks, such as soda, lemonade, fruit drinks, or punches. Sweets and desserts Corn syrup. Sugars. Honey. Molasses. Candy. Jam and jelly. Syrup. Sweetened cereals. Cookies. Pies. Cakes. Donuts. Muffins. Ice cream. Condiments Store-bought sauces, dressings, and marinades that are high in sugar, such as ketchup and barbecue sauce. The items listed above  may not be a complete list of foods and beverages you should avoid. Talk with your dietitian about what dietary choices are best for you. Summary High levels of triglycerides can increase the risk of heart disease and stroke. Choosing the right foods can help lower your triglycerides. Eat plenty of fresh fruits, vegetables, and whole grains. Choose low-fat dairy and lean meats. Eat fatty fish at least twice a week. Avoid processed and prepackaged foods with added sugar, sodium, saturated fat, and trans fat. If you need suggestions or have questions about what types of food are good for you, talk with your health care provider or a dietitian. This information is not intended to replace advice given to you by your health care provider. Make sure you discuss any questions you have with your health care provider. Document Revised: 01/19/2021 Document Reviewed: 01/19/2021 Elsevier Patient Education  2024 [employer].

## 2023-02-21 NOTE — Progress Notes (Signed)
Office Visit    Patient Name: Noah Hampton Date of Encounter: 02/21/2023  Primary Care Provider:  Amedeo Plenty, DO Primary Cardiologist:  Marjo Bicker, MD  Chief Complaint    Hyperlipidemia   Significant Past Medical History   CAD Calcium score 413 (66th percentile); LAD at 50-69%, D1 and D2 25-49%  CHF EF at 25-30% - on Entresto, carvedilol, Farxiga, spironolactone  HTN Controlled with GDMT for HF  DM2 10/23 A1c 6.9 - on Farxiga, glimepiride  OSA On CPAP  VTE DVT 2/24 on Eliquis     Allergies  Allergen Reactions   Statins Other (See Comments)     Myalgias (Muscle Pain)   Alpha-Gal Other (See Comments)    Unknown Questionable history of alpha-gal per patient, has not had any issues with medications in the past as of 11/11/22   Other Other (See Comments)    Beef/ Unknown    History of Present Illness    Noah Hampton is a 72 y.o. male patient of Dr Jenene Slicker, in the office to discuss options for cholesterol management.    Insurance Carrier:  Sara Lee  ID [id number]   BIN [id number]  PNC  A4  GRP  [facility]   Also has [employer] Medicare Complete plan  LDL Cholesterol goal:  LDL < 55  Current Medications:  none   Previously tried:  rosuvastatin, pitavastatin - myalgias  Family Hx:  mother died from MVA at 81, father from lung cancer at 71; 2 brothers  one with CABG, other healthy; 3 children healthy   Social Hx: Tobacco: no Alcohol:   no   Diet:    mix of home and eating out, does eat some fast foods, at home eats variety of proteins, but mostly chicken, veggies are fresh or frozen; occasional snacking;  Exercise: finished PT for TKA, no formal exercise, still getting knee up to speed  Adherence Assessment  Do you ever forget to take your medication? [x] Yes - likes to take with food, doesn't always have breakfasta [] No  Do you ever skip doses due to side effects? [] Yes [x] No  Do you have trouble affording your medicines? [] Yes [x] No  Are you ever unable  to pick up your medication due to transportation difficulties? [] Yes [x] No  Do you ever stop taking your medications because you don't believe they are helping? [] Yes [x] No   Adherence strategy: none - keeps all meds in a carry case   Accessory Clinical Findings   Lab Results  Component Value Date   CHOL 251 (H) 11/13/2022   HDL 38 (L) 11/13/2022   LDLCALC 172 (H) 11/13/2022   TRIG 206 (H) 11/13/2022   CHOLHDL 6.6 11/13/2022    Lab Results  Component Value Date   ALT 19 01/01/2023   AST 17 01/01/2023   ALKPHOS 70 01/01/2023   BILITOT <0.2 01/01/2023   Lab Results  Component Value Date   CREATININE 0.77 01/01/2023   BUN 16 01/01/2023   NA 142 01/01/2023   K 4.5 01/01/2023   CL 104 01/01/2023   CO2 22 01/01/2023   Lab Results  Component Value Date   HGBA1C 6.9 (H) 07/15/2022    Home Medications    Current Outpatient Medications  Medication Sig Dispense Refill   apixaban (ELIQUIS) 5 MG TABS tablet Take 1 tablet (5 mg total) by mouth 2 (two) times daily. 180 tablet 1   aspirin EC 81 MG tablet Take 81 mg by mouth daily. Swallow whole.     carvedilol (COREG)  6.25 MG tablet Take 1 tablet (6.25 mg total) by mouth 2 (two) times daily with a meal. 60 tablet 1   Cholecalciferol (VITAMIN D3) 125 MCG (5000 UT) CAPS Take 1 capsule by mouth daily.     colchicine 0.6 MG tablet Take 0.6 mg by mouth daily as needed (gout).     escitalopram (LEXAPRO) 10 MG tablet Take 10 mg by mouth daily.     FARXIGA 10 MG TABS tablet Take 10 mg by mouth daily.     furosemide (LASIX) 80 MG tablet Take 1 tablet (80 mg total) by mouth daily. At 4:00 pm 30 tablet 6   glimepiride (AMARYL) 2 MG tablet Take 2 mg by mouth daily with breakfast.     Menthol-Camphor (TIGER BALM ARTHRITIS RUB EX) Apply 1 Application topically daily as needed (pain).     NON FORMULARY Pt uses a cpap nightly     potassium chloride SA (KLOR-CON M) 20 MEQ tablet Take 1 tablet (20 mEq total) by mouth daily. 90 tablet 1    sacubitril-valsartan (ENTRESTO) 49-51 MG Take 1 tablet by mouth 2 (two) times daily. 60 tablet 6   spironolactone (ALDACTONE) 25 MG tablet Take 1 tablet (25 mg total) by mouth daily. 90 tablet 1   No current facility-administered medications for this visit.     Assessment & Plan    Dyslipidemia Assessment: Patient with ASCVD not at LDL goal of < 55 Most recent LDL 172 on 11/13/22 Not able to tolerate statins secondary to myalgias - pitavastatin, rosuvastatin Reviewed options for lowering LDL cholesterol, including ezetimibe, PCSK-9 inhibitors, bempedoic acid and inclisiran.  Discussed mechanisms of action, dosing, side effects, potential decreases in LDL cholesterol and costs.  Also reviewed potential options for patient assistance.  Plan: Patient agreeable to starting Repatha 140 mg q14d Repeat labs after:  3 months Lipid Liver function Patient was given information on [employer] - will sign him up should price be prohibitive. (He has Medicare and retirement plans)   Phillips Hay, PharmD CPP [facility] [address]  [place], [place] [address] [phone]  02/21/2023, 1:12 PM

## 2023-02-21 NOTE — Telephone Encounter (Signed)
Pharmacy Patient Advocate Encounter  Prior Authorization for Repatha 140mg / has been APPROVED by [facility] from 5.31.24 to 5.31.25.   KEY# [id number]

## 2023-02-21 NOTE — Telephone Encounter (Signed)
Please do PA for Repatha 140 mg (or Praluent 150 if that is preferred).  He has both a Theatre manager.  I put the [employer] info in the chart note.

## 2023-02-21 NOTE — Assessment & Plan Note (Signed)
Assessment: Patient with ASCVD not at LDL goal of < 55 Most recent LDL 172 on 11/13/22 Not able to tolerate statins secondary to myalgias - pitavastatin, rosuvastatin Reviewed options for lowering LDL cholesterol, including ezetimibe, PCSK-9 inhibitors, bempedoic acid and inclisiran.  Discussed mechanisms of action, dosing, side effects, potential decreases in LDL cholesterol and costs.  Also reviewed potential options for patient assistance.  Plan: Patient agreeable to starting Repatha 140 mg q14d Repeat labs after:  3 months Lipid Liver function Patient was given information on [employer] - will sign him up should price be prohibitive. (He has Medicare and retirement plans)

## 2023-02-23 DIAGNOSIS — J449 Chronic obstructive pulmonary disease, unspecified: Secondary | ICD-10-CM | POA: Insufficient documentation

## 2023-02-24 MED ORDER — REPATHA SURECLICK 140 MG/ML ~~LOC~~ SOAJ: 140.0000 mg | SUBCUTANEOUS | 3 refills | Status: DC

## 2023-02-24 NOTE — Telephone Encounter (Signed)
Repatha approved to 01/2024, rx sent to [facility].  Patient aware to reach out to office if cost prohibitive.   Repeat labs in 2 months

## 2023-03-06 DIAGNOSIS — F418 Other specified anxiety disorders: Secondary | ICD-10-CM | POA: Insufficient documentation

## 2023-03-10 ENCOUNTER — Telehealth: Payer: Self-pay | Admitting: Internal Medicine

## 2023-03-10 NOTE — Telephone Encounter (Signed)
Patient is calling stating he is needing a work note allowing him to go back to work. Please advise.

## 2023-03-10 NOTE — Telephone Encounter (Signed)
Left message to return call 

## 2023-03-13 ENCOUNTER — Ambulatory Visit: Payer: BC Managed Care – PPO | Admitting: Internal Medicine

## 2023-03-13 ENCOUNTER — Ambulatory Visit: Payer: BC Managed Care – PPO | Attending: Internal Medicine | Admitting: Internal Medicine

## 2023-03-13 ENCOUNTER — Encounter: Payer: Self-pay | Admitting: Internal Medicine

## 2023-03-13 VITALS — BP 134/80 | HR 65 | Ht 68.0 in | Wt 256.6 lb

## 2023-03-13 DIAGNOSIS — R634 Abnormal weight loss: Secondary | ICD-10-CM

## 2023-03-13 DIAGNOSIS — R0609 Other forms of dyspnea: Secondary | ICD-10-CM

## 2023-03-13 MED ORDER — SACUBITRIL-VALSARTAN 97-103 MG PO TABS: 1.0000 | ORAL_TABLET | Freq: Two times a day (BID) | ORAL | 2 refills | Status: DC

## 2023-03-13 NOTE — Progress Notes (Signed)
Erroneous encounter - please disregard.
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
## 2023-03-13 NOTE — Telephone Encounter (Signed)
Patient seen in clinic today by provider.

## 2023-03-13 NOTE — Patient Instructions (Addendum)
Medication Instructions:  Your physician has recommended you make the following change in your medication:  Increased Entresto 97-103 twice a day Continue taking all other medications as prescibed  Labwork: None  Testing/Procedures: Your physician has requested that you have an echocardiogram in 3 months. Echocardiography is a painless test that uses sound waves to create images of your heart. It provides your doctor with information about the size and shape of your heart and how well your heart's chambers and valves are working. This procedure takes approximately one hour. There are no restrictions for this procedure. Please do NOT wear cologne, perfume, aftershave, or lotions (deodorant is allowed). Please arrive 15 minutes prior to your appointment time.   Follow-Up: Your physician recommends that you schedule a follow-up appointment in: 4 months  Any Other Special Instructions Will Be Listed Below (If Applicable).  Referral to Pharm D  If you need a refill on your cardiac medications before your next appointment, please call your pharmacy.

## 2023-03-27 DIAGNOSIS — R159 Full incontinence of feces: Secondary | ICD-10-CM | POA: Insufficient documentation

## 2023-04-17 ENCOUNTER — Ambulatory Visit: Payer: BC Managed Care – PPO

## 2023-04-30 ENCOUNTER — Other Ambulatory Visit: Payer: Self-pay | Admitting: Pharmacist Clinician (PhC)/ Clinical Pharmacy Specialist

## 2023-04-30 DIAGNOSIS — E785 Hyperlipidemia, unspecified: Secondary | ICD-10-CM

## 2023-05-27 ENCOUNTER — Ambulatory Visit: Payer: BC Managed Care – PPO | Attending: Internal Medicine

## 2023-05-27 ENCOUNTER — Other Ambulatory Visit (HOSPITAL_COMMUNITY): Payer: BC Managed Care – PPO

## 2023-05-27 DIAGNOSIS — R0609 Other forms of dyspnea: Secondary | ICD-10-CM

## 2023-05-27 LAB — ECHOCARDIOGRAM COMPLETE
AR max vel: 3.2 cm2
AV Area VTI: 3.36 cm2
AV Area mean vel: 2.98 cm2
AV Mean grad: 4 mmHg
AV Peak grad: 7.3 mmHg
Ao pk vel: 1.35 m/s
Area-P 1/2: 4.65 cm2
MV VTI: 3.05 cm2
S' Lateral: 3.2 cm

## 2023-05-27 MED ORDER — PERFLUTREN LIPID MICROSPHERE
1.0000 mL | INTRAVENOUS | Status: AC | PRN
Start: 2023-05-27 — End: 2023-05-27
  Administered 2023-05-27: 10 mL via INTRAVENOUS

## 2023-05-29 ENCOUNTER — Ambulatory Visit (HOSPITAL_COMMUNITY): Payer: BC Managed Care – PPO

## 2023-06-09 ENCOUNTER — Telehealth: Payer: Self-pay

## 2023-06-09 ENCOUNTER — Telehealth: Payer: Self-pay | Admitting: Internal Medicine

## 2023-06-09 DIAGNOSIS — I447 Left bundle-branch block, unspecified: Secondary | ICD-10-CM

## 2023-06-09 NOTE — Telephone Encounter (Signed)
-----   Message from Vishnu P Mallipeddi sent at 05/27/2023  4:41 PM EDT ----- Heart pumping function has slightly improved from 25 to 30% to 30 to 35% now.  Continue current medications.  He will benefit from CRT-D due to LBBB and this might improve his heart pumping function. I will discuss with the patient in the next office visit in 06/2023.  In the meantime, please place electrophysiology referral to evaluate for CRT-D candidacy.

## 2023-06-09 NOTE — Telephone Encounter (Signed)
Patient informed and verbalized understanding

## 2023-06-09 NOTE — Telephone Encounter (Signed)
Pt returning call in regards to Echo results. Please advise

## 2023-06-09 NOTE — Telephone Encounter (Signed)
Called patient with results and placed referral. Patient Verbalized understanding. Note placed in result note

## 2023-06-20 ENCOUNTER — Telehealth: Payer: Self-pay | Admitting: Pharmacist Clinician (PhC)/ Clinical Pharmacy Specialist

## 2023-06-20 NOTE — Telephone Encounter (Signed)
Lab order mailed.

## 2023-06-24 ENCOUNTER — Ambulatory Visit: Payer: BC Managed Care – PPO | Attending: Internal Medicine | Admitting: Internal Medicine

## 2023-06-24 ENCOUNTER — Encounter: Payer: Self-pay | Admitting: Internal Medicine

## 2023-06-24 VITALS — BP 122/78 | HR 60 | Ht 68.0 in | Wt 262.8 lb

## 2023-06-24 DIAGNOSIS — R634 Abnormal weight loss: Secondary | ICD-10-CM

## 2023-06-24 DIAGNOSIS — E785 Hyperlipidemia, unspecified: Secondary | ICD-10-CM

## 2023-06-24 DIAGNOSIS — Z79899 Other long term (current) drug therapy: Secondary | ICD-10-CM | POA: Diagnosis not present

## 2023-06-24 MED ORDER — FUROSEMIDE 80 MG PO TABS: 120.0000 mg | ORAL_TABLET | Freq: Every day | ORAL | 2 refills | Status: DC

## 2023-06-24 NOTE — Patient Instructions (Addendum)
Medication Instructions:  Your physician has recommended you make the following change in your medication:  Increase Lasix from 80 mg to 120 mg after work (on the weekends take 80 mg twice a day) Continue taking all other medications as prescribed  Labwork: BNP today and BMET in a week at [facility]  Testing/Procedures: None  Follow-Up: Your physician recommends that you schedule a follow-up appointment in: 3 months  Any Other Special Instructions Will Be Listed Below (If Applicable). Referral to Pharm D  Thank you for choosing [facility]!     If you need a refill on your cardiac medications before your next appointment, please call your pharmacy.
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
## 2023-06-27 ENCOUNTER — Telehealth: Payer: Self-pay

## 2023-06-27 DIAGNOSIS — E785 Hyperlipidemia, unspecified: Secondary | ICD-10-CM

## 2023-06-27 DIAGNOSIS — Z79899 Other long term (current) drug therapy: Secondary | ICD-10-CM

## 2023-06-27 NOTE — Telephone Encounter (Signed)
Patient informed and verbalized understanding of plan. 

## 2023-06-27 NOTE — Telephone Encounter (Signed)
-----   Message from Vishnu P Mallipeddi sent at 06/26/2023  4:22 PM EDT ----- BNP moderately elevated. Will need to be compliant with lasix regimen to get the fluid out. Repeat BNP in one month.

## 2023-07-01 DIAGNOSIS — M25552 Pain in left hip: Secondary | ICD-10-CM | POA: Insufficient documentation

## 2023-07-04 ENCOUNTER — Ambulatory Visit: Payer: BC Managed Care – PPO | Attending: Cardiology

## 2023-07-04 DIAGNOSIS — Z87442 Personal history of urinary calculi: Secondary | ICD-10-CM | POA: Insufficient documentation

## 2023-07-04 DIAGNOSIS — E559 Vitamin D deficiency, unspecified: Secondary | ICD-10-CM | POA: Insufficient documentation

## 2023-07-04 DIAGNOSIS — E860 Dehydration: Secondary | ICD-10-CM | POA: Insufficient documentation

## 2023-07-04 DIAGNOSIS — E8881 Metabolic syndrome: Secondary | ICD-10-CM | POA: Insufficient documentation

## 2023-07-04 DIAGNOSIS — R001 Bradycardia, unspecified: Secondary | ICD-10-CM | POA: Insufficient documentation

## 2023-07-04 DIAGNOSIS — R635 Abnormal weight gain: Secondary | ICD-10-CM | POA: Insufficient documentation

## 2023-07-04 NOTE — Progress Notes (Deleted)
Patient ID: Noah Hampton                 DOB: 1951/04/10                    MRN: [id number]     HPI: Noah Hampton is a 72 y.o. male patient referred to pharmacy clinic by Dr Noah Hampton to initiate GLP1-RA therapy. PMH is significant for ***, and obesity. Most recent BMI ***.  Baseline weight and BMI: *** Current weight and BMI: *** Current meds that affect weight: ****  *** If diabetic and on insulin/sulfonylurea, can consider reducing dose to reduce risk of hypoglycemia  *** Follow-up visit  Assess % weight loss Assess adverse effects Missed doses  Diet:   Exercise:   Family History:   Social History:   Labs: Lab Results  Component Value Date   HGBA1C 6.9 (H) 07/15/2022    Wt Readings from Last 1 Encounters:  06/24/23 262 lb 12.8 oz (119.2 kg)    BP Readings from Last 1 Encounters:  06/24/23 122/78   Pulse Readings from Last 1 Encounters:  06/24/23 60       Component Value Date/Time   CHOL 251 (H) 11/13/2022 0425   TRIG 206 (H) 11/13/2022 0425   HDL 38 (L) 11/13/2022 0425   CHOLHDL 6.6 11/13/2022 0425   VLDL 41 (H) 11/13/2022 0425   LDLCALC 172 (H) 11/13/2022 0425    Past Medical History:  Diagnosis Date   Anxiety    Arthritis    Depression    Diabetes mellitus without complication (HCC)    Dyslipidemia    GERD (gastroesophageal reflux disease)    Gout    history of after surgery 02/2022   History of kidney stones    Hypertension    Obesity    BMI 41   Pneumonia    Sleep apnea    on C-pap    Current Outpatient Medications on File Prior to Visit  Medication Sig Dispense Refill   acetaminophen (TYLENOL) 500 MG tablet Take 500 mg by mouth every 6 (six) hours as needed.     apixaban (ELIQUIS) 5 MG TABS tablet Take 1 tablet (5 mg total) by mouth 2 (two) times daily. 180 tablet 1   aspirin EC 81 MG tablet Take 81 mg by mouth daily. Swallow whole.     cariprazine (VRAYLAR) 1.5 MG capsule Take 1.5 mg by mouth daily. (Patient not taking: Reported on  06/24/2023)     carvedilol (COREG) 6.25 MG tablet Take 1 tablet (6.25 mg total) by mouth 2 (two) times daily with a meal. 60 tablet 1   Cholecalciferol (VITAMIN D3) 125 MCG (5000 UT) CAPS Take 1 capsule by mouth daily.     clonazePAM (KLONOPIN) 0.5 MG tablet Take 1 tablet twice a day by oral route as needed for 30 days.     colchicine 0.6 MG tablet Take 0.6 mg by mouth daily as needed (gout). (Patient not taking: Reported on 06/24/2023)     escitalopram (LEXAPRO) 10 MG tablet Take 10 mg by mouth daily.     Evolocumab (REPATHA SURECLICK) 140 MG/ML SOAJ Inject 140 mg into the skin every 14 (fourteen) days. 6 mL 3   FARXIGA 10 MG TABS tablet Take 10 mg by mouth daily.     furosemide (LASIX) 80 MG tablet Take 1.5 tablets (120 mg total) by mouth daily. 135 tablet 2   glimepiride (AMARYL) 2 MG tablet Take 2 mg by mouth daily with breakfast.  Melatonin 10 MG TABS Take by mouth.     Menthol-Camphor (TIGER BALM ARTHRITIS RUB EX) Apply 1 Application topically daily as needed (pain).     Naproxen Sodium (ALEVE PO) Take by mouth.     NON FORMULARY Pt uses a cpap nightly     potassium chloride SA (KLOR-CON M) 20 MEQ tablet Take 1 tablet (20 mEq total) by mouth daily. (Patient not taking: Reported on 06/24/2023) 90 tablet 1   sacubitril-valsartan (ENTRESTO) 97-103 MG Take 1 tablet by mouth 2 (two) times daily. 60 tablet 2   spironolactone (ALDACTONE) 25 MG tablet Take 1 tablet (25 mg total) by mouth daily. 90 tablet 1   No current facility-administered medications on file prior to visit.    Allergies  Allergen Reactions   Statins Other (See Comments)     Myalgias (Muscle Pain)   Alpha-Gal Other (See Comments)    Unknown Questionable history of alpha-gal per patient, has not had any issues with medications in the past as of 11/11/22   Other Other (See Comments)    Beef/ Unknown     Assessment/Plan:  1. Weight loss - Patient has not met goal of at least 5% of body weight loss with comprehensive  lifestyle modifications alone in the past 3-6 months. Pharmacotherapy is appropriate to pursue as augmentation. Will start ***. Confirmed patient not ***pregnant and no personal or family history of medullary thyroid carcinoma (MTC) or Multiple Endocrine Neoplasia syndrome type 2 (MEN 2). Injection technique reviewed at today's visit.  Advised patient on common side effects including nausea, diarrhea, dyspepsia, decreased appetite, and fatigue. Counseled patient on reducing meal size and how to titrate medication to minimize side effects. Counseled patient to call if intolerable side effects or if experiencing dehydration, abdominal pain, or dizziness. Patient will adhere to dietary modifications and will target at least 150 minutes of moderate intensity exercise weekly.   Follow up in 1 month via telephone for tolerability update and dose titration.

## 2023-07-18 ENCOUNTER — Encounter: Payer: Self-pay | Admitting: Internal Medicine

## 2023-07-21 ENCOUNTER — Ambulatory Visit: Payer: BC Managed Care – PPO | Attending: Cardiovascular Disease | Admitting: Cardiovascular Disease

## 2023-07-21 ENCOUNTER — Encounter: Payer: Self-pay | Admitting: Cardiovascular Disease

## 2023-07-21 VITALS — BP 130/90 | HR 74 | Ht 68.0 in | Wt 252.0 lb

## 2023-07-21 DIAGNOSIS — I447 Left bundle-branch block, unspecified: Secondary | ICD-10-CM | POA: Diagnosis not present

## 2023-07-21 DIAGNOSIS — I428 Other cardiomyopathies: Secondary | ICD-10-CM

## 2023-07-21 NOTE — Patient Instructions (Signed)
Medication Instructions:  Your physician recommends that you continue on your current medications as directed. Please refer to the Current Medication list given to you today. *If you need a refill on your cardiac medications before your next appointment, please call your pharmacy*   Lab Work: CBC and BMET today If you have labs (blood work) drawn today and your tests are completely normal, you will receive your results only by: MyChart Message (if you have MyChart) OR A paper copy in the mail If you have any lab test that is abnormal or we need to change your treatment, we will call you to review the results.   Testing/Procedures: BiV ICD implant Your physician has recommended that you have a defibrillator inserted. An implantable cardioverter defibrillator (ICD) is a small device that is placed in your chest or, in rare cases, your abdomen. This device uses electrical pulses or shocks to help control life-threatening, irregular heartbeats that could lead the heart to suddenly stop beating (sudden cardiac arrest). Leads are attached to the ICD that goes into your heart. This is done in the hospital and usually requires an overnight stay. Please see the instruction sheet given to you today for more information.    Follow-Up: At [facility], you and your health needs are our priority.  As part of our continuing mission to provide you with exceptional heart care, we have created designated Provider Care Teams.  These Care Teams include your primary Cardiologist (physician) and Advanced Practice Providers (APPs -  Physician Assistants and Nurse Practitioners) who all work together to provide you with the care you need, when you need it.  We recommend signing up for the patient portal called "MyChart".  Sign up information is provided on this After Visit Summary.  MyChart is used to connect with patients for Virtual Visits (Telemedicine).  Patients are able to view lab/test results,  encounter notes, upcoming appointments, etc.  Non-urgent messages can be sent to your provider as well.   To learn more about what you can do with MyChart, go to [email or web address].    Your next appointment:   We will schedule follow up after ICD implant  Provider:   York Pellant, MD

## 2023-07-21 NOTE — Progress Notes (Signed)
Electrophysiology Office Note:    Date:  07/21/2023   ID:  Noah Hampton, DOB 01-Nov-1950, MRN [id number]  PCP:  Amedeo Plenty, DO   [facility] Providers Cardiologist:  Marjo Bicker, MD     Referring MD: Marjo Bicker, MD   History of Present Illness:    Noah Hampton is a 72 y.o. 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     Discussed the use of AI scribe software for clinical note transcription with the patient, who gave verbal consent to proceed.   He was diagnosed with acute systolic heart failure in February 2024 the patient's ejection fraction was initially 25-30% and has remained below 35% despite medical management. The patient reports persistent fatigue and frequent urination, which is distressing and disruptive to his daily life. The patient's spouse also notes that the patient has been out of work for a long time due to his health condition, which has led to feelings of depression. The patient expresses eagerness to improve his quality of life and reduce his symptoms.     Today, he has fatigue, shortness of breath -- at baseline  EKGs/Labs/Other Studies Reviewed Today:     Echocardiogram:  TTE November 11, 2022 EF 25 to 30%.  Grade 1 diastolic dysfunction.  Left atrium moderately dilated.  TTE May 27, 2023 EF 30 to 35%.  Grade 1 diastolic dysfunction.  Small pericardial effusion.  Normal biatrial size.    Cardiac catherization  Angiogram 12/24/2022 Reviewed in Epic  EKG:   EKG Interpretation Date/Time:  Monday July 21 2023 14:24:54 EDT Ventricular Rate:  74 PR Interval:  270 QRS Duration:  146 QT Interval:  430 QTC Calculation: 477 R Axis:   187  Text Interpretation: Sinus rhythm with 1st degree A-V block Right superior axis deviation Non-specific intra-ventricular conduction block When compared with ECG of 09-Nov-2022 10:46, PREVIOUS ECG IS  PRESENT Confirmed by York Pellant [phone]) on 07/21/2023 2:34:33 PM     Physical Exam:    VS:  BP (!) 130/90 (BP Location: Right Arm, Patient Position: Sitting, Cuff Size: Large)   Pulse 74   Ht 5\' 8"  (1.727 m)   Wt 252 lb (114.3 kg)   SpO2 96%   BMI 38.32 kg/m     Wt Readings from Last 3 Encounters:  07/21/23 252 lb (114.3 kg)  06/24/23 262 lb 12.8 oz (119.2 kg)  03/13/23 256 lb 9.6 oz (116.4 kg)     GEN: Well nourished, well developed in no acute distress CARDIAC: RRR, no murmurs, rubs, gallops RESPIRATORY:  Normal work of breathing MUSCULOSKELETAL: no edema    ASSESSMENT & PLAN:     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

## 2023-07-22 LAB — BASIC METABOLIC PANEL
BUN/Creatinine Ratio: 16 (ref 10–24)
BUN: 18 mg/dL (ref 8–27)
CO2: 23 mmol/L (ref 20–29)
Calcium: 9.7 mg/dL (ref 8.6–10.2)
Chloride: 101 mmol/L (ref 96–106)
Creatinine, Ser: 1.15 mg/dL (ref 0.76–1.27)
Glucose: 127 mg/dL — ABNORMAL HIGH (ref 70–99)
Potassium: 5 mmol/L (ref 3.5–5.2)
Sodium: 140 mmol/L (ref 134–144)
eGFR: 67 mL/min/{1.73_m2} (ref >59)

## 2023-07-22 LAB — CBC
Hematocrit: 46.7 % (ref 37.5–51.0)
Hemoglobin: 14.7 g/dL (ref 13.0–17.7)
MCH: 30.1 pg (ref 26.6–33.0)
MCHC: 31.5 g/dL (ref 31.5–35.7)
MCV: 96 fL (ref 79–97)
Platelets: 136 10*3/uL — ABNORMAL LOW (ref 150–450)
RBC: 4.89 x10E6/uL (ref 4.14–5.80)
RDW: 12.8 % (ref 11.6–15.4)
WBC: 7.4 10*3/uL (ref 3.4–10.8)

## 2023-07-31 ENCOUNTER — Telehealth: Payer: Self-pay | Admitting: Internal Medicine

## 2023-07-31 NOTE — Telephone Encounter (Signed)
Spoke w/ pt. 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 Heard somewhere you can't have both. Pt lacking knowledge pertaining to what and why CRTD being implanted. Is amenable to going over any questions w/ an APP prior to appointment in a telephone visit.

## 2023-07-31 NOTE — Telephone Encounter (Signed)
Patient called to talk with Dr. Jenene Slicker or nurse in regards to a couple of questions the patient has regarding his surgery next Thursday
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
## 2023-08-04 ENCOUNTER — Encounter: Payer: Self-pay | Admitting: Cardiology

## 2023-08-04 ENCOUNTER — Ambulatory Visit: Payer: BC Managed Care – PPO | Attending: Cardiology | Admitting: Cardiology

## 2023-08-04 ENCOUNTER — Telehealth: Payer: Self-pay | Admitting: Cardiology

## 2023-08-04 DIAGNOSIS — I447 Left bundle-branch block, unspecified: Secondary | ICD-10-CM | POA: Diagnosis not present

## 2023-08-04 DIAGNOSIS — I428 Other cardiomyopathies: Secondary | ICD-10-CM

## 2023-08-04 DIAGNOSIS — I5020 Unspecified systolic (congestive) heart failure: Secondary | ICD-10-CM

## 2023-08-04 NOTE — Telephone Encounter (Signed)
  Patient Consent for Virtual Visit        Noah Hampton has provided verbal consent on 08/04/2023 for a virtual visit (video or telephone).   CONSENT FOR VIRTUAL VISIT FOR:  Noah Hampton  By participating in this virtual visit I agree to the following:  I hereby voluntarily request, consent and authorize [facility] and its employed or contracted physicians, physician assistants, nurse practitioners or other licensed health care professionals (the Practitioner), to provide me with telemedicine health care services (the "Services") as deemed necessary by the treating Practitioner. I acknowledge and consent to receive the Services by the Practitioner via telemedicine. I understand that the telemedicine visit will involve communicating with the Practitioner through live audiovisual communication technology and the disclosure of certain medical information by electronic transmission. I acknowledge that I have been given the opportunity to request an in-person assessment or other available alternative prior to the telemedicine visit and am voluntarily participating in the telemedicine visit.  I understand that I have the right to withhold or withdraw my consent to the use of telemedicine in the course of my care at any time, without affecting my right to future care or treatment, and that the Practitioner or I may terminate the telemedicine visit at any time. I understand that I have the right to inspect all information obtained and/or recorded in the course of the telemedicine visit and may receive copies of available information for a reasonable fee.  I understand that some of the potential risks of receiving the Services via telemedicine include:  Delay or interruption in medical evaluation due to technological equipment failure or disruption; Information transmitted may not be sufficient (e.g. poor resolution of images) to allow for appropriate medical decision making by the Practitioner;  and/or  In rare instances, security protocols could fail, causing a breach of personal health information.  Furthermore, I acknowledge that it is my responsibility to provide information about my medical history, conditions and care that is complete and accurate to the best of my ability. I acknowledge that Practitioner's advice, recommendations, and/or decision may be based on factors not within their control, such as incomplete or inaccurate data provided by me or distortions of diagnostic images or specimens that may result from electronic transmissions. I understand that the practice of medicine is not an exact science and that Practitioner makes no warranties or guarantees regarding treatment outcomes. I acknowledge that a copy of this consent can be made available to me via my patient portal [facility] MyChart), or I can request a printed copy by calling the office of [facility].    I understand that my insurance will be billed for this visit.   I have read or had this consent read to me. I understand the contents of this consent, which adequately explains the benefits and risks of the Services being provided via telemedicine.  I have been provided ample opportunity to ask questions regarding this consent and the Services and have had my questions answered to my satisfaction. I give my informed consent for the services to be provided through the use of telemedicine in my medical care

## 2023-08-04 NOTE — Patient Instructions (Addendum)
Medication Instructions:   Your physician recommends that you continue on your current medications as directed. Please refer to the Current Medication list given to you today.  *If you need a refill on your cardiac medications before your next appointment, please call your pharmacy*   Lab Work:  NONE  If you have labs (blood work) drawn today and your tests are completely normal, you will receive your results only by: MyChart Message (if you have MyChart) OR A paper copy in the mail If you have any lab test that is abnormal or we need to change your treatment, we will call you to review the results.   Testing/Procedures:  NONE   Follow-Up: At [facility], you and your health needs are our priority.  As part of our continuing mission to provide you with exceptional heart care, we have created designated Provider Care Teams.  These Care Teams include your primary Cardiologist (physician) and Advanced Practice Providers (APPs -  Physician Assistants and Nurse Practitioners) who all work together to provide you with the care you need, when you need it.  We recommend signing up for the patient portal called "MyChart".  Sign up information is provided on this After Visit Summary.  MyChart is used to connect with patients for Virtual Visits (Telemedicine).  Patients are able to view lab/test results, encounter notes, upcoming appointments, etc.  Non-urgent messages can be sent to your provider as well.   To learn more about what you can do with MyChart, go to [email or web address].    Your next appointment:     In person after your procedure  Provider:   Sherie Don, NP

## 2023-08-06 NOTE — Pre-Procedure Instructions (Signed)
Instructed patient on the following items: Arrival time 1130 Nothing to eat or drink after midnight No meds AM of procedure Responsible person to drive you home and stay with you for 24 hrs Wash with special soap night before and morning of procedure If on anti-coagulant drug instructions Eliquis- last dose 11/11

## 2023-08-07 ENCOUNTER — Other Ambulatory Visit: Payer: Self-pay

## 2023-08-07 ENCOUNTER — Ambulatory Visit (HOSPITAL_COMMUNITY)
Admission: RE | Disposition: A | Discharge status: Home / Self Care | Payer: Self-pay | Source: Home / Self Care | Attending: Cardiovascular Disease

## 2023-08-07 ENCOUNTER — Ambulatory Visit (HOSPITAL_COMMUNITY)
Admission: RE | Admit: 2023-08-07 | Discharge: 2023-08-08 | Disposition: A | Discharge status: Home / Self Care | Payer: BC Managed Care – PPO | Attending: Cardiovascular Disease | Admitting: Cardiovascular Disease

## 2023-08-07 DIAGNOSIS — I447 Left bundle-branch block, unspecified: Secondary | ICD-10-CM | POA: Diagnosis not present

## 2023-08-07 DIAGNOSIS — G4733 Obstructive sleep apnea (adult) (pediatric): Secondary | ICD-10-CM | POA: Insufficient documentation

## 2023-08-07 DIAGNOSIS — E119 Type 2 diabetes mellitus without complications: Secondary | ICD-10-CM | POA: Insufficient documentation

## 2023-08-07 DIAGNOSIS — Z7901 Long term (current) use of anticoagulants: Secondary | ICD-10-CM | POA: Insufficient documentation

## 2023-08-07 DIAGNOSIS — I428 Other cardiomyopathies: Secondary | ICD-10-CM | POA: Insufficient documentation

## 2023-08-07 DIAGNOSIS — I509 Heart failure, unspecified: Secondary | ICD-10-CM

## 2023-08-07 DIAGNOSIS — I5022 Chronic systolic (congestive) heart failure: Secondary | ICD-10-CM

## 2023-08-07 DIAGNOSIS — Z86718 Personal history of other venous thrombosis and embolism: Secondary | ICD-10-CM | POA: Insufficient documentation

## 2023-08-07 DIAGNOSIS — I110 Hypertensive heart disease with heart failure: Secondary | ICD-10-CM | POA: Diagnosis present

## 2023-08-07 DIAGNOSIS — Z79899 Other long term (current) drug therapy: Secondary | ICD-10-CM | POA: Diagnosis not present

## 2023-08-07 HISTORY — PX: BIV ICD INSERTION CRT-D: EP1195

## 2023-08-07 SURGERY — BIV ICD INSERTION CRT-D

## 2023-08-07 MED ORDER — EVOLOCUMAB 140 MG/ML ~~LOC~~ SOAJ: 140.0000 mg | SUBCUTANEOUS | Status: DC

## 2023-08-07 MED ORDER — POVIDONE-IODINE 10 % EX SWAB
2.0000 | Freq: Once | CUTANEOUS | Status: AC
Start: 2023-08-07 — End: 2023-08-07
  Administered 2023-08-07: 2 via TOPICAL

## 2023-08-07 MED ORDER — CARVEDILOL 6.25 MG PO TABS
6.2500 mg | ORAL_TABLET | Freq: Two times a day (BID) | ORAL | Status: DC
  Administered 2023-08-07 – 2023-08-08 (×2): 6.25 mg via ORAL
  Filled 2023-08-07 (×2): qty 1

## 2023-08-07 MED ORDER — SACUBITRIL-VALSARTAN 97-103 MG PO TABS
1.0000 | ORAL_TABLET | Freq: Two times a day (BID) | ORAL | Status: DC
Start: 2023-08-07 — End: 2023-08-08
  Administered 2023-08-07 – 2023-08-08 (×2): 1 via ORAL
  Filled 2023-08-07 (×2): qty 1

## 2023-08-07 MED ORDER — SODIUM CHLORIDE 0.9 % IV SOLN
INTRAVENOUS | Status: AC
  Filled 2023-08-07: qty 2

## 2023-08-07 MED ORDER — SEMAGLUTIDE(0.25 OR 0.5MG/DOS) 2 MG/3ML ~~LOC~~ SOPN: 0.2500 mg | PEN_INJECTOR | SUBCUTANEOUS | Status: DC

## 2023-08-07 MED ORDER — SPIRONOLACTONE 25 MG PO TABS
25.0000 mg | ORAL_TABLET | Freq: Every day | ORAL | Status: DC
  Administered 2023-08-07 – 2023-08-08 (×2): 25 mg via ORAL
  Filled 2023-08-07 (×2): qty 1

## 2023-08-07 MED ORDER — FUROSEMIDE 40 MG PO TABS
120.0000 mg | ORAL_TABLET | Freq: Every day | ORAL | Status: DC
  Administered 2023-08-07: 120 mg via ORAL
  Filled 2023-08-07 (×2): qty 3

## 2023-08-07 MED ORDER — CEFAZOLIN SODIUM-DEXTROSE 1-4 GM/50ML-% IV SOLN
1.0000 g | Freq: Four times a day (QID) | INTRAVENOUS | Status: AC
  Administered 2023-08-07 – 2023-08-08 (×3): 1 g via INTRAVENOUS
  Filled 2023-08-07 (×4): qty 50

## 2023-08-07 MED ORDER — CLONAZEPAM 0.5 MG PO TABS
0.5000 mg | ORAL_TABLET | Freq: Two times a day (BID) | ORAL | Status: DC
  Administered 2023-08-07 – 2023-08-08 (×2): 0.5 mg via ORAL
  Filled 2023-08-07 (×2): qty 1

## 2023-08-07 MED ORDER — DAPAGLIFLOZIN PROPANEDIOL 10 MG PO TABS
10.0000 mg | ORAL_TABLET | Freq: Every day | ORAL | Status: DC
  Administered 2023-08-07 – 2023-08-08 (×2): 10 mg via ORAL
  Filled 2023-08-07 (×2): qty 1

## 2023-08-07 MED ORDER — MIDAZOLAM HCL 5 MG/5ML IJ SOLN
INTRAMUSCULAR | Status: DC | PRN
  Administered 2023-08-07 (×4): 1 mg via INTRAVENOUS

## 2023-08-07 MED ORDER — HEPARIN (PORCINE) IN NACL 1000-0.9 UT/500ML-% IV SOLN
INTRAVENOUS | Status: DC | PRN
  Administered 2023-08-07: 500 mL

## 2023-08-07 MED ORDER — LIDOCAINE HCL 1 % IJ SOLN
INTRAMUSCULAR | Status: AC
  Filled 2023-08-07: qty 60

## 2023-08-07 MED ORDER — CEFAZOLIN SODIUM-DEXTROSE 2-4 GM/100ML-% IV SOLN
INTRAVENOUS | Status: AC
  Administered 2023-08-07: 2 g via INTRAVENOUS
  Filled 2023-08-07: qty 100

## 2023-08-07 MED ORDER — ESCITALOPRAM OXALATE 10 MG PO TABS
10.0000 mg | ORAL_TABLET | Freq: Every day | ORAL | Status: DC
  Administered 2023-08-07 – 2023-08-08 (×2): 10 mg via ORAL
  Filled 2023-08-07 (×2): qty 1

## 2023-08-07 MED ORDER — CEFAZOLIN SODIUM-DEXTROSE 2-4 GM/100ML-% IV SOLN: 2.0000 g | INTRAVENOUS | Status: AC

## 2023-08-07 MED ORDER — ACETAMINOPHEN 325 MG PO TABS
325.0000 mg | ORAL_TABLET | ORAL | Status: DC | PRN
  Administered 2023-08-08: 650 mg via ORAL
  Filled 2023-08-07: qty 2

## 2023-08-07 MED ORDER — SODIUM CHLORIDE 0.9 % IV SOLN
80.0000 mg | INTRAVENOUS | Status: AC
  Administered 2023-08-07: 80 mg

## 2023-08-07 MED ORDER — ONDANSETRON HCL 4 MG/2ML IJ SOLN
4.0000 mg | Freq: Four times a day (QID) | INTRAMUSCULAR | Status: DC | PRN
Start: 2023-08-07 — End: 2023-08-08

## 2023-08-07 MED ORDER — ACETAMINOPHEN 500 MG PO TABS: 1000.0000 mg | ORAL_TABLET | Freq: Four times a day (QID) | ORAL | Status: DC | PRN

## 2023-08-07 MED ORDER — CHLORHEXIDINE GLUCONATE 4 % EX SOLN: 4.0000 | Freq: Once | CUTANEOUS | Status: DC

## 2023-08-07 MED ORDER — IOHEXOL 350 MG/ML SOLN
INTRAVENOUS | Status: DC | PRN
  Administered 2023-08-07: 12 mL

## 2023-08-07 MED ORDER — SODIUM CHLORIDE 0.9 % IV SOLN: INTRAVENOUS | Status: DC

## 2023-08-07 MED ORDER — NAPROXEN 250 MG PO TABS: 500.0000 mg | ORAL_TABLET | Freq: Every day | ORAL | Status: DC | PRN

## 2023-08-07 MED ORDER — MIDAZOLAM HCL 5 MG/5ML IJ SOLN
INTRAMUSCULAR | Status: AC
  Filled 2023-08-07: qty 5

## 2023-08-07 MED ORDER — FENTANYL CITRATE (PF) 100 MCG/2ML IJ SOLN
INTRAMUSCULAR | Status: DC | PRN
  Administered 2023-08-07 (×4): 25 ug via INTRAVENOUS

## 2023-08-07 MED ORDER — POTASSIUM CHLORIDE CRYS ER 20 MEQ PO TBCR
20.0000 meq | EXTENDED_RELEASE_TABLET | Freq: Every day | ORAL | Status: DC
  Administered 2023-08-08: 20 meq via ORAL
  Filled 2023-08-07: qty 1

## 2023-08-07 MED ORDER — ASPIRIN 81 MG PO TBEC
81.0000 mg | DELAYED_RELEASE_TABLET | Freq: Every day | ORAL | Status: DC
  Administered 2023-08-07 – 2023-08-08 (×2): 81 mg via ORAL
  Filled 2023-08-07 (×2): qty 1

## 2023-08-07 MED ORDER — FENTANYL CITRATE (PF) 100 MCG/2ML IJ SOLN
INTRAMUSCULAR | Status: AC
  Filled 2023-08-07: qty 2

## 2023-08-07 SURGICAL SUPPLY — 19 items
BALLN COR SINUS VENO 6FR 80 (BALLOONS) ×1
BALLOON COR SINUS VENO 6FR 80 (BALLOONS) IMPLANT
CABLE SURGICAL S-101-97-12 (CABLE) ×1 IMPLANT
CATH CPS DIRECT 135 DS2C020 (CATHETERS) IMPLANT
CATH CPS DIRECT XW DS2C029 (CATHETERS) IMPLANT
GUIDEWIRE ANGLED .035X150CM (WIRE) IMPLANT
ICD GALLANT HFCRTD CDHFA500Q (ICD Generator) IMPLANT
LEAD DURATA 7122Q-65CM (Lead) IMPLANT
LEAD QUARTET 1458QL-86 (Lead) IMPLANT
LEAD ULTIPACE 52 LPA1231/52 (Lead) IMPLANT
MAT PREVALON FULL STRYKER (MISCELLANEOUS) IMPLANT
PAD DEFIB RADIO PHYSIO CONN (PAD) ×1 IMPLANT
QUARTET 1458QL-86 (Lead) ×1 IMPLANT
SHEATH 7FR PRELUDE SNAP 13 (SHEATH) IMPLANT
SHEATH 9.5FR PRELUDE SNAP 13 (SHEATH) IMPLANT
SLITTER AGILIS HISPRO (INSTRUMENTS) IMPLANT
TRAY PACEMAKER INSERTION (PACKS) ×1 IMPLANT
WIRE ACUITY WHISPER EDS 4648 (WIRE) IMPLANT
WIRE HI TORQ VERSACORE-J 145CM (WIRE) IMPLANT

## 2023-08-07 NOTE — Interval H&P Note (Signed)
History and Physical Interval Note:  08/07/2023 11:37 AM  [name]  has presented today for surgery, with the diagnosis of cardiomyopathy.  The various methods of treatment have been discussed with the patient and family. After consideration of risks, benefits and other options for treatment, the patient has consented to  Procedure(s): BIV ICD INSERTION CRT-D (N/A) as a surgical intervention.  The patient's history has been reviewed, patient examined, no change in status, stable for surgery.  I have reviewed the patient's chart and labs.  Questions were answered to the patient's satisfaction.     Roberts Gaudy Claude Waldman

## 2023-08-07 NOTE — Progress Notes (Signed)
Orthopedic Tech Progress Note Patient Details:  Noah Hampton [birth year] [id number] Pt has arm sling  Patient ID: Noah Hampton, male   DOB: [birth year], 72 y.o.   MRN: [id number]  Noah Hampton 08/07/2023, 7:48 PM

## 2023-08-08 ENCOUNTER — Ambulatory Visit (HOSPITAL_COMMUNITY): Payer: BC Managed Care – PPO

## 2023-08-08 ENCOUNTER — Encounter (HOSPITAL_COMMUNITY): Payer: Self-pay | Admitting: Cardiovascular Disease

## 2023-08-08 DIAGNOSIS — I447 Left bundle-branch block, unspecified: Secondary | ICD-10-CM | POA: Diagnosis not present

## 2023-08-08 DIAGNOSIS — I110 Hypertensive heart disease with heart failure: Secondary | ICD-10-CM | POA: Diagnosis not present

## 2023-08-08 DIAGNOSIS — I5022 Chronic systolic (congestive) heart failure: Secondary | ICD-10-CM | POA: Diagnosis not present

## 2023-08-08 DIAGNOSIS — I428 Other cardiomyopathies: Secondary | ICD-10-CM | POA: Diagnosis not present

## 2023-08-08 MED ORDER — APIXABAN 5 MG PO TABS: 5.0000 mg | ORAL_TABLET | Freq: Two times a day (BID) | ORAL | Status: DC

## 2023-08-08 MED FILL — Lidocaine HCl Local Preservative Free (PF) Inj 1%: INTRAMUSCULAR | Qty: 30 | Status: AC

## 2023-08-08 NOTE — Discharge Summary (Signed)
ELECTROPHYSIOLOGY PROCEDURE DISCHARGE SUMMARY    Patient ID: Noah Hampton,  MRN: [id number], DOB/AGE: 11/07/1950 72 y.o.  Admit date: 08/07/2023 Discharge date: 08/08/2023  Primary Care Physician: Amedeo Plenty, DO  Primary Cardiologist: Marjo Bicker, MD  Electrophysiologist: [name]    Primary Diagnosis:  Chronic systolic CHF  NICM  Secondary Diagnosis: LBBB H/o DVT 10/2022  Allergies  Allergen Reactions   Statins Other (See Comments)     Myalgias (Muscle Pain)   Alpha-Gal Other (See Comments)    Unknown Questionable history of alpha-gal per patient, has not had any issues with medications in the past as of 11/11/22     Procedures This Admission:  1.  Implantation of a Abbott BiV ICD on 08/07/2023 by [name].  The patient received a Abbot Gallant CDVHRA500Q (BiV)  with Abbott Ultipace 1231-52 right atrial lead and Abbott Durata 7122 right ventricular lead. Pt received a Abbott Quartet 1458Q LV lead. DFTs were deferred at time of implant There were no post procedure complications 2.  CXR on 11/15 demonstrated no pneumothorax status post device implantation.      Brief HPI: Noah Hampton is a 72 y.o. male was referred to electrophysiology in the outpatient setting  for consideration of ICD implantation.  Past medical history includes above.  The patient has persistent LV dysfunction despite guideline directed therapy.  Risks, benefits, and alternatives to ICD implantation were reviewed with the patient who wished to proceed.   Hospital Course:  The patient was admitted and underwent implantation of a Abbott BiV ICD with details as outlined above. They were monitored on telemetry overnight which demonstrated appropriate pacing .  Left chest was without hematoma or ecchymosis.  The device was interrogated and found to be functioning normally.  CXR was obtained and demonstrated no pneumothorax status post device implantation..  Wound care, arm mobility, and  restrictions were reviewed with the patient.  The patient was examined and considered stable for discharge to home.   The patient's discharge medications include an ACE-I/ARB/ARNI Sherryll Burger) and beta blocker (Coreg).   Anticoagulation resumption This patient should resume their Eliquis on Tuesday, 11/19 with the evening dose.   Physical Exam: Vitals:   08/07/23 1905 08/07/23 2009 08/07/23 2321 08/08/23 0604  BP:  (!) 156/77 (!) 147/86 132/81  Pulse:  86 81 81  Resp: 18 20 20 18   Temp: 97.6 F (36.4 C) 98.6 F (37 C) 98.2 F (36.8 C) 97.9 F (36.6 C)  TempSrc: Oral Oral Oral Oral  SpO2:  91% 92% 90%  Weight:      Height:        GEN- NAD. A&O x 3.  HEENT: Normocephalic, atraumatic Lungs- CTAB, normal effort.  Heart- RRR. No M/G/R.  GI- Soft, NT, ND.  Extremities- No clubbing, cyanosis, or edema Skin- Warm and dry, no rash or lesion. ICD site stable.   Discharge Medications:  Allergies as of 08/08/2023       Reactions   Statins Other (See Comments)    Myalgias (Muscle Pain)   Alpha-gal Other (See Comments)   Unknown Questionable history of alpha-gal per patient, has not had any issues with medications in the past as of 11/11/22        Medication List     TAKE these medications    acetaminophen 500 MG tablet Commonly known as: TYLENOL Take 1,000 mg by mouth every 6 (six) hours as needed for moderate pain (pain score 4-6).   apixaban 5 MG Tabs tablet  Commonly known as: ELIQUIS Take 1 tablet (5 mg total) by mouth 2 (two) times daily. Resume Tuesday, 11/19 with the evening dose Start taking on: August 12, 2023 What changed:  additional instructions These instructions start on August 12, 2023. If you are unsure what to do until then, ask your doctor or other care provider.   aspirin EC 81 MG tablet Take 81 mg by mouth daily. Swallow whole.   carvedilol 6.25 MG tablet Commonly known as: COREG Take 1 tablet (6.25 mg total) by mouth 2 (two) times daily with  a meal.   clonazePAM 0.5 MG tablet Commonly known as: KLONOPIN Take 0.5 mg by mouth 2 (two) times daily.   escitalopram 10 MG tablet Commonly known as: LEXAPRO Take 10 mg by mouth daily.   Farxiga 10 MG Tabs tablet Generic drug: dapagliflozin propanediol Take 10 mg by mouth daily.   furosemide 80 MG tablet Commonly known as: Lasix Take 1.5 tablets (120 mg total) by mouth daily.   naproxen sodium 220 MG tablet Commonly known as: ALEVE Take 440 mg by mouth daily as needed (Pain).   NON FORMULARY Pt uses a cpap nightly   Ozempic (0.25 or 0.5 MG/DOSE) 2 MG/3ML Sopn Generic drug: Semaglutide(0.25 or 0.5MG /DOS) Inject 0.25 mg as directed once a week.   potassium chloride SA 20 MEQ tablet Commonly known as: KLOR-CON M Take 1 tablet (20 mEq total) by mouth daily.   Repatha SureClick 140 MG/ML Soaj Generic drug: Evolocumab Inject 140 mg into the skin every 14 (fourteen) days.   sacubitril-valsartan 97-103 MG Commonly known as: ENTRESTO Take 1 tablet by mouth 2 (two) times daily.   spironolactone 25 MG tablet Commonly known as: ALDACTONE Take 1 tablet (25 mg total) by mouth daily.   TIGER BALM ARTHRITIS RUB EX Apply 1 Application topically daily as needed (pain).        Disposition: Home with usual follow up as in AVS  Duration of Discharge Encounter: Greater than 30 minutes including physician time.  Dustin Flock, PA-C  08/08/2023 7:53 AM

## 2023-08-08 NOTE — Progress Notes (Signed)
Patient's ICD was successfully interrogated this morning but the devise need to be paired to his phone. Patient should not be discharged prior to pairing. Sarah RN made aware of this need and was asked to relay this to medical team.

## 2023-08-08 NOTE — Plan of Care (Signed)
  Problem: Education: Goal: Knowledge of cardiac device and self-care will improve 08/08/2023 0942 by Collene Gobble, RN Outcome: Adequate for Discharge 08/08/2023 [phone] by Collene Gobble, RN Outcome: Progressing Goal: Ability to safely manage health related needs after discharge will improve 08/08/2023 0942 by Collene Gobble, RN Outcome: Adequate for Discharge 08/08/2023 [phone] by Collene Gobble, RN Outcome: Progressing Goal: Individualized Educational Video(s) 08/08/2023 0942 by Collene Gobble, RN Outcome: Adequate for Discharge 08/08/2023 [phone] by Collene Gobble, RN Outcome: Progressing   Problem: Cardiac: Goal: Ability to achieve and maintain adequate cardiopulmonary perfusion will improve 08/08/2023 0942 by Collene Gobble, RN Outcome: Adequate for Discharge 08/08/2023 [phone] by Collene Gobble, RN Outcome: Progressing   Problem: Education: Goal: Knowledge of General Education information will improve Description: Including pain rating scale, medication(s)/side effects and non-pharmacologic comfort measures 08/08/2023 0942 by Collene Gobble, RN Outcome: Adequate for Discharge 08/08/2023 [phone] by Collene Gobble, RN Outcome: Progressing   Problem: Health Behavior/Discharge Planning: Goal: Ability to manage health-related needs will improve 08/08/2023 0942 by Collene Gobble, RN Outcome: Adequate for Discharge 08/08/2023 [phone] by Collene Gobble, RN Outcome: Progressing   Problem: Clinical Measurements: Goal: Ability to maintain clinical measurements within normal limits will improve 08/08/2023 0942 by Collene Gobble, RN Outcome: Adequate for Discharge 08/08/2023 [phone] by Collene Gobble, RN Outcome: Progressing Goal: Will remain free from infection 08/08/2023 0942 by Collene Gobble, RN Outcome: Adequate for Discharge 08/08/2023 [phone] by Collene Gobble, RN Outcome: Progressing Goal: Diagnostic test results will improve 08/08/2023 0942 by Collene Gobble,  RN Outcome: Adequate for Discharge 08/08/2023 [phone] by Collene Gobble, RN Outcome: Progressing Goal: Respiratory complications will improve 08/08/2023 0942 by Collene Gobble, RN Outcome: Adequate for Discharge 08/08/2023 [phone] by Collene Gobble, RN Outcome: Progressing Goal: Cardiovascular complication will be avoided 08/08/2023 4034 by Collene Gobble, RN Outcome: Adequate for Discharge 08/08/2023 [phone] by Collene Gobble, RN Outcome: Progressing   Problem: Activity: Goal: Risk for activity intolerance will decrease 08/08/2023 0942 by Collene Gobble, RN Outcome: Adequate for Discharge 08/08/2023 [phone] by Collene Gobble, RN Outcome: Progressing   Problem: Nutrition: Goal: Adequate nutrition will be maintained 08/08/2023 0942 by Collene Gobble, RN Outcome: Adequate for Discharge 08/08/2023 [phone] by Collene Gobble, RN Outcome: Progressing   Problem: Coping: Goal: Level of anxiety will decrease 08/08/2023 0942 by Collene Gobble, RN Outcome: Adequate for Discharge 08/08/2023 [phone] by Collene Gobble, RN Outcome: Progressing   Problem: Elimination: Goal: Will not experience complications related to bowel motility 08/08/2023 0942 by Collene Gobble, RN Outcome: Adequate for Discharge 08/08/2023 [phone] by Collene Gobble, RN Outcome: Progressing Goal: Will not experience complications related to urinary retention 08/08/2023 0942 by Collene Gobble, RN Outcome: Adequate for Discharge 08/08/2023 [phone] by Collene Gobble, RN Outcome: Progressing   Problem: Pain Management: Goal: General experience of comfort will improve 08/08/2023 0942 by Collene Gobble, RN Outcome: Adequate for Discharge 08/08/2023 [phone] by Collene Gobble, RN Outcome: Progressing   Problem: Safety: Goal: Ability to remain free from injury will improve 08/08/2023 0942 by Collene Gobble, RN Outcome: Adequate for Discharge 08/08/2023 [phone] by Collene Gobble, RN Outcome: Progressing   Problem: Skin  Integrity: Goal: Risk for impaired skin integrity will decrease 08/08/2023 0942 by Collene Gobble, RN Outcome: Adequate for Discharge 08/08/2023 [phone] by Collene Gobble, RN Outcome: Progressing

## 2023-08-08 NOTE — TOC Initial Note (Signed)
Transition of Care [facility]) - Initial/Assessment Note    Patient Details  Name: Noah Hampton MRN: [id number] Date of Birth: 1950-10-04  Transition of Care [facility]) CM/SW Contact:    Leone Haven, RN Phone Number: 08/08/2023, 9:51 AM  Clinical Narrative:                 From home with spouse, has PCP and insurance on file, states has no HH services in place at this time, has home oxygen but does not use it unless needed.   States family member will transport them home at [employer] and family is support system, states gets medications from [facility] in [place].  Pta self ambulatory.   Expected Discharge Plan: Home/Self Care Barriers to Discharge: No Barriers Identified   Patient Goals and CMS Choice Patient states their goals for this hospitalization and ongoing recovery are:: return home   Choice offered to / list presented to : NA      Expected Discharge Plan and Services In-house Referral: NA Discharge Planning Services: CM Consult Post Acute Care Choice: NA Living arrangements for the past 2 months: Single Family Home Expected Discharge Date: 08/08/23               DME Arranged: N/A DME Agency: NA       HH Arranged: NA          Prior Living Arrangements/Services Living arrangements for the past 2 months: Single Family Home Lives with:: Spouse Patient language and need for interpreter reviewed:: Yes Do you feel safe going back to the place where you live?: Yes      Need for Family Participation in Patient Care: Yes (Comment) Care giver support system in place?: Yes (comment) Current home services: DME (home oxygen but do not use it) Criminal Activity/Legal Involvement Pertinent to Current Situation/Hospitalization: No - Comment as needed  Activities of Daily Living      Permission Sought/Granted Permission sought to share information with : Case Manager Permission granted to share information with : Yes, Verbal Permission Granted               Emotional Assessment   Attitude/Demeanor/Rapport: Engaged Affect (typically observed): Appropriate Orientation: : Oriented to Self, Oriented to Place, Oriented to  Time, Oriented to Situation Alcohol / Substance Use: Not Applicable Psych Involvement: No (comment)  Admission diagnosis:  CHF (congestive heart failure) (HCC) [I50.9] Patient Active Problem List   Diagnosis Date Noted   CHF (congestive heart failure) (HCC) 08/07/2023   Vitamin D deficiency 07/04/2023   Sinus bradycardia 07/04/2023   Metabolic syndrome X 07/04/2023   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   History of urinary stone 07/04/2023   Abnormal weight gain 07/04/2023   Pain of left hip joint 07/01/2023   Incontinence of feces 03/27/2023   ERRONEOUS ENCOUNTER--DISREGARD 03/13/2023   Mixed anxiety and depressive disorder 03/06/2023   Chronic obstructive pulmonary disease (HCC) 02/23/2023   Nonischemic cardiomyopathy (HCC) 01/31/2023   Rash 12/31/2022   Agatston coronary artery calcium score greater than 400 12/31/2022   Physical deconditioning 11/23/2022   Pneumonia due to infectious organism 11/15/2022   Other hyperlipidemia 11/13/2022   Acute deep vein thrombosis (DVT) of proximal vein of both lower extremities (HCC) 11/13/2022   Acute systolic heart failure (HCC) 11/12/2022   Acute idiopathic gout of right foot 11/12/2022   Acute hypoxic respiratory failure (HCC) 11/12/2022   Myocardial injury 11/12/2022   Acute exacerbation of CHF (congestive heart failure) (HCC) 11/09/2022   Uncontrolled type 2 diabetes  mellitus with hyperglycemia, without long-term current use of insulin (HCC) 11/09/2022   Pneumonitis 11/09/2022   Urinary incontinence 09/29/2022   Nasal congestion 09/29/2022   S/P total knee arthroplasty, 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   Hypertensive urgency 06/08/2022   Dyspnea on exertion 05/30/2022   Insulin resistance 05/08/2022   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/2022   Pulmonary  embolism (HCC) 04/25/2022   Pain in lower limb 03/28/2022   S/P total knee arthroplasty, left 03/19/2022   Low back pain 08/26/2021   Pain in right foot 01/27/2021   Non-alcoholic fatty liver disease 10/24/2020   History of pancreatitis 10/24/2020   Syncope and collapse 10/22/2020   Recurrent major depressive episodes, moderate (HCC) 10/22/2020   Increased frequency of urination 10/22/2020   Dizziness 06/20/2020   Pancreatitis 06/18/2020   Decreased testosterone level 02/10/2020   Right upper quadrant pain 08/03/2019   Osteoarthritis 11/13/2016   Hypoxia 11/13/2016   Chest pain with high risk of acute coronary syndrome 02/02/2016   Primary hypertension 02/02/2016   Type 2 diabetes mellitus, uncontrolled 02/02/2016   Dyslipidemia 02/02/2016   Morbid obesity (HCC) 02/02/2016   OSA (obstructive sleep apnea) 02/02/2016   Chest pain 02/02/2016   Atypical chest pain 02/02/2016   PCP:  Amedeo Plenty, DO Pharmacy:   [facility] [place], [place] - [address] [place] [address] Phone: [phone] Fax: [phone]     Social Determinants of Health (SDOH) Social History: SDOH Screenings   Food Insecurity: No Food Insecurity (11/11/2022)  Housing: Low Risk  (11/11/2022)  Transportation Needs: No Transportation Needs (11/11/2022)  Utilities: Not At Risk (11/11/2022)  Tobacco Use: Low Risk  (08/04/2023)   SDOH Interventions:     Readmission Risk Interventions     No data to display

## 2023-08-08 NOTE — TOC Transition Note (Addendum)
Transition of Care [facility]) - CM/SW Discharge Note   Patient Details  Name: Noah Hampton MRN: [id number] Date of Birth: 1950/11/18  [facility] CM/SW Contact:  Leone Haven, RN Phone Number: 08/08/2023, 9:53 AM   Clinical Narrative:    For dc , has transportation at dc. Will need to get iv abx prior to dc.    Final next level of care: Home/Self Care Barriers to Discharge: No Barriers Identified   Patient Goals and CMS Choice   Choice offered to / list presented to : NA  Discharge Placement                         Discharge Plan and Services Additional resources added to the After Visit Summary for   In-house Referral: NA Discharge Planning Services: CM Consult Post Acute Care Choice: NA          DME Arranged: N/A DME Agency: NA       HH Arranged: NA          Social Determinants of Health (SDOH) Interventions SDOH Screenings   Food Insecurity: No Food Insecurity (11/11/2022)  Housing: Low Risk  (11/11/2022)  Transportation Needs: No Transportation Needs (11/11/2022)  Utilities: Not At Risk (11/11/2022)  Tobacco Use: Low Risk  (08/04/2023)     Readmission Risk Interventions     No data to display

## 2023-08-08 NOTE — Discharge Instructions (Signed)
After Your ICD (Implantable Cardiac Defibrillator)   You have a [employer] ICD  ACTIVITY Do not lift your arm above shoulder height for 1 week after your procedure. After 7 days, you may progress as below.  You should remove your sling 24 hours after your procedure, unless otherwise instructed by your provider.     Friday August 15, 2023  Saturday August 16, 2023 Sunday August 17, 2023 Monday August 18, 2023   Do not lift, push, pull, or carry anything over 10 pounds with the affected arm until 6 weeks (Friday September 19, 2023 ) after your procedure.   You may drive AFTER your wound check, unless you have been told otherwise by your provider.   Ask your healthcare provider when you can go back to work   INCISION/Dressing If you are on a blood thinner such as Coumadin, Xarelto, Eliquis, Plavix, or Pradaxa please confirm with your provider when this should be resumed.   If large square, outer bandage is left in place, this can be removed after 24 hours from your procedure. Do not remove steri-strips or glue as below.   Monitor your defibrillator site for redness, swelling, and drainage. Call the device clinic at [phone] if you experience these symptoms or fever/chills.  If your incision is sealed with Steri-strips or staples, you may shower 7 days after your procedure or when told by your provider. Do not remove the steri-strips or let the shower hit directly on your site. You may wash around your site with soap and water.    If you were discharged in a sling, please do not wear this during the day more than 48 hours after your surgery unless otherwise instructed. This may increase the risk of stiffness and soreness in your shoulder.   Avoid lotions, ointments, or perfumes over your incision until it is well-healed.  You may use a hot tub or a pool AFTER your wound check appointment if the incision is completely closed.  Your ICD is designed to protect you from life  threatening heart rhythms. Because of this, you may receive a shock.   1 shock with no symptoms:  Call the office during business hours. 1 shock with symptoms (chest pain, chest pressure, dizziness, lightheadedness, shortness of breath, overall feeling unwell):  Call 911. If you experience 2 or more shocks in 24 hours:  Call 911. If you receive a shock, you should not drive for 6 months per the [place] DMV IF you receive appropriate therapy from your ICD.   ICD Alerts:  Some alerts are vibratory and others beep. These are NOT emergencies. Please call our office to let us know. If this occurs at night or on weekends, it can wait until the next business day. Send a remote transmission.  If your device is capable of reading fluid status (for heart failure), you will be offered monthly monitoring to review this with you.   DEVICE MANAGEMENT Remote monitoring is used to monitor your ICD from home. This monitoring is scheduled every 91 days by our office. It allows [place] to keep an eye on the functioning of your device to ensure it is working properly. You will routinely see your Electrophysiologist annually (more often if necessary).   You should receive your ID card for your new device in 4-8 weeks. Keep this card with you at all times once received. Consider wearing a medical alert bracelet or necklace.  Your ICD  may be MRI compatible. This will be discussed at your next  office visit/wound check.  You should avoid contact with strong electric or magnetic fields.   Do not use amateur (ham) radio equipment or electric (arc) welding torches. MP3 player headphones with magnets should not be used. Some devices are safe to use if held at least 12 inches (30 cm) from your defibrillator. These include power tools, lawn mowers, and speakers. If you are unsure if something is safe to use, ask your health care provider.  When using your cell phone, hold it to the ear that is on the opposite side from the  defibrillator. Do not leave your cell phone in a pocket over the defibrillator.  You may safely use electric blankets, heating pads, computers, and microwave ovens.  Call the office right away if: You have chest pain. You feel more than one shock. You feel more short of breath than you have felt before. You feel more light-headed than you have felt before. Your incision starts to open up.  This information is not intended to replace advice given to you by your health care provider. Make sure you discuss any questions you have with your health care provider.

## 2023-08-12 ENCOUNTER — Telehealth: Payer: Self-pay | Admitting: Internal Medicine

## 2023-08-12 MED ORDER — SACUBITRIL-VALSARTAN 97-103 MG PO TABS: 1.0000 | ORAL_TABLET | Freq: Two times a day (BID) | ORAL | 3 refills | Status: DC

## 2023-08-12 NOTE — Telephone Encounter (Signed)
Pt c/o medication issue:  1. Name of Medication: sacubitril-valsartan (ENTRESTO) 97-103 MG   2. How are you currently taking this medication (dosage and times per day)? As written  3. Are you having a reaction (difficulty breathing--STAT)? no  4. What is your medication issue?     Pt wants to take to a nurse about his sacubitril-valsartan (ENTRESTO) 97-103 MG  Per his insurance if he would have gotten a 90 day supply it would have been cheaper. I told him that since he already has the medication not sure that we can co anything about that.

## 2023-08-12 NOTE — Telephone Encounter (Signed)
Spoke to pt who stated he would like Entresto to be sent to pharmacy with a 90 day supply from this point forward as it only cost $5 for a 90 day supply compared to $27 for a 30 day supply. Advised pt that we would send in 90 days, but he may be unable to pick it up at this time as insurance will not cover medication if it has just been picked up from the pharmacy. Pt verbalized understanding. Pt had no questions or concerns at this time.

## 2023-08-15 ENCOUNTER — Telehealth: Payer: Self-pay | Admitting: Cardiovascular Disease

## 2023-08-15 NOTE — Telephone Encounter (Signed)
LMTCB

## 2023-08-15 NOTE — Telephone Encounter (Signed)
New Message;   Patient said he had his device put in last week.Patient says says he have some questions.

## 2023-08-18 ENCOUNTER — Telehealth: Payer: Self-pay | Admitting: Internal Medicine

## 2023-08-18 NOTE — Telephone Encounter (Signed)
Patient stated he had surgery on 11/14 and wants to know when he can take a shower and when he can drive.

## 2023-08-18 NOTE — Telephone Encounter (Signed)
Spoke with pt let him know not to shower or drive until after wound check appt. Pt verbalized understanding

## 2023-08-25 NOTE — Patient Instructions (Signed)

## 2023-08-27 ENCOUNTER — Ambulatory Visit: Payer: BC Managed Care – PPO | Attending: Internal Medicine

## 2023-08-27 DIAGNOSIS — I428 Other cardiomyopathies: Secondary | ICD-10-CM | POA: Diagnosis not present

## 2023-08-28 LAB — CUP PACEART INCLINIC DEVICE CHECK
Battery Remaining Longevity: 88 mo
Brady Statistic RA Percent Paced: 5.1 %
Brady Statistic RV Percent Paced: 99.87 %
Date Time Interrogation Session: 20241204161900
HighPow Impedance: 63 Ohm
Implantable Lead Connection Status: 753985
Implantable Lead Connection Status: 753985
Implantable Lead Connection Status: 753985
Implantable Lead Implant Date: 20241114
Implantable Lead Implant Date: 20241114
Implantable Lead Implant Date: 20241114
Implantable Lead Location: 753858
Implantable Lead Location: 753859
Implantable Lead Location: 753860
Implantable Pulse Generator Implant Date: 20241114
Lead Channel Impedance Value: 1100 Ohm
Lead Channel Impedance Value: 375 Ohm
Lead Channel Impedance Value: 475 Ohm
Lead Channel Pacing Threshold Amplitude: 0.75 V
Lead Channel Pacing Threshold Amplitude: 0.75 V
Lead Channel Pacing Threshold Amplitude: 1 V
Lead Channel Pacing Threshold Amplitude: 1 V
Lead Channel Pacing Threshold Amplitude: 1.25 V
Lead Channel Pacing Threshold Amplitude: 1.25 V
Lead Channel Pacing Threshold Pulse Width: 0.5 ms
Lead Channel Pacing Threshold Pulse Width: 0.5 ms
Lead Channel Pacing Threshold Pulse Width: 0.5 ms
Lead Channel Pacing Threshold Pulse Width: 0.5 ms
Lead Channel Pacing Threshold Pulse Width: 0.5 ms
Lead Channel Pacing Threshold Pulse Width: 0.5 ms
Lead Channel Sensing Intrinsic Amplitude: 5 mV
Lead Channel Sensing Intrinsic Amplitude: 9 mV
Lead Channel Setting Pacing Amplitude: 1.5 V
Lead Channel Setting Pacing Amplitude: 1.875
Lead Channel Setting Pacing Amplitude: 2.25 V
Lead Channel Setting Pacing Pulse Width: 0.5 ms
Lead Channel Setting Pacing Pulse Width: 0.5 ms
Lead Channel Setting Sensing Sensitivity: 0.5 mV
Pulse Gen Serial Number: 211030161
Zone Setting Status: 755011

## 2023-08-28 NOTE — Progress Notes (Signed)

## 2023-09-16 ENCOUNTER — Inpatient Hospital Stay (HOSPITAL_COMMUNITY)
Admission: EM | Admit: 2023-09-16 | Discharge: 2023-09-22 | DRG: 291 | Disposition: A | Discharge status: Home Health | Payer: BC Managed Care – PPO | Attending: Internal Medicine | Admitting: Internal Medicine

## 2023-09-16 ENCOUNTER — Emergency Department (HOSPITAL_COMMUNITY): Payer: BC Managed Care – PPO

## 2023-09-16 ENCOUNTER — Encounter: Payer: Self-pay | Admitting: Internal Medicine

## 2023-09-16 ENCOUNTER — Other Ambulatory Visit: Payer: Self-pay

## 2023-09-16 ENCOUNTER — Ambulatory Visit: Payer: Medicare Other | Attending: Internal Medicine | Admitting: Internal Medicine

## 2023-09-16 VITALS — BP 142/78 | HR 72 | Ht 68.0 in | Wt 268.8 lb

## 2023-09-16 DIAGNOSIS — I110 Hypertensive heart disease with heart failure: Principal | ICD-10-CM | POA: Diagnosis present

## 2023-09-16 DIAGNOSIS — R0602 Shortness of breath: Secondary | ICD-10-CM | POA: Diagnosis not present

## 2023-09-16 DIAGNOSIS — G4733 Obstructive sleep apnea (adult) (pediatric): Secondary | ICD-10-CM | POA: Diagnosis present

## 2023-09-16 DIAGNOSIS — E1165 Type 2 diabetes mellitus with hyperglycemia: Secondary | ICD-10-CM | POA: Diagnosis present

## 2023-09-16 DIAGNOSIS — F32A Depression, unspecified: Secondary | ICD-10-CM | POA: Diagnosis present

## 2023-09-16 DIAGNOSIS — N179 Acute kidney failure, unspecified: Secondary | ICD-10-CM | POA: Diagnosis not present

## 2023-09-16 DIAGNOSIS — Z7984 Long term (current) use of oral hypoglycemic drugs: Secondary | ICD-10-CM

## 2023-09-16 DIAGNOSIS — Z87442 Personal history of urinary calculi: Secondary | ICD-10-CM

## 2023-09-16 DIAGNOSIS — R0603 Acute respiratory distress: Secondary | ICD-10-CM | POA: Diagnosis present

## 2023-09-16 DIAGNOSIS — E669 Obesity, unspecified: Secondary | ICD-10-CM | POA: Diagnosis present

## 2023-09-16 DIAGNOSIS — Z79899 Other long term (current) drug therapy: Secondary | ICD-10-CM | POA: Diagnosis not present

## 2023-09-16 DIAGNOSIS — I5023 Acute on chronic systolic (congestive) heart failure: Secondary | ICD-10-CM | POA: Diagnosis not present

## 2023-09-16 DIAGNOSIS — E66812 Obesity, class 2: Secondary | ICD-10-CM | POA: Insufficient documentation

## 2023-09-16 DIAGNOSIS — Z950 Presence of cardiac pacemaker: Secondary | ICD-10-CM

## 2023-09-16 DIAGNOSIS — I3139 Other pericardial effusion (noninflammatory): Secondary | ICD-10-CM | POA: Diagnosis present

## 2023-09-16 DIAGNOSIS — Z833 Family history of diabetes mellitus: Secondary | ICD-10-CM

## 2023-09-16 DIAGNOSIS — T502X5A Adverse effect of carbonic-anhydrase inhibitors, benzothiadiazides and other diuretics, initial encounter: Secondary | ICD-10-CM | POA: Diagnosis not present

## 2023-09-16 DIAGNOSIS — F419 Anxiety disorder, unspecified: Secondary | ICD-10-CM | POA: Diagnosis present

## 2023-09-16 DIAGNOSIS — I10 Essential (primary) hypertension: Secondary | ICD-10-CM | POA: Diagnosis present

## 2023-09-16 DIAGNOSIS — E785 Hyperlipidemia, unspecified: Secondary | ICD-10-CM | POA: Diagnosis present

## 2023-09-16 DIAGNOSIS — Z86718 Personal history of other venous thrombosis and embolism: Secondary | ICD-10-CM

## 2023-09-16 DIAGNOSIS — I428 Other cardiomyopathies: Secondary | ICD-10-CM | POA: Diagnosis present

## 2023-09-16 DIAGNOSIS — Z7982 Long term (current) use of aspirin: Secondary | ICD-10-CM

## 2023-09-16 DIAGNOSIS — I5020 Unspecified systolic (congestive) heart failure: Secondary | ICD-10-CM | POA: Diagnosis not present

## 2023-09-16 DIAGNOSIS — R0789 Other chest pain: Secondary | ICD-10-CM | POA: Diagnosis present

## 2023-09-16 DIAGNOSIS — I509 Heart failure, unspecified: Secondary | ICD-10-CM

## 2023-09-16 DIAGNOSIS — I2510 Atherosclerotic heart disease of native coronary artery without angina pectoris: Secondary | ICD-10-CM | POA: Diagnosis present

## 2023-09-16 DIAGNOSIS — R0682 Tachypnea, not elsewhere classified: Secondary | ICD-10-CM

## 2023-09-16 DIAGNOSIS — K219 Gastro-esophageal reflux disease without esophagitis: Secondary | ICD-10-CM | POA: Diagnosis present

## 2023-09-16 DIAGNOSIS — M1990 Unspecified osteoarthritis, unspecified site: Secondary | ICD-10-CM | POA: Diagnosis present

## 2023-09-16 DIAGNOSIS — Z96653 Presence of artificial knee joint, bilateral: Secondary | ICD-10-CM | POA: Diagnosis present

## 2023-09-16 DIAGNOSIS — Z7985 Long-term (current) use of injectable non-insulin antidiabetic drugs: Secondary | ICD-10-CM

## 2023-09-16 DIAGNOSIS — E782 Mixed hyperlipidemia: Secondary | ICD-10-CM | POA: Diagnosis present

## 2023-09-16 DIAGNOSIS — Y92239 Unspecified place in hospital as the place of occurrence of the external cause: Secondary | ICD-10-CM | POA: Diagnosis not present

## 2023-09-16 LAB — COMPREHENSIVE METABOLIC PANEL
ALT: 19 U/L (ref 0–44)
AST: 15 U/L (ref 15–41)
Albumin: 3.5 g/dL (ref 3.5–5.0)
Alkaline Phosphatase: 61 U/L (ref 38–126)
Anion gap: 9 (ref 5–15)
BUN: 16 mg/dL (ref 8–23)
CO2: 22 mmol/L (ref 22–32)
Calcium: 9.2 mg/dL (ref 8.9–10.3)
Chloride: 104 mmol/L (ref 98–111)
Creatinine, Ser: 0.98 mg/dL (ref 0.61–1.24)
GFR, Estimated: >60 mL/min (ref >60)
Glucose, Bld: 214 mg/dL — ABNORMAL HIGH (ref 70–99)
Potassium: 4.1 mmol/L (ref 3.5–5.1)
Sodium: 135 mmol/L (ref 135–145)
Total Bilirubin: 0.9 mg/dL (ref <1.2)
Total Protein: 7.1 g/dL (ref 6.5–8.1)

## 2023-09-16 LAB — URINALYSIS, ROUTINE W REFLEX MICROSCOPIC
Bilirubin Urine: NEGATIVE
Glucose, UA: >=500 mg/dL — AB
Ketones, ur: NEGATIVE mg/dL
Leukocytes,Ua: NEGATIVE
Nitrite: NEGATIVE
Protein, ur: 30 mg/dL — AB
Specific Gravity, Urine: 1.012 (ref 1.005–1.030)
pH: 5 (ref 5.0–8.0)

## 2023-09-16 LAB — CBC
HCT: 46.9 % (ref 39.0–52.0)
Hemoglobin: 14.9 g/dL (ref 13.0–17.0)
MCH: 30.3 pg (ref 26.0–34.0)
MCHC: 31.8 g/dL (ref 30.0–36.0)
MCV: 95.5 fL (ref 80.0–100.0)
Platelets: 141 10*3/uL — ABNORMAL LOW (ref 150–400)
RBC: 4.91 MIL/uL (ref 4.22–5.81)
RDW: 13.6 % (ref 11.5–15.5)
WBC: 10.6 10*3/uL — ABNORMAL HIGH (ref 4.0–10.5)
nRBC: 0 % (ref 0.0–0.2)

## 2023-09-16 LAB — TROPONIN I (HIGH SENSITIVITY)
Troponin I (High Sensitivity): 8 ng/L (ref <18)
Troponin I (High Sensitivity): 8 ng/L (ref <18)

## 2023-09-16 LAB — BRAIN NATRIURETIC PEPTIDE: B Natriuretic Peptide: 169 pg/mL — ABNORMAL HIGH (ref 0.0–100.0)

## 2023-09-16 MED ORDER — TORSEMIDE 10 MG PO TABS: ORAL_TABLET | ORAL | 1 refills | Status: DC

## 2023-09-16 MED ORDER — FUROSEMIDE 10 MG/ML IJ SOLN
60.0000 mg | Freq: Once | INTRAMUSCULAR | Status: AC
  Administered 2023-09-16: 60 mg via INTRAVENOUS
  Filled 2023-09-16: qty 6

## 2023-09-16 MED ORDER — IOHEXOL 350 MG/ML SOLN
75.0000 mL | Freq: Once | INTRAVENOUS | Status: AC | PRN
  Administered 2023-09-16: 75 mL via INTRAVENOUS

## 2023-09-16 NOTE — ED Triage Notes (Addendum)
Pt c/o chest pain that began around 1600 today. Per family pt has gotten weak over the last two days, has not been able to ambulate this afternoon. Endorses SOB  Pt states he was seen at PCP today and was told he had 16 pounds of fluid that he needed to come to ED to have taken off

## 2023-09-16 NOTE — ED Provider Notes (Signed)
[place] EMERGENCY DEPARTMENT AT [facility] Provider Note   CSN: [id number] Arrival date & time: 09/16/23  1953     History  Chief Complaint  Patient presents with   Chest Pain    Noah Hampton is a 72 y.o. male with history of hypertension, diabetes, DVT and PE, HFrEF, presents with concern for chest pain over his defibrillator site which began earlier this evening.  States pain is worse with inspiration.  Denies any pain radiating to the back.  He also reports associated shortness of breath has been ongoing for "a long while".  States he is supposed to be on Lasix daily but has not been taking this medication for weeks.  He was at his cardiologist earlier today, and was told he had 16 pounds of fluid on him that he needed to get off. Denies any new or worsening lower extremity swelling or pain.   Chest Pain      Home Medications Prior to Admission medications   Medication Sig Start Date End Date Taking? Authorizing Provider  acetaminophen (TYLENOL) 500 MG tablet Take 1,000 mg by mouth every 6 (six) hours as needed for moderate pain (pain score 4-6).   Yes [provider]  aspirin EC 81 MG tablet Take 81 mg by mouth daily. Swallow whole.   Yes [provider]  carvedilol (COREG) 6.25 MG tablet Take 1 tablet (6.25 mg total) by mouth 2 (two) times daily with a meal. 11/15/22 01/30/89 Yes Shah, Pratik D, DO  clonazePAM (KLONOPIN) 0.5 MG tablet Take 0.5 mg by mouth 2 (two) times daily. 06/19/23  Yes [provider]  dimenhyDRINATE (DRAMAMINE) 50 MG tablet Take 50 mg by mouth as needed for dizziness (Recent onset of dizziniess).   Yes [provider]  escitalopram (LEXAPRO) 10 MG tablet Take 10 mg by mouth daily. 02/12/22  Yes [provider]  Evolocumab (REPATHA SURECLICK) 140 MG/ML SOAJ Inject 140 mg into the skin every 14 (fourteen) days. 02/24/23  Yes Mallipeddi, Vishnu P, MD  FARXIGA 10 MG TABS tablet Take 10 mg by mouth daily.  02/12/22  Yes [provider]  Menthol-Camphor (TIGER BALM ARTHRITIS RUB EX) Apply 1 Application topically daily as needed (pain).   Yes [provider]  naproxen sodium (ALEVE) 220 MG tablet Take 440 mg by mouth daily as needed (Pain).   Yes [provider]  sacubitril-valsartan (ENTRESTO) 97-103 MG Take 1 tablet by mouth 2 (two) times daily. 08/12/23  Yes Mallipeddi, Vishnu P, MD  Semaglutide,0.25 or 0.5MG /DOS, (OZEMPIC, 0.25 OR 0.5 MG/DOSE,) 2 MG/3ML SOPN Inject 0.25 mg as directed once a week.   Yes [provider]  spironolactone (ALDACTONE) 25 MG tablet Take 1 tablet (25 mg total) by mouth daily. 02/03/23  Yes Mallipeddi, Vishnu P, MD  torsemide (DEMADEX) 10 MG tablet Take 3 tablets (30 mg) twice daily for 5 days, Then reduce to 3 tablets (30 mg) in the morning and one tablet (10 mg) in the evening. Patient taking differently: Take 10-30 mg by mouth See admin instructions. Take 3 tablets (30 mg) twice daily for 5 days, Then reduce to 3 tablets (30 mg) in the morning and one tablet (10 mg) in the evening. 09/16/23  Yes Mallipeddi, Vishnu P, MD  aspirin EC 81 MG tablet Take 81 mg by mouth daily. Swallow whole.    [provider]  potassium chloride SA (KLOR-CON M) 20 MEQ tablet Take 1 tablet (20 mEq total) by mouth daily. Patient not taking: Reported on  09/16/2023 02/03/23   Mallipeddi, Orion Modest, MD      Allergies    Statins and Alpha-gal    Review of Systems   Review of Systems  Cardiovascular:  Positive for chest pain.    Physical Exam Updated Vital Signs BP (!) 104/92   Pulse (!) 106   Temp 98.7 F (37.1 C)   Resp (!) 47   SpO2 93%  Physical Exam Vitals and nursing note reviewed.  Constitutional:      General: He is not in acute distress.    Appearance: He is well-developed.  HENT:     Head: Normocephalic and atraumatic.  Eyes:     Conjunctiva/sclera: Conjunctivae normal.  Cardiovascular:     Rate and Rhythm: Regular rhythm.  Tachycardia present.     Heart sounds: No murmur heard.    Comments: 2+ radial pulses bilaterally, 2+ dorsalis pedis pulses bilaterally Pulmonary:     Effort: Pulmonary effort is normal. No respiratory distress.     Breath sounds: Normal breath sounds.     Comments: Tachypnea but able to talk in full sentences on room air Abdominal:     Palpations: Abdomen is soft.     Tenderness: There is no abdominal tenderness.  Musculoskeletal:        General: No swelling.     Cervical back: Neck supple.     Comments: Nonpitting edema of the feet bilaterally No calf TTP No tenderness to palpation of chest wall  Skin:    General: Skin is warm and dry.     Capillary Refill: Capillary refill takes less than 2 seconds.  Neurological:     Mental Status: He is alert.  Psychiatric:        Mood and Affect: Mood normal.     ED Results / Procedures / Treatments   Labs (all labs ordered are listed, but only abnormal results are displayed) Labs Reviewed  CBC - Abnormal; Notable for the following components:      Result Value   WBC 10.6 (*)    Platelets 141 (*)    All other components within normal limits  COMPREHENSIVE METABOLIC PANEL - Abnormal; Notable for the following components:   Glucose, Bld 214 (*)    All other components within normal limits  BRAIN NATRIURETIC PEPTIDE - Abnormal; Notable for the following components:   B Natriuretic Peptide 169.0 (*)    All other components within normal limits  URINALYSIS, ROUTINE W REFLEX MICROSCOPIC - Abnormal; Notable for the following components:   Color, Urine STRAW (*)    Glucose, UA >=500 (*)    Hgb urine dipstick SMALL (*)    Protein, ur 30 (*)    Bacteria, UA RARE (*)    All other components within normal limits  TROPONIN I (HIGH SENSITIVITY)  TROPONIN I (HIGH SENSITIVITY)    EKG EKG Interpretation Date/Time:  Tuesday September 16 2023 20:02:32 EST Ventricular Rate:  106 PR Interval:  177 QRS Duration:  137 QT Interval:  380 QTC  Calculation: 505 R Axis:   179  Text Interpretation: Atrial-sensed ventricular-paced rhythm Confirmed by Alvino Blood ([id number]) on 09/16/2023 8:17:30 PM  Radiology CT Angio Chest PE W and/or Wo Contrast Result Date: 09/16/2023 CLINICAL DATA:  Chest pain EXAM: CT ANGIOGRAPHY CHEST WITH CONTRAST TECHNIQUE: Multidetector CT imaging of the chest was performed using the standard protocol during bolus administration of intravenous contrast. Multiplanar CT image reconstructions and MIPs were obtained to evaluate the vascular anatomy. RADIATION DOSE REDUCTION: This exam was performed  according to the departmental dose-optimization program which includes automated exposure control, adjustment of the mA and/or kV according to patient size and/or use of iterative reconstruction technique. CONTRAST:  75mL OMNIPAQUE IOHEXOL 350 MG/ML SOLN COMPARISON:  11/09/2022 FINDINGS: Cardiovascular: No filling defects in the pulmonary arteries to suggest pulmonary emboli. Cardiomegaly. Pacer wires in the right heart. Small to moderate pericardial effusion. Aorta normal caliber. Scattered coronary artery and aortic calcifications. Mediastinum/Nodes: No mediastinal, hilar, or axillary adenopathy. Trachea and esophagus are unremarkable. Thyroid unremarkable. Lungs/Pleura: Bibasilar scarring or atelectasis. No confluent opacities, apart edema, or effusions. Upper Abdomen: No acute findings Musculoskeletal: Chest wall soft tissues are unremarkable. No acute bony abnormality. Review of the MIP images confirms the above findings. IMPRESSION: No evidence of pulmonary embolus. Cardiomegaly.  Small to moderate pericardial effusion. Coronary artery disease. Bibasilar atelectasis or scarring. Aortic Atherosclerosis (ICD10-I70.0). Electronically Signed   By: Charlett Nose M.D.   On: 09/16/2023 21:31   DG Chest Port 1 View Result Date: 09/16/2023 CLINICAL DATA:  Chest pain. EXAM: PORTABLE CHEST 1 VIEW COMPARISON:  08/08/2023. FINDINGS:  Bilateral lung fields are clear. Bilateral costophrenic angles are clear. Stable cardio-mediastinal silhouette. There is a left sided 3-lead pacemaker. No acute osseous abnormalities. Left lateral clavicular ORIF noted. The soft tissues are within normal limits. IMPRESSION: No active disease. Electronically Signed   By: Jules Schick M.D.   On: 09/16/2023 20:48    Procedures Procedures    Medications Ordered in ED Medications  iohexol (OMNIPAQUE) 350 MG/ML injection 75 mL (75 mLs Intravenous Contrast Given 09/16/23 2113)  furosemide (LASIX) injection 60 mg (60 mg Intravenous Given 09/16/23 2149)    ED Course/ Medical Decision Making/ A&P                                 Medical Decision Making Amount and/or Complexity of Data Reviewed Labs: ordered. Radiology: ordered.  Risk Prescription drug management.     Differential diagnosis includes but is not limited to ACS, arrhythmia, DVT, PE, pneumonia, CHF  ED Course:  Upon initial evaluation, patient appears to be in mild discomfort with tachypnea, but is talking in full sentences on room air.  Maintaining a stat of 93% room air.  He does remain slightly tachycardic at 106.  Initial troponin of 8, awaiting repeat.  EKG with tachycardia, but otherwise with no signs of ischemia.  Low concern for ACS at this time. BNP slightly elevated at 169.  Chest x-ray normal, no signs of pulmonary edema.  He does have bilateral lower extremity edema.  Per cardiology note he is up 16 lbs since November. He was given 60 mg Lasix IV for diuresis.  Obtain CT angio of the chest for PE workup given his new onset chest pain, shortness of breath, and tachycardia.  This showed no evidence of PE.  Did show cardiomegaly with small to moderate pericardial effusion Upon reevaluation, patient still remains tachypneic and feels short of breath.  Will plan on admission for further diuresis.  Impression: HF exacerbation   Disposition:  Admission with Dr.  Lazarus Salines for continued diuresis   Lab Tests: I Ordered, and personally interpreted labs.  The pertinent results include:   CBC, CMP unremarkable BNP elevated at 169 Troponin of 8, repeat pending   Imaging Studies ordered: I ordered imaging studies including chest x-ray, CT angio chest I independently visualized the imaging with scope of interpretation limited to determining acute life threatening conditions related to  emergency care.  Chest x-ray without any acute findings CTA chest with no evidence of PE.  Did show cardiomegaly with small to moderate pericardial effusion I agree with the radiologist interpretation   Cardiac Monitoring: / EKG: The patient was maintained on a cardiac monitor.  I personally viewed and interpreted the cardiac monitored which showed an underlying rhythm of: Tachycardia. Paced rhythm, no signs of ischemia   Consultations Obtained: I requested consultation with the hospitalist Dr Lazarus Salines,  and discussed lab and imaging findings as well as pertinent plan - they recommend: Admission for further diuresis   External records from outside source obtained and reviewed including cardiology note from earlier today where he was found to have a 16 pound weight gain since November, he switched from Lasix to torsemide.  Cardiology wanted to get patient down 8 lbs this week per cardiology note from today. 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  Echocardiogram from 05/27/2023 reviewed which showed LVEF 30 to 35%              Final Clinical Impression(s) / ED Diagnoses Final diagnoses:  Shortness of breath  Tachypnea    Rx / DC Orders ED Discharge Orders     None         Arabella Merles, Cordelia Poche 09/16/23 2311    Lonell Grandchild, MD 09/17/23 [phone]
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
## 2023-09-16 NOTE — Patient Instructions (Addendum)
Medication Instructions:  Your physician has recommended you make the following change in your medication:  Stop taking Eliquis  Stop taking Lasix Start taking Torsemide 30 mg twice daily for 5 days then take 30 mg in the morning and 10 mg in the evening Take Potassium 10 meq daily as needed for leg cramps( Can take half of the 20 meq) Can take Meclizine over the counter  Continue taking all other medications as prescribed  Labwork: BMP in a week at [facility] in [place]  Testing/Procedures: None  Follow-Up: Your physician recommends that you schedule a follow-up appointment in: See Lanora Manis first available  Any Other Special Instructions Will Be Listed Below (If Applicable). If you have any weight gain that is more than 8lbs in 5 days please go to the ER  Thank you for choosing [facility]!      If you need a refill on your cardiac medications before your next appointment, please call your pharmacy.

## 2023-09-17 ENCOUNTER — Other Ambulatory Visit (HOSPITAL_COMMUNITY): Payer: Self-pay | Admitting: *Deleted

## 2023-09-17 ENCOUNTER — Encounter (HOSPITAL_COMMUNITY): Payer: Self-pay | Admitting: Internal Medicine

## 2023-09-17 ENCOUNTER — Inpatient Hospital Stay (HOSPITAL_COMMUNITY): Payer: Medicare Other

## 2023-09-17 DIAGNOSIS — Z950 Presence of cardiac pacemaker: Secondary | ICD-10-CM | POA: Diagnosis not present

## 2023-09-17 DIAGNOSIS — E669 Obesity, unspecified: Secondary | ICD-10-CM | POA: Diagnosis present

## 2023-09-17 DIAGNOSIS — F419 Anxiety disorder, unspecified: Secondary | ICD-10-CM | POA: Diagnosis present

## 2023-09-17 DIAGNOSIS — K219 Gastro-esophageal reflux disease without esophagitis: Secondary | ICD-10-CM | POA: Diagnosis present

## 2023-09-17 DIAGNOSIS — I5020 Unspecified systolic (congestive) heart failure: Secondary | ICD-10-CM | POA: Diagnosis not present

## 2023-09-17 DIAGNOSIS — Z7982 Long term (current) use of aspirin: Secondary | ICD-10-CM | POA: Diagnosis not present

## 2023-09-17 DIAGNOSIS — I5043 Acute on chronic combined systolic (congestive) and diastolic (congestive) heart failure: Secondary | ICD-10-CM | POA: Diagnosis not present

## 2023-09-17 DIAGNOSIS — E1165 Type 2 diabetes mellitus with hyperglycemia: Secondary | ICD-10-CM | POA: Diagnosis present

## 2023-09-17 DIAGNOSIS — N179 Acute kidney failure, unspecified: Secondary | ICD-10-CM | POA: Diagnosis not present

## 2023-09-17 DIAGNOSIS — Z79899 Other long term (current) drug therapy: Secondary | ICD-10-CM | POA: Diagnosis not present

## 2023-09-17 DIAGNOSIS — I5023 Acute on chronic systolic (congestive) heart failure: Secondary | ICD-10-CM

## 2023-09-17 DIAGNOSIS — E782 Mixed hyperlipidemia: Secondary | ICD-10-CM | POA: Diagnosis present

## 2023-09-17 DIAGNOSIS — T502X5A Adverse effect of carbonic-anhydrase inhibitors, benzothiadiazides and other diuretics, initial encounter: Secondary | ICD-10-CM | POA: Diagnosis not present

## 2023-09-17 DIAGNOSIS — E785 Hyperlipidemia, unspecified: Secondary | ICD-10-CM | POA: Diagnosis not present

## 2023-09-17 DIAGNOSIS — Z87442 Personal history of urinary calculi: Secondary | ICD-10-CM | POA: Diagnosis not present

## 2023-09-17 DIAGNOSIS — E66812 Obesity, class 2: Secondary | ICD-10-CM | POA: Diagnosis not present

## 2023-09-17 DIAGNOSIS — Y92239 Unspecified place in hospital as the place of occurrence of the external cause: Secondary | ICD-10-CM | POA: Diagnosis not present

## 2023-09-17 DIAGNOSIS — Z86718 Personal history of other venous thrombosis and embolism: Secondary | ICD-10-CM | POA: Diagnosis not present

## 2023-09-17 DIAGNOSIS — F32A Depression, unspecified: Secondary | ICD-10-CM | POA: Diagnosis present

## 2023-09-17 DIAGNOSIS — M1990 Unspecified osteoarthritis, unspecified site: Secondary | ICD-10-CM | POA: Diagnosis present

## 2023-09-17 DIAGNOSIS — I110 Hypertensive heart disease with heart failure: Secondary | ICD-10-CM | POA: Diagnosis present

## 2023-09-17 DIAGNOSIS — Z7985 Long-term (current) use of injectable non-insulin antidiabetic drugs: Secondary | ICD-10-CM | POA: Diagnosis not present

## 2023-09-17 DIAGNOSIS — R0789 Other chest pain: Secondary | ICD-10-CM | POA: Diagnosis present

## 2023-09-17 DIAGNOSIS — R0602 Shortness of breath: Secondary | ICD-10-CM | POA: Diagnosis present

## 2023-09-17 DIAGNOSIS — Z7984 Long term (current) use of oral hypoglycemic drugs: Secondary | ICD-10-CM | POA: Diagnosis not present

## 2023-09-17 DIAGNOSIS — G4733 Obstructive sleep apnea (adult) (pediatric): Secondary | ICD-10-CM | POA: Diagnosis present

## 2023-09-17 DIAGNOSIS — I10 Essential (primary) hypertension: Secondary | ICD-10-CM | POA: Diagnosis not present

## 2023-09-17 DIAGNOSIS — Z96653 Presence of artificial knee joint, bilateral: Secondary | ICD-10-CM | POA: Diagnosis present

## 2023-09-17 DIAGNOSIS — I2510 Atherosclerotic heart disease of native coronary artery without angina pectoris: Secondary | ICD-10-CM | POA: Diagnosis present

## 2023-09-17 DIAGNOSIS — I3139 Other pericardial effusion (noninflammatory): Secondary | ICD-10-CM | POA: Diagnosis present

## 2023-09-17 DIAGNOSIS — I428 Other cardiomyopathies: Secondary | ICD-10-CM | POA: Diagnosis present

## 2023-09-17 LAB — RESPIRATORY PANEL BY PCR

## 2023-09-17 LAB — BASIC METABOLIC PANEL
Anion gap: 11 (ref 5–15)
BUN: 17 mg/dL (ref 8–23)
CO2: 27 mmol/L (ref 22–32)
Calcium: 9.2 mg/dL (ref 8.9–10.3)
Chloride: 98 mmol/L (ref 98–111)
Creatinine, Ser: 1.14 mg/dL (ref 0.61–1.24)
GFR, Estimated: >60 mL/min (ref >60)
Glucose, Bld: 212 mg/dL — ABNORMAL HIGH (ref 70–99)
Potassium: 3.9 mmol/L (ref 3.5–5.1)
Sodium: 136 mmol/L (ref 135–145)

## 2023-09-17 LAB — MRSA NEXT GEN BY PCR, NASAL: MRSA by PCR Next Gen: NOT DETECTED

## 2023-09-17 LAB — CBC
HCT: 47.6 % (ref 39.0–52.0)
Hemoglobin: 14.8 g/dL (ref 13.0–17.0)
MCH: 29.6 pg (ref 26.0–34.0)
MCHC: 31.1 g/dL (ref 30.0–36.0)
MCV: 95.2 fL (ref 80.0–100.0)
Platelets: 148 10*3/uL — ABNORMAL LOW (ref 150–400)
RBC: 5 MIL/uL (ref 4.22–5.81)
RDW: 13.7 % (ref 11.5–15.5)
WBC: 10.7 10*3/uL — ABNORMAL HIGH (ref 4.0–10.5)
nRBC: 0 % (ref 0.0–0.2)

## 2023-09-17 LAB — GLUCOSE, CAPILLARY
Glucose-Capillary: 197 mg/dL — ABNORMAL HIGH (ref 70–99)
Glucose-Capillary: 257 mg/dL — ABNORMAL HIGH (ref 70–99)
Glucose-Capillary: 205 mg/dL — ABNORMAL HIGH (ref 70–99)
Glucose-Capillary: 189 mg/dL — ABNORMAL HIGH (ref 70–99)

## 2023-09-17 LAB — PHOSPHORUS: Phosphorus: 3.7 mg/dL (ref 2.5–4.6)

## 2023-09-17 LAB — ECHOCARDIOGRAM COMPLETE
Area-P 1/2: 4.68 cm2
Height: 68 in
S' Lateral: 3.1 cm
Weight: 4151.7 [oz_av]

## 2023-09-17 LAB — LIPASE, BLOOD: Lipase: 31 U/L (ref 11–51)

## 2023-09-17 LAB — MAGNESIUM: Magnesium: 2 mg/dL (ref 1.7–2.4)

## 2023-09-17 MED ORDER — SODIUM CHLORIDE 0.9% FLUSH
3.0000 mL | Freq: Two times a day (BID) | INTRAVENOUS | Status: DC
  Administered 2023-09-17 – 2023-09-22 (×11): 3 mL via INTRAVENOUS

## 2023-09-17 MED ORDER — CARVEDILOL 3.125 MG PO TABS
6.2500 mg | ORAL_TABLET | Freq: Two times a day (BID) | ORAL | Status: DC
  Administered 2023-09-17 – 2023-09-22 (×11): 6.25 mg via ORAL
  Filled 2023-09-17 (×11): qty 2

## 2023-09-17 MED ORDER — PANTOPRAZOLE SODIUM 40 MG PO TBEC
40.0000 mg | DELAYED_RELEASE_TABLET | Freq: Every day | ORAL | Status: DC
  Administered 2023-09-17 – 2023-09-22 (×6): 40 mg via ORAL
  Filled 2023-09-17 (×6): qty 1

## 2023-09-17 MED ORDER — FUROSEMIDE 10 MG/ML IJ SOLN
40.0000 mg | Freq: Every day | INTRAMUSCULAR | Status: DC
  Administered 2023-09-17 – 2023-09-19 (×3): 40 mg via INTRAVENOUS
  Filled 2023-09-17 (×5): qty 4

## 2023-09-17 MED ORDER — CLONAZEPAM 0.5 MG PO TABS
0.5000 mg | ORAL_TABLET | Freq: Two times a day (BID) | ORAL | Status: DC | PRN
Start: 2023-09-17 — End: 2023-09-22
  Administered 2023-09-21: 0.5 mg via ORAL
  Filled 2023-09-17: qty 1

## 2023-09-17 MED ORDER — ESCITALOPRAM OXALATE 10 MG PO TABS
10.0000 mg | ORAL_TABLET | Freq: Every day | ORAL | Status: DC
  Administered 2023-09-17 – 2023-09-22 (×6): 10 mg via ORAL
  Filled 2023-09-17 (×6): qty 1

## 2023-09-17 MED ORDER — ONDANSETRON HCL 4 MG/2ML IJ SOLN
4.0000 mg | Freq: Four times a day (QID) | INTRAMUSCULAR | Status: DC | PRN
  Administered 2023-09-19 – 2023-09-20 (×2): 4 mg via INTRAVENOUS
  Filled 2023-09-17 (×2): qty 2

## 2023-09-17 MED ORDER — DAPAGLIFLOZIN PROPANEDIOL 10 MG PO TABS
10.0000 mg | ORAL_TABLET | Freq: Every day | ORAL | Status: DC
  Administered 2023-09-17 – 2023-09-22 (×6): 10 mg via ORAL
  Filled 2023-09-17 (×7): qty 1

## 2023-09-17 MED ORDER — PERFLUTREN LIPID MICROSPHERE
1.0000 mL | INTRAVENOUS | Status: AC | PRN
Start: 2023-09-17 — End: 2023-09-17
  Administered 2023-09-17: 4 mL via INTRAVENOUS

## 2023-09-17 MED ORDER — ALBUTEROL SULFATE (2.5 MG/3ML) 0.083% IN NEBU: 2.5000 mg | INHALATION_SOLUTION | Freq: Four times a day (QID) | RESPIRATORY_TRACT | Status: DC | PRN

## 2023-09-17 MED ORDER — INSULIN ASPART 100 UNIT/ML IJ SOLN
0.0000 [IU] | Freq: Three times a day (TID) | INTRAMUSCULAR | Status: DC
  Administered 2023-09-17: 2 [IU] via SUBCUTANEOUS
  Administered 2023-09-17: 3 [IU] via SUBCUTANEOUS
  Administered 2023-09-17 – 2023-09-18 (×2): 1 [IU] via SUBCUTANEOUS
  Administered 2023-09-18 (×2): 2 [IU] via SUBCUTANEOUS
  Administered 2023-09-19: 3 [IU] via SUBCUTANEOUS
  Administered 2023-09-20 (×2): 1 [IU] via SUBCUTANEOUS
  Administered 2023-09-20: 2 [IU] via SUBCUTANEOUS
  Administered 2023-09-21: 1 [IU] via SUBCUTANEOUS
  Administered 2023-09-21: 3 [IU] via SUBCUTANEOUS
  Administered 2023-09-21 – 2023-09-22 (×2): 1 [IU] via SUBCUTANEOUS

## 2023-09-17 MED ORDER — ALUM & MAG HYDROXIDE-SIMETH 200-200-20 MG/5ML PO SUSP
30.0000 mL | Freq: Once | ORAL | Status: AC
  Administered 2023-09-17: 30 mL via ORAL
  Filled 2023-09-17: qty 30

## 2023-09-17 MED ORDER — ENOXAPARIN SODIUM 40 MG/0.4ML IJ SOSY
40.0000 mg | PREFILLED_SYRINGE | INTRAMUSCULAR | Status: DC
  Administered 2023-09-17 – 2023-09-22 (×6): 40 mg via SUBCUTANEOUS
  Filled 2023-09-17 (×5): qty 0.4

## 2023-09-17 MED ORDER — ASPIRIN 81 MG PO TBEC
81.0000 mg | DELAYED_RELEASE_TABLET | Freq: Every day | ORAL | Status: DC
  Administered 2023-09-17 – 2023-09-22 (×6): 81 mg via ORAL
  Filled 2023-09-17 (×6): qty 1

## 2023-09-17 MED ORDER — SPIRONOLACTONE 25 MG PO TABS
25.0000 mg | ORAL_TABLET | Freq: Every day | ORAL | Status: DC
Start: 2023-09-17 — End: 2023-09-22
  Administered 2023-09-17 – 2023-09-22 (×6): 25 mg via ORAL
  Filled 2023-09-17 (×6): qty 1

## 2023-09-17 MED ORDER — ACETAMINOPHEN 500 MG PO TABS
1000.0000 mg | ORAL_TABLET | Freq: Four times a day (QID) | ORAL | Status: DC | PRN
  Administered 2023-09-21: 1000 mg via ORAL
  Filled 2023-09-17 (×2): qty 2

## 2023-09-17 MED ORDER — INFLUENZA VAC A&B SURF ANT ADJ 0.5 ML IM SUSY: 0.5000 mL | PREFILLED_SYRINGE | INTRAMUSCULAR | Status: DC

## 2023-09-17 MED ORDER — PROCHLORPERAZINE EDISYLATE 10 MG/2ML IJ SOLN
10.0000 mg | INTRAMUSCULAR | Status: DC | PRN
  Administered 2023-09-17: 10 mg via INTRAVENOUS
  Filled 2023-09-17: qty 2

## 2023-09-17 MED ORDER — FUROSEMIDE 10 MG/ML IJ SOLN
60.0000 mg | Freq: Once | INTRAMUSCULAR | Status: AC
  Administered 2023-09-17: 60 mg via INTRAVENOUS
  Filled 2023-09-17: qty 6

## 2023-09-17 MED ORDER — SACUBITRIL-VALSARTAN 97-103 MG PO TABS
1.0000 | ORAL_TABLET | Freq: Two times a day (BID) | ORAL | Status: DC
  Administered 2023-09-17 – 2023-09-20 (×8): 1 via ORAL
  Filled 2023-09-17 (×13): qty 1

## 2023-09-17 NOTE — Progress Notes (Signed)
Pt arrived to room 334 via bed from ICU. Pt able to get out of bed with assist x1, ambulatory with FWW and standby assist to scale and then to BR and back to bed. Pt tolerated ambulation well, gait steady. Pt dyspneic on exertion but denies c/o. SaO2 97% on 2lpm [place] with exertion.O2 removed to evalate for desaturation and/or increased SOB on room air. Pt aware and agreeable. Bed alarm on for safety, call bell within reach. Pt advised of safety precautions and to call for needs, states understanding.

## 2023-09-17 NOTE — Progress Notes (Signed)
   09/17/23 0905  TOC Brief Assessment  Insurance and Status Reviewed  Patient has primary care physician Yes  Home environment has been reviewed from home  Prior level of function: independent  Prior/Current Home Services No current home services  Social Drivers of Health Review SDOH reviewed no interventions necessary  Readmission risk has been reviewed Yes  Transition of care needs no transition of care needs at this time    CHF screen completed. Educational information added to AVS.  Transition of Care Department (TOC) has reviewed patient and no TOC needs have been identified at this time. We will continue to monitor patient advancement through interdisciplinary progression rounds. If new patient transition needs arise, please place a TOC consult.

## 2023-09-17 NOTE — Progress Notes (Signed)
*  PRELIMINARY RESULTS* Echocardiogram 2D Echocardiogram has been performed with Definity.  Stacey Drain 09/17/2023, 9:52 AM
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
## 2023-09-17 NOTE — Progress Notes (Addendum)
PROGRESS NOTE   Noah Hampton  UEA:[id number] DOB: 1951-09-09 DOA: 09/16/2023 PCP: Amedeo Plenty, DO   Chief Complaint  Patient presents with   Chest Pain   Level of care: Telemetry  Brief Admission History:  72 y.o. male with hx of heart failure with reduced ejection fraction, recent BiV ICD placement, nonobstructive coronary disease, DVT completed anticoagulation, hypertension, hyperlipidemia, diabetes, obesity, mood disorder, gout, who presents from home due to chest pain and dyspnea.  Reports having nonexertional chest pain located to his upper chest/lower neck worse when leaning over.  Otherwise progressive dyspnea at rest and with exertion that he is not able to clarify further.  Associated lower extremity edema.  Denies any fever, chills, cough/cold symptoms.  He acknowledges skipping doses of his Lasix because he does not like urinating so much.  He has poor understanding of his home medications.  Was seen by cardiology earlier in the day before admission and noted to have 16 pound weight gain over the past month.    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 and has been on GDMT - fortunately his LVEF improved to 30-35% 9/24 - he presented with dyspnea and 16# weight gain  - his dry weight reportedly has been 251#  - he is being treated with IV furosemide 40 mg daily  - monitor I/O and weights - monitoring electrolytes and renal function  - repeat TTE was ordered and pending  - obtain high quality ReDs vest reading daily - inpatient cardiology team to see 12/26  Atypical chest pain symptoms - symptoms more consistent with GI  - treated with pantoprazole and GI cocktail - ACS ruled out  - high sensitive troponin tests reassuring - pt had a cardiac cath 4/24 with findings of mild to mod nonobstructive CAD   History of DVT - he completed 6 months of DOAC - he was taken off DOAC by Dr. Jenene Slicker recently - he is on DVT prevention  chemoprophylaxis  Essential hypertension  - his GDMT medications have been restarted - his blood pressures are controlled - following closely  Hyperlipidemia  - pt is being treated with repatha   Type 2 DM with vascular complications - follow up A1c testing  - SSI coverage ordered - we resumed home dapagliflozin 10 mg daily   Anxiety/Depression - resumed home escitalopram 10 mg daily   GERD - pantoprazole 40 mg daily ordered   OSA - will offer nightly CPAP with auto-titration while in hospital   DVT prophylaxis: enoxaparin  Code Status: Full  Family Communication: discussed plan of care with patient  Disposition: anticipate home    Consultants:  Cardiology  Procedures:   Antimicrobials:    Subjective: Pt reports ongoing shortness of breath symptoms.  He has been urinating frequently since admission.  He denies chest pain currently.   Objective: Vitals:   09/17/23 0800 09/17/23 0846 09/17/23 1100 09/17/23 1129  BP:   132/71 129/81  Pulse: 86 89 85 92  Resp: 20 19 (!) 32 (!) 24  Temp: (!) 97.3 F (36.3 C)   97.7 F (36.5 C)  TempSrc: Oral   Oral  SpO2: 94% 96% 96% 97%  Weight:    116 kg  Height:    5\' 8"  (1.727 m)    Intake/Output Summary (Last 24 hours) at 09/17/2023 1151 Last data filed at 09/17/2023 1136 Gross per 24 hour  Intake 60 ml  Output 600 ml  Net -540 ml   [employer]  09/17/23 0314 09/17/23 1129  Weight: 117.7 kg 116 kg   Examination:  General exam: chronically ill appearing male.  Appears calm and comfortable  Respiratory system: Clear to auscultation. Respiratory effort normal. Cardiovascular system: normal S1 & S2 heard. No JVD, murmurs, rubs, gallops or clicks. No pedal edema. Gastrointestinal system: Abdomen is distended with fluid waves, soft and nontender. No organomegaly or masses felt. Normal bowel sounds heard. Central nervous system: Alert and oriented. No focal neurological deficits. Extremities: trace pretibial edema  BLEs.  Symmetric 5 x 5 power. Skin: No rashes, lesions or ulcers. Psychiatry: Judgement and insight appear normal. Mood & affect appropriate.   Data Reviewed: I have personally reviewed following labs and imaging studies  CBC: Recent Labs  Lab 09/16/23 2024 09/17/23 0516  WBC 10.6* 10.7*  HGB 14.9 14.8  HCT 46.9 47.6  MCV 95.5 95.2  PLT 141* 148*    Basic Metabolic Panel: Recent Labs  Lab 09/16/23 2024 09/17/23 0516  NA 135 136  K 4.1 3.9  CL 104 98  CO2 22 27  GLUCOSE 214* 212*  BUN 16 17  CREATININE 0.98 1.14  CALCIUM 9.2 9.2  MG  --  2.0  PHOS  --  3.7    CBG: Recent Labs  Lab 09/17/23 0743 09/17/23 1144  GLUCAP 197* 257*    Recent Results (from the past 240 hours)  MRSA Next Gen by PCR, Nasal     Status: None   Collection Time: 09/17/23  4:52 AM   Specimen: Nasal Mucosa; Nasal Swab  Result Value Ref Range Status   MRSA by PCR Next Gen NOT DETECTED NOT DETECTED Final    Comment: (NOTE) The GeneXpert MRSA Assay (FDA approved for NASAL specimens only), is one component of a comprehensive MRSA colonization surveillance program. It is not intended to diagnose MRSA infection nor to guide or monitor treatment for MRSA infections. Test performance is not FDA approved in patients less than 59 years old. Performed at [facility], [address], [place], [place] [address]      Radiology Studies: CT Angio Chest PE W and/or Wo Contrast Result Date: 09/16/2023 CLINICAL DATA:  Chest pain EXAM: CT ANGIOGRAPHY CHEST WITH CONTRAST TECHNIQUE: Multidetector CT imaging of the chest was performed using the standard protocol during bolus administration of intravenous contrast. Multiplanar CT image reconstructions and MIPs were obtained to evaluate the vascular anatomy. RADIATION DOSE REDUCTION: This exam was performed according to the departmental dose-optimization program which includes automated exposure control, adjustment of the mA and/or kV according to patient size  and/or use of iterative reconstruction technique. CONTRAST:  75mL OMNIPAQUE IOHEXOL 350 MG/ML SOLN COMPARISON:  11/09/2022 FINDINGS: Cardiovascular: No filling defects in the pulmonary arteries to suggest pulmonary emboli. Cardiomegaly. Pacer wires in the right heart. Small to moderate pericardial effusion. Aorta normal caliber. Scattered coronary artery and aortic calcifications. Mediastinum/Nodes: No mediastinal, hilar, or axillary adenopathy. Trachea and esophagus are unremarkable. Thyroid unremarkable. Lungs/Pleura: Bibasilar scarring or atelectasis. No confluent opacities, apart edema, or effusions. Upper Abdomen: No acute findings Musculoskeletal: Chest wall soft tissues are unremarkable. No acute bony abnormality. Review of the MIP images confirms the above findings. IMPRESSION: No evidence of pulmonary embolus. Cardiomegaly.  Small to moderate pericardial effusion. Coronary artery disease. Bibasilar atelectasis or scarring. Aortic Atherosclerosis (ICD10-I70.0). Electronically Signed   By: Charlett Nose M.D.   On: 09/16/2023 21:31   DG Chest Port 1 View Result Date: 09/16/2023 CLINICAL DATA:  Chest pain. EXAM: PORTABLE CHEST 1 VIEW COMPARISON:  08/08/2023.  FINDINGS: Bilateral lung fields are clear. Bilateral costophrenic angles are clear. Stable cardio-mediastinal silhouette. There is a left sided 3-lead pacemaker. No acute osseous abnormalities. Left lateral clavicular ORIF noted. The soft tissues are within normal limits. IMPRESSION: No active disease. Electronically Signed   By: Jules Schick M.D.   On: 09/16/2023 20:48   Scheduled Meds:  aspirin EC  81 mg Oral Daily   carvedilol  6.25 mg Oral BID WC   dapagliflozin propanediol  10 mg Oral Daily   enoxaparin (LOVENOX) injection  40 mg Subcutaneous Q24H   escitalopram  10 mg Oral Daily   furosemide  40 mg Intravenous Daily   [START ON 09/18/2023] influenza vaccine adjuvanted  0.5 mL Intramuscular Tomorrow-1000   insulin aspart  0-6 Units  Subcutaneous TID WC   pantoprazole  40 mg Oral Q0600   sacubitril-valsartan  1 tablet Oral BID   sodium chloride flush  3 mL Intravenous Q12H   spironolactone  25 mg Oral Daily   Continuous Infusions:   LOS: 0 days   Time spent: 55 mins  Chamaine Stankus Laural Benes, MD How to contact the [facility] Attending or Consulting provider 7A - 7P or covering provider during after hours 7P -7A, for this patient?  Check the care team in [facility] and look for a) attending/consulting [facility] provider listed and b) the [facility] team listed Log into [email or web address] to find provider on call.  Locate the [facility] provider you are looking for under [facility] and page to a number that you can be directly reached. If you still have difficulty reaching the provider, please page the [facility] (Director on Call) for the Hospitalists listed on amion for assistance.  09/17/2023, 11:51 AM

## 2023-09-17 NOTE — Hospital Course (Addendum)
72 y.o. male with hx of heart failure with reduced ejection fraction, recent BiV ICD placement, nonobstructive coronary disease, DVT completed anticoagulation, hypertension, hyperlipidemia, diabetes, obesity, mood disorder, gout, who presents from home due to chest pain and dyspnea.  Reports having nonexertional chest pain located to his upper chest/lower neck worse when leaning over.  Otherwise progressive dyspnea at rest and with exertion that he is not able to clarify further.  Associated lower extremity edema.  Denies any fever, chills, cough/cold symptoms.  He acknowledges skipping doses of his Lasix because he does not like urinating so much.  He has poor understanding of his home medications.  Was seen by cardiology earlier in the day before admission and noted to have 16 pound weight gain over the past month.  The patient was started on IV lasix with good clinical results.  Cardiology was consulted to assist.

## 2023-09-17 NOTE — Plan of Care (Signed)
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
## 2023-09-18 DIAGNOSIS — I10 Essential (primary) hypertension: Secondary | ICD-10-CM

## 2023-09-18 DIAGNOSIS — I5020 Unspecified systolic (congestive) heart failure: Secondary | ICD-10-CM

## 2023-09-18 DIAGNOSIS — I5023 Acute on chronic systolic (congestive) heart failure: Secondary | ICD-10-CM | POA: Diagnosis not present

## 2023-09-18 DIAGNOSIS — G4733 Obstructive sleep apnea (adult) (pediatric): Secondary | ICD-10-CM | POA: Diagnosis not present

## 2023-09-18 DIAGNOSIS — E785 Hyperlipidemia, unspecified: Secondary | ICD-10-CM

## 2023-09-18 LAB — BASIC METABOLIC PANEL
Anion gap: 10 (ref 5–15)
BUN: 25 mg/dL — ABNORMAL HIGH (ref 8–23)
CO2: 26 mmol/L (ref 22–32)
Calcium: 8.7 mg/dL — ABNORMAL LOW (ref 8.9–10.3)
Chloride: 99 mmol/L (ref 98–111)
Creatinine, Ser: 1.19 mg/dL (ref 0.61–1.24)
GFR, Estimated: >60 mL/min (ref >60)
Glucose, Bld: 163 mg/dL — ABNORMAL HIGH (ref 70–99)
Potassium: 3.6 mmol/L (ref 3.5–5.1)
Sodium: 135 mmol/L (ref 135–145)

## 2023-09-18 LAB — GLUCOSE, CAPILLARY
Glucose-Capillary: 151 mg/dL — ABNORMAL HIGH (ref 70–99)
Glucose-Capillary: 242 mg/dL — ABNORMAL HIGH (ref 70–99)
Glucose-Capillary: 229 mg/dL — ABNORMAL HIGH (ref 70–99)
Glucose-Capillary: 242 mg/dL — ABNORMAL HIGH (ref 70–99)

## 2023-09-18 LAB — MAGNESIUM: Magnesium: 2.3 mg/dL (ref 1.7–2.4)

## 2023-09-18 MED ORDER — INSULIN ASPART 100 UNIT/ML IJ SOLN
4.0000 [IU] | Freq: Three times a day (TID) | INTRAMUSCULAR | Status: DC
  Administered 2023-09-18 – 2023-09-22 (×11): 4 [IU] via SUBCUTANEOUS
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
## 2023-09-18 NOTE — Progress Notes (Signed)
Mobility Specialist Progress Note:    09/18/23 1114  Mobility  Activity Ambulated with assistance in room;Stood at bedside;Transferred from bed to chair  Level of Assistance Minimal assist, patient does 75% or more  Assistive Device Front wheel walker  Distance Ambulated (ft) 15 ft  Range of Motion/Exercises Active;All extremities  Activity Response Tolerated well  Mobility Referral Yes  Mobility visit 1 Mobility  Mobility Specialist Start Time (ACUTE ONLY) 1045  Mobility Specialist Stop Time (ACUTE ONLY) 1055  Mobility Specialist Time Calculation (min) (ACUTE ONLY) 10 min   Pt received requesting assistance to stand. Family at bedside. Required MinA to stand and CGA to ambulate with RW. Tolerated well, asx throughout. Left pt in chair, MD in room. All needs met.   Lawerance Bach Mobility Specialist Please contact via Special educational needs teacher or  Rehab office at [phone]

## 2023-09-18 NOTE — Plan of Care (Signed)
  Problem: Acute Rehab PT Goals(only PT should resolve) Goal: Pt Will Go Supine/Side To Sit Flowsheets (Taken 09/18/2023 1554) Pt will go Supine/Side to Sit:  with modified independence  with supervision Goal: Patient Will Transfer Sit To/From Stand Flowsheets (Taken 09/18/2023 1554) Patient will transfer sit to/from stand:  with modified independence  with supervision Goal: Pt Will Transfer Bed To Chair/Chair To Bed Flowsheets (Taken 09/18/2023 1554) Pt will Transfer Bed to Chair/Chair to Bed:  with modified independence  with supervision Goal: Pt Will Ambulate Flowsheets (Taken 09/18/2023 1554) Pt will Ambulate:  50 feet  with modified independence  with rolling walker   3:54 PM, 09/18/23 Ocie Bob, MPT Physical Therapist with [facility] [phone] office [phone] mobile phone

## 2023-09-18 NOTE — Evaluation (Signed)
Physical Therapy Evaluation Patient Details Name: Noah Hampton MRN: [id number] DOB: Nov 05, 1950 Today's Date: 09/18/2023  History of Present Illness  Noah Hampton is a 72 y.o. male with hx of heart failure with reduced ejection fraction, recent BiV ICD placement, nonobstructive coronary disease, DVT completed anticoagulation, hypertension, hyperlipidemia, diabetes, obesity, mood disorder, gout, who presents from home due to chest pain and dyspnea.  Reports having nonexertional chest pain located to his upper chest/lower neck worse when leaning over.  Otherwise progressive dyspnea at rest and with exertion that he is not able to clarify further.  Associated lower extremity edema.  Denies any fever, chills, cough/cold symptoms.  He acknowledges skipping doses of his Lasix because he does not like urinating so much.  He has poor understanding of his home medications.  Was seen by cardiology earlier in the day before admission and noted to have 16 pound weight gain over the past month.   Clinical Impression  Patient demonstrates slow labored movement and has to lean on near by objects for support when taking steps without an AD, safer using RW and able to ambulate into hallway without loss of balance, but limited mostly due to fatigue and mild SOB.  Patient tolerated staying up in chair after therapy.  Patient will benefit from continued skilled physical therapy in hospital and recommended venue below to increase strength, balance, endurance for safe ADLs and gait.          If plan is discharge home, recommend the following: A little help with walking and/or transfers;A little help with bathing/dressing/bathroom;Help with stairs or ramp for entrance;Assistance with cooking/housework   Can travel by private vehicle        Equipment Recommendations None recommended by PT  Recommendations for Other Services       Functional Status Assessment Patient has had a recent decline in their functional status  and demonstrates the ability to make significant improvements in function in a reasonable and predictable amount of time.     Precautions / Restrictions Precautions Precautions: Fall Restrictions Weight Bearing Restrictions Per Provider Order: No      Mobility  Bed Mobility Overal bed mobility: Needs Assistance Bed Mobility: Supine to Sit, Sit to Supine     Supine to sit: Modified independent (Device/Increase time) Sit to supine: Supervision   General bed mobility comments: had to use bed rail for pulling self up to sitting    Transfers Overall transfer level: Needs assistance Equipment used: Rolling walker (2 wheels) Transfers: Sit to/from Stand, Bed to chair/wheelchair/BSC Sit to Stand: Supervision   Step pivot transfers: Supervision, Contact guard assist       General transfer comment: slow labored movement having to lean on armrest of chair when not using an AD, safer using RW    Ambulation/Gait Ambulation/Gait assistance: Supervision Gait Distance (Feet): 35 Feet Assistive device: Rolling walker (2 wheels) Gait Pattern/deviations: Decreased step length - right, Decreased step length - left, Decreased stride length Gait velocity: decreased     General Gait Details: slow slightly labored cadence without loss of balance, limited mostly due to fatigue  Stairs            Wheelchair Mobility     Tilt Bed    Modified Rankin (Stroke Patients Only)       Balance Overall balance assessment: Needs assistance Sitting-balance support: Feet supported, No upper extremity supported Sitting balance-Leahy Scale: Good Sitting balance - Comments: seated at EOB   Standing balance support: During functional activity, No upper extremity supported  Standing balance-Leahy Scale: Poor Standing balance comment: fair/poor without AD, fair/good using RW                             Pertinent Vitals/Pain Pain Assessment Pain Assessment: No/denies pain     Home Living Family/patient expects to be discharged to:: Private residence Living Arrangements: Spouse/significant other;Other relatives Available Help at Discharge: Available PRN/intermittently;Family Type of Home: House Home Access: Level entry       Home Layout: Able to live on main level with bedroom/bathroom;Full bath on main level Home Equipment: Agricultural consultant (2 wheels)      Prior Function Prior Level of Function : Independent/Modified Independent;Driving             Mobility Comments: household and short distanced community ambulator using RW PRN ADLs Comments: Assisted by family     Extremity/Trunk Assessment   Upper Extremity Assessment Upper Extremity Assessment: Overall WFL for tasks assessed    Lower Extremity Assessment Lower Extremity Assessment: Generalized weakness    Cervical / Trunk Assessment Cervical / Trunk Assessment: Normal  Communication   Communication Communication: No apparent difficulties  Cognition Arousal: Alert Behavior During Therapy: WFL for tasks assessed/performed Overall Cognitive Status: Within Functional Limits for tasks assessed                                          General Comments      Exercises     Assessment/Plan    PT Assessment Patient needs continued PT services  PT Problem List Decreased strength;Decreased activity tolerance;Decreased balance;Decreased mobility       PT Treatment Interventions DME instruction;Gait training;Stair training;Functional mobility training;Therapeutic activities;Therapeutic exercise;Balance training;Patient/family education    PT Goals (Current goals can be found in the Care Plan section)  Acute Rehab PT Goals Patient Stated Goal: return home with family to assist PT Goal Formulation: With patient Time For Goal Achievement: 10/19/23 Potential to Achieve Goals: Good    Frequency Min 3X/week     Co-evaluation               AM-PAC PT "6  Clicks" Mobility  Outcome Measure Help needed turning from your back to your side while in a flat bed without using bedrails?: None Help needed moving from lying on your back to sitting on the side of a flat bed without using bedrails?: A Little Help needed moving to and from a bed to a chair (including a wheelchair)?: A Little Help needed standing up from a chair using your arms (e.g., wheelchair or bedside chair)?: None Help needed to walk in hospital room?: A Little Help needed climbing 3-5 steps with a railing? : A Little 6 Click Score: 20    End of Session   Activity Tolerance: Patient tolerated treatment well;Patient limited by fatigue Patient left: in chair;with call bell/phone within reach Nurse Communication: Mobility status PT Visit Diagnosis: Unsteadiness on feet (R26.81);Other abnormalities of gait and mobility (R26.89)    Time: 1510-1541 PT Time Calculation (min) (ACUTE ONLY): 31 min   Charges:   PT Evaluation $PT Eval Moderate Complexity: 1 Mod PT Treatments $Therapeutic Activity: 23-37 mins PT General Charges $$ ACUTE PT VISIT: 1 Visit         3:52 PM, 09/18/23 Ocie Bob, MPT Physical Therapist with [facility] [phone] office [phone] mobile phone

## 2023-09-18 NOTE — Progress Notes (Signed)
PROGRESS NOTE   Noah Hampton  ZHY:[id number] DOB: 1951-04-09 DOA: 09/16/2023 PCP: Amedeo Plenty, DO   Chief Complaint  Patient presents with   Chest Pain   Level of care: Telemetry  Brief Admission History:  72 y.o. male with hx of heart failure with reduced ejection fraction, recent BiV ICD placement, nonobstructive coronary disease, DVT completed anticoagulation, hypertension, hyperlipidemia, diabetes, obesity, mood disorder, gout, who presents from home due to chest pain and dyspnea.  Reports having nonexertional chest pain located to his upper chest/lower neck worse when leaning over.  Otherwise progressive dyspnea at rest and with exertion that he is not able to clarify further.  Associated lower extremity edema.  Denies any fever, chills, cough/cold symptoms.  He acknowledges skipping doses of his Lasix because he does not like urinating so much.  He has poor understanding of his home medications.  Was seen by cardiology earlier in the day before admission and noted to have 16 pound weight gain over the past month.    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 and has been on GDMT - fortunately his LVEF improved to 30-35% 9/24 - he presented with dyspnea and 16# weight gain  - his dry weight reportedly has been 251#  - he is being treated with IV furosemide 40 mg daily  - monitor I/O and weights - monitoring electrolytes and renal function  - repeat TTE was ordered and fortunately improved: LVEF 45-50% with global hypokinesis; indeterminate diastolic function; moderate pericardial effusion with no signs of tamponade  - obtain high quality ReDs vest reading daily; asking staff to do this daily but not done yet  - inpatient cardiology team to see 12/26 -requested PT eval to assess strength and ambulation Filed Weights   09/17/23 0314 09/17/23 1129 09/18/23 0500  Weight: 117.7 kg 116 kg 116.1 kg    Intake/Output Summary (Last 24 hours) at 09/18/2023 1110 Last  data filed at 09/18/2023 0900 Gross per 24 hour  Intake 705 ml  Output 550 ml  Net 155 ml   Atypical chest pain symptoms - symptoms more consistent with GI  - treated with pantoprazole and GI cocktail - ACS ruled out  - high sensitive troponin tests reassuring - pt had a cardiac cath 4/24 with findings of mild to mod nonobstructive CAD   History of DVT - he completed 6 months of DOAC - he was taken off DOAC by Dr. Jenene Slicker recently - he is on DVT prevention chemoprophylaxis  Essential hypertension  - his GDMT medications have been restarted - his blood pressures are controlled - following closely  Hyperlipidemia  - pt is being treated with repatha   Type 2 DM with vascular complications - follow up A1c testing  - SSI coverage ordered - we resumed home dapagliflozin 10 mg daily   Anxiety/Depression - resumed home escitalopram 10 mg daily   GERD - pantoprazole 40 mg daily ordered   OSA - will offer nightly CPAP with auto-titration while in hospital   DVT prophylaxis: enoxaparin  Code Status: Full  Family Communication: discussed plan of care with daughter telephone 12/26   Disposition: anticipate home with home health services    Consultants:  Cardiology  Procedures:   Antimicrobials:    Subjective: Pt still getting very SOB with ambulation.  He has been able to shower and ambulate in room but becomes very "winded"  He denies chest pain and palpitations.   Objective: Vitals:   09/17/23 1300 09/17/23 2106  09/18/23 0500 09/18/23 0800  BP:  110/70  132/72  Pulse:  90  83  Resp: (!) 22 20    Temp:  98.4 F (36.9 C)    TempSrc:      SpO2: 94% 95%  92%  Weight:   116.1 kg   Height:        Intake/Output Summary (Last 24 hours) at 09/18/2023 1106 Last data filed at 09/18/2023 0900 Gross per 24 hour  Intake 705 ml  Output 550 ml  Net 155 ml   Filed Weights   09/17/23 0314 09/17/23 1129 09/18/23 0500  Weight: 117.7 kg 116 kg 116.1 kg    Examination:  General exam: chronically ill appearing male.  Appears calm and comfortable  Respiratory system: Clear to auscultation. Respiratory effort normal. Cardiovascular system: normal S1 & S2 heard. No JVD, murmurs, rubs, gallops or clicks. No pedal edema. Gastrointestinal system: Abdomen is obese, soft and nontender. No organomegaly or masses felt. Normal bowel sounds heard. Central nervous system: Alert and oriented. No focal neurological deficits. Extremities: trace pretibial edema BLEs.  Symmetric 5 x 5 power. Skin: No rashes, lesions or ulcers. Psychiatry: Judgement and insight appear normal. Mood & affect appropriate.   Data Reviewed: I have personally reviewed following labs and imaging studies  CBC: Recent Labs  Lab 09/16/23 2024 09/17/23 0516  WBC 10.6* 10.7*  HGB 14.9 14.8  HCT 46.9 47.6  MCV 95.5 95.2  PLT 141* 148*    Basic Metabolic Panel: Recent Labs  Lab 09/16/23 2024 09/17/23 0516 09/18/23 0814  NA 135 136 135  K 4.1 3.9 3.6  CL 104 98 99  CO2 22 27 26   GLUCOSE 214* 212* 163*  BUN 16 17 25*  CREATININE 0.98 1.14 1.19  CALCIUM 9.2 9.2 8.7*  MG  --  2.0 2.3  PHOS  --  3.7  --     CBG: Recent Labs  Lab 09/17/23 0743 09/17/23 1144 09/17/23 1700 09/17/23 2109 09/18/23 0733  GLUCAP 197* 257* 205* 189* 151*    Recent Results (from the past 240 hours)  Respiratory (~20 pathogens) panel by PCR     Status: None   Collection Time: 09/17/23  2:42 AM   Specimen: Nasopharyngeal Swab; Respiratory  Result Value Ref Range Status   Adenovirus NOT DETECTED NOT DETECTED Final   Coronavirus 229E NOT DETECTED NOT DETECTED Final    Comment: (NOTE) The Coronavirus on the Respiratory Panel, DOES NOT test for the novel  Coronavirus (2019 nCoV)    Coronavirus HKU1 NOT DETECTED NOT DETECTED Final   Coronavirus NL63 NOT DETECTED NOT DETECTED Final   Coronavirus OC43 NOT DETECTED NOT DETECTED Final   Metapneumovirus NOT DETECTED NOT DETECTED Final    Rhinovirus / Enterovirus NOT DETECTED NOT DETECTED Final   Influenza A NOT DETECTED NOT DETECTED Final   Influenza B NOT DETECTED NOT DETECTED Final   Parainfluenza Virus 1 NOT DETECTED NOT DETECTED Final   Parainfluenza Virus 2 NOT DETECTED NOT DETECTED Final   Parainfluenza Virus 3 NOT DETECTED NOT DETECTED Final   Parainfluenza Virus 4 NOT DETECTED NOT DETECTED Final   Respiratory Syncytial Virus NOT DETECTED NOT DETECTED Final   Bordetella pertussis NOT DETECTED NOT DETECTED Final   Bordetella Parapertussis NOT DETECTED NOT DETECTED Final   Chlamydophila pneumoniae NOT DETECTED NOT DETECTED Final   Mycoplasma pneumoniae NOT DETECTED NOT DETECTED Final    Comment: Performed at [facility], [address] [place], [place] [address]  MRSA Next Gen by  PCR, Nasal     Status: None   Collection Time: 09/17/23  4:52 AM   Specimen: Nasal Mucosa; Nasal Swab  Result Value Ref Range Status   MRSA by PCR Next Gen NOT DETECTED NOT DETECTED Final    Comment: (NOTE) The GeneXpert MRSA Assay (FDA approved for NASAL specimens only), is one component of a comprehensive MRSA colonization surveillance program. It is not intended to diagnose MRSA infection nor to guide or monitor treatment for MRSA infections. Test performance is not FDA approved in patients less than 52 years old. Performed at [facility], [address], [place], [place] [address]      Radiology Studies: ECHOCARDIOGRAM COMPLETE Result Date: 09/17/2023    ECHOCARDIOGRAM REPORT   Patient Name:   Noah Hampton Date of Exam: 09/17/2023 Medical Rec #:  [id number]   Height:       68.0 in Accession #:    [id number]  Weight:       259.5 lb Date of Birth:  01/12/1951  BSA:          2.283 m Patient Age:    72 years    BP:           134/62 mmHg Patient Gender: M           HR:           88 bpm. [facility] Procedure: 2D Echo, Cardiac Doppler, Color Doppler and Intracardiac            Opacification Agent Indications:     Congestive Heart Failure I50.9  History:        Patient has prior history of Echocardiogram examinations, most                 recent 05/27/2023. CHF, CAD and Previous Myocardial Infarction;                 Risk Factors:Hypertension, Diabetes, Dyslipidemia, Sleep Apnea                 and Non-Smoker.  Sonographer:    Celesta Gentile RCS Referring Phys: [id number] JONATHAN SEGARS IMPRESSIONS  1. Left ventricular ejection fraction, by estimation, is 45 to 50%. The left ventricle has mildly decreased function. The left ventricle demonstrates global hypokinesis. There is mild concentric left ventricular hypertrophy. Indeterminate diastolic filling due to E-A fusion.  2. Right ventricular systolic function is normal. The right ventricular size is normal. Tricuspid regurgitation signal is inadequate for assessing PA pressure.  3. Moderate pericardial effusion. The pericardial effusion is circumferential. There is no evidence of cardiac tamponade.  4. The mitral valve was not well visualized. No evidence of mitral valve regurgitation.  5. The aortic valve is tricuspid. There is mild calcification of the aortic valve. Aortic valve regurgitation is not visualized.  6. The inferior vena cava is dilated in size with >50% respiratory variability, suggesting right atrial pressure of 8 mmHg. Conclusion(s)/Recommendation(s): EF has improved compared to prior study. Has pericardial effusion, no echocardiographic signs of cardiac tamponade. FINDINGS  Left Ventricle: Left ventricular ejection fraction, by estimation, is 45 to 50%. The left ventricle has mildly decreased function. The left ventricle demonstrates global hypokinesis. Definity contrast agent was given IV to delineate the left ventricular  endocardial borders. The left ventricular internal cavity size was normal in size. There is mild concentric left ventricular hypertrophy. Abnormal (paradoxical) septal motion, consistent with RV pacemaker. Indeterminate diastolic filling due  to E-A fusion. Right Ventricle: The right ventricular size is  normal. Right ventricular systolic function is normal. Tricuspid regurgitation signal is inadequate for assessing PA pressure. Left Atrium: Left atrial size was normal in size. Right Atrium: Right atrial size was normal in size. Pericardium: A moderately sized pericardial effusion is present. The pericardial effusion is circumferential. There is no evidence of cardiac tamponade. Mitral Valve: The mitral valve was not well visualized. No evidence of mitral valve regurgitation. Tricuspid Valve: Tricuspid valve regurgitation is not demonstrated. Aortic Valve: The aortic valve is tricuspid. There is mild calcification of the aortic valve. Aortic valve regurgitation is not visualized. Pulmonic Valve: Pulmonic valve regurgitation is not visualized. Aorta: The aortic root is normal in size and structure. Venous: The inferior vena cava is dilated in size with greater than 50% respiratory variability, suggesting right atrial pressure of 8 mmHg. IAS/Shunts: The interatrial septum was not well visualized.  LEFT VENTRICLE PLAX 2D LVIDd:         4.30 cm   Diastology LVIDs:         3.10 cm   LV e' medial:    7.07 cm/s LV PW:         1.30 cm   LV E/e' medial:  8.7 LV IVS:        1.20 cm   LV e' lateral:   5.87 cm/s LVOT diam:     2.10 cm   LV E/e' lateral: 10.5 LV SV:         56 LV SV Index:   24 LVOT Area:     3.46 cm  RIGHT VENTRICLE RV S prime:     14.90 cm/s TAPSE (M-mode): 2.1 cm LEFT ATRIUM             Index        RIGHT ATRIUM           Index LA diam:        3.40 cm 1.49 cm/m   RA Area:     15.70 cm LA Vol (A2C):   44.7 ml 19.58 ml/m  RA Volume:   36.00 ml  15.77 ml/m LA Vol (A4C):   49.6 ml 21.73 ml/m LA Biplane Vol: 48.8 ml 21.38 ml/m  AORTIC VALVE LVOT Vmax:   90.43 cm/s LVOT Vmean:  60.767 cm/s LVOT VTI:    0.160 m  AORTA Ao Root diam: 3.30 cm MITRAL VALVE MV Area (PHT): 4.68 cm    SHUNTS MV Decel Time: 162 msec    Systemic VTI:  0.16 m MV E velocity:  61.80 cm/s  Systemic Diam: 2.10 cm MV A velocity: 81.00 cm/s MV E/A ratio:  0.76 Photographer signed by Carolan Clines Signature Date/Time: 09/17/2023/1:35:39 PM    Final    [place] ASCITES (ABDOMEN LIMITED) Result Date: 09/17/2023 CLINICAL DATA:  Abdominal distention Acute heart failure EXAM: LIMITED ABDOMEN ULTRASOUND FOR ASCITES TECHNIQUE: Limited ultrasound survey for ascites was performed in all four abdominal quadrants. COMPARISON:  None available FINDINGS: No significant ascites identified within the abdomen or pelvis. IMPRESSION: No significant ascites. Electronically Signed   By: Acquanetta Belling M.D.   On: 09/17/2023 13:33   CT Angio Chest PE W and/or Wo Contrast Result Date: 09/16/2023 CLINICAL DATA:  Chest pain EXAM: CT ANGIOGRAPHY CHEST WITH CONTRAST TECHNIQUE: Multidetector CT imaging of the chest was performed using the standard protocol during bolus administration of intravenous contrast. Multiplanar CT image reconstructions and MIPs were obtained to evaluate the vascular anatomy. RADIATION DOSE REDUCTION: This exam was performed according to the departmental dose-optimization program which  includes automated exposure control, adjustment of the mA and/or kV according to patient size and/or use of iterative reconstruction technique. CONTRAST:  75mL OMNIPAQUE IOHEXOL 350 MG/ML SOLN COMPARISON:  11/09/2022 FINDINGS: Cardiovascular: No filling defects in the pulmonary arteries to suggest pulmonary emboli. Cardiomegaly. Pacer wires in the right heart. Small to moderate pericardial effusion. Aorta normal caliber. Scattered coronary artery and aortic calcifications. Mediastinum/Nodes: No mediastinal, hilar, or axillary adenopathy. Trachea and esophagus are unremarkable. Thyroid unremarkable. Lungs/Pleura: Bibasilar scarring or atelectasis. No confluent opacities, apart edema, or effusions. Upper Abdomen: No acute findings Musculoskeletal: Chest wall soft tissues are unremarkable. No acute bony  abnormality. Review of the MIP images confirms the above findings. IMPRESSION: No evidence of pulmonary embolus. Cardiomegaly.  Small to moderate pericardial effusion. Coronary artery disease. Bibasilar atelectasis or scarring. Aortic Atherosclerosis (ICD10-I70.0). Electronically Signed   By: Charlett Nose M.D.   On: 09/16/2023 21:31   DG Chest Port 1 View Result Date: 09/16/2023 CLINICAL DATA:  Chest pain. EXAM: PORTABLE CHEST 1 VIEW COMPARISON:  08/08/2023. FINDINGS: Bilateral lung fields are clear. Bilateral costophrenic angles are clear. Stable cardio-mediastinal silhouette. There is a left sided 3-lead pacemaker. No acute osseous abnormalities. Left lateral clavicular ORIF noted. The soft tissues are within normal limits. IMPRESSION: No active disease. Electronically Signed   By: Jules Schick M.D.   On: 09/16/2023 20:48   Scheduled Meds:  aspirin EC  81 mg Oral Daily   carvedilol  6.25 mg Oral BID WC   dapagliflozin propanediol  10 mg Oral Daily   enoxaparin (LOVENOX) injection  40 mg Subcutaneous Q24H   escitalopram  10 mg Oral Daily   furosemide  40 mg Intravenous Daily   influenza vaccine adjuvanted  0.5 mL Intramuscular Tomorrow-1000   insulin aspart  0-6 Units Subcutaneous TID WC   pantoprazole  40 mg Oral Q0600   sacubitril-valsartan  1 tablet Oral BID   sodium chloride flush  3 mL Intravenous Q12H   spironolactone  25 mg Oral Daily   Continuous Infusions:   LOS: 1 day   Time spent: 57 mins  Devon Pretty Laural Benes, MD How to contact the [facility] Attending or Consulting provider 7A - 7P or covering provider during after hours 7P -7A, for this patient?  Check the care team in [facility] and look for a) attending/consulting [facility] provider listed and b) the [facility] team listed Log into [email or web address] to find provider on call.  Locate the [facility] provider you are looking for under [facility] and page to a number that you can be directly reached. If you still have difficulty reaching the provider,  please page the [facility] (Director on Call) for the Hospitalists listed on amion for assistance.  09/18/2023, 11:06 AM

## 2023-09-18 NOTE — Progress Notes (Signed)
Pt refuses urinal despite reiterating purpose of strict I&o's. Misses hat each time patient goes to urinate in commode and has incontinent episodes at times. Informed day-shift nurse of concern.

## 2023-09-19 ENCOUNTER — Other Ambulatory Visit (HOSPITAL_COMMUNITY): Payer: Medicare Other

## 2023-09-19 DIAGNOSIS — I5023 Acute on chronic systolic (congestive) heart failure: Secondary | ICD-10-CM | POA: Diagnosis not present

## 2023-09-19 DIAGNOSIS — I5020 Unspecified systolic (congestive) heart failure: Secondary | ICD-10-CM | POA: Diagnosis not present

## 2023-09-19 DIAGNOSIS — E1165 Type 2 diabetes mellitus with hyperglycemia: Secondary | ICD-10-CM | POA: Diagnosis not present

## 2023-09-19 DIAGNOSIS — E66812 Obesity, class 2: Secondary | ICD-10-CM | POA: Insufficient documentation

## 2023-09-19 LAB — BASIC METABOLIC PANEL
Anion gap: 9 (ref 5–15)
BUN: 38 mg/dL — ABNORMAL HIGH (ref 8–23)
CO2: 25 mmol/L (ref 22–32)
Calcium: 8.7 mg/dL — ABNORMAL LOW (ref 8.9–10.3)
Chloride: 100 mmol/L (ref 98–111)
Creatinine, Ser: 1.06 mg/dL (ref 0.61–1.24)
GFR, Estimated: >60 mL/min (ref >60)
Glucose, Bld: 191 mg/dL — ABNORMAL HIGH (ref 70–99)
Potassium: 3.8 mmol/L (ref 3.5–5.1)
Sodium: 134 mmol/L — ABNORMAL LOW (ref 135–145)

## 2023-09-19 LAB — GLUCOSE, CAPILLARY
Glucose-Capillary: 137 mg/dL — ABNORMAL HIGH (ref 70–99)
Glucose-Capillary: 264 mg/dL — ABNORMAL HIGH (ref 70–99)
Glucose-Capillary: 147 mg/dL — ABNORMAL HIGH (ref 70–99)
Glucose-Capillary: 178 mg/dL — ABNORMAL HIGH (ref 70–99)

## 2023-09-19 LAB — HEMOGLOBIN A1C
Hgb A1c MFr Bld: 8.4 % — ABNORMAL HIGH (ref 4.8–5.6)
Mean Plasma Glucose: 194 mg/dL

## 2023-09-19 NOTE — Progress Notes (Addendum)
PROGRESS NOTE  Noah Hampton WUJ:[id number] DOB: 1950-11-11 DOA: 09/16/2023 PCP: Amedeo Plenty, DO  Brief History:  72 y.o. male with hx of heart failure with reduced ejection fraction, recent BiV ICD placement, nonobstructive coronary disease, DVT completed anticoagulation, hypertension, hyperlipidemia, diabetes, obesity, mood disorder, gout, who presents from home due to chest pain and dyspnea.  Reports having nonexertional chest pain located to his upper chest/lower neck worse when leaning over.  Otherwise progressive dyspnea at rest and with exertion that he is not able to clarify further.  Associated lower extremity edema.  Denies any fever, chills, cough/cold symptoms.  He acknowledges skipping doses of his Lasix because he does not like urinating so much.  He has poor understanding of his home medications.  Was seen by cardiology earlier in the day before admission and noted to have 16 pound weight gain over the past month.    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 -no device shocks  Atypical chest pain symptoms - symptoms more consistent with GI  - treated with pantoprazole and GI cocktail - ACS ruled out  - high sensitive troponin tests reassuring - pt had a cardiac cath 4/24 with findings of mild to mod nonobstructive CAD    History of  DVT - he completed 6 months of DOAC - he was taken off DOAC by Dr. Jenene Slicker recently - he is on DVT prevention chemoprophylaxis   Essential hypertension  - his GDMT medications have been restarted - his blood pressures are controlled - following closely   Hyperlipidemia  - has myalgias with statins - treated with repatha    Type 2 DM with vascular complications - 09/17/23 A1V--8.4 - SSI coverage ordered - on Ozempic as outpt - resumed home dapagliflozin 10 mg daily    Anxiety/Depression - resumed home escitalopram 10 mg daily  -continue clonazepam 0.5 mg bid   GERD - pantoprazole 40 mg daily ordered    OSA - will offer nightly CPAP with auto-titration while in hospital       Family Communication: no  Family at bedside  Consultants:  cardiology  Code Status:  FULL   DVT Prophylaxis: [place] Lovenox   Procedures: As Listed in Progress Note Above  Antibiotics: None    Subjective: Patient denies fevers, chills, headache, chest pain, dyspnea, nausea, vomiting, diarrhea, abdominal pain, dysuria, hematuria, hematochezia, and melena.   Objective: Vitals:   09/18/23 1407 09/18/23 2058 09/19/23 0349 09/19/23 1437  BP: 111/71 117/68 124/66 105/62  Pulse: 81 70 83 85  Resp:  (!) 24 20 19   Temp: 98 F (36.7 C) 98.3 F (36.8 C) 99 F (37.2 C) 98.5 F (36.9 C)  TempSrc: Oral  Oral   SpO2: 92% 93% 91% 90%  Weight:   117 kg   Height:        Intake/Output Summary (Last  24 hours) at 09/19/2023 1722 Last data filed at 09/19/2023 1348 Gross per 24 hour  Intake 720 ml  Output 2100 ml  Net -1380 ml   Weight change: 1 kg Exam:  General:  Pt is alert, follows commands appropriately, not in acute distress HEENT: No icterus, No thrush, No neck mass, [place]/AT Cardiovascular: RRR, S1/S2, no rubs, no gallops Respiratory: fine bibasilar crackles. No wheeeze Abdomen: Soft/+BS, non tender, non distended, no guarding Extremities: trace LE edema, No lymphangitis, No  petechiae, No rashes, no synovitis   Data Reviewed: I have personally reviewed following labs and imaging studies Basic Metabolic Panel: Recent Labs  Lab 09/16/23 2024 09/17/23 0516 09/18/23 0814 09/19/23 0429  NA 135 136 135 134*  K 4.1 3.9 3.6 3.8  CL 104 98 99 100  CO2 22 27 26 25   GLUCOSE 214* 212* 163* 191*  BUN 16 17 25* 38*  CREATININE 0.98 1.14 1.19 1.06  CALCIUM 9.2 9.2 8.7* 8.7*  MG  --  2.0 2.3  --   PHOS  --  3.7  --   --    Liver Function Tests: Recent Labs  Lab 09/16/23 2024  AST 15  ALT 19  ALKPHOS 61  BILITOT 0.9  PROT 7.1  ALBUMIN 3.5   Recent Labs  Lab 09/16/23 2244  LIPASE 31   No results for input(s): "AMMONIA" in the last 168 hours. Coagulation Profile: No results for input(s): "INR", "PROTIME" in the last 168 hours. CBC: Recent Labs  Lab 09/16/23 2024 09/17/23 0516  WBC 10.6* 10.7*  HGB 14.9 14.8  HCT 46.9 47.6  MCV 95.5 95.2  PLT 141* 148*   Cardiac Enzymes: No results for input(s): "CKTOTAL", "CKMB", "CKMBINDEX", "TROPONINI" in the last 168 hours. BNP: Invalid input(s): "POCBNP" CBG: Recent Labs  Lab 09/18/23 1635 09/18/23 2100 09/19/23 0752 09/19/23 1130 09/19/23 1630  GLUCAP 229* 242* 137* 264* 147*   HbA1C: Recent Labs    09/17/23 0516  HGBA1C 8.4*   Urine analysis:    Component Value Date/Time   COLORURINE STRAW (A) 09/16/2023 2230   APPEARANCEUR CLEAR 09/16/2023 2230   LABSPEC 1.012 09/16/2023 2230   PHURINE 5.0 09/16/2023 2230   GLUCOSEU >=500 (A) 09/16/2023 2230   HGBUR SMALL (A) 09/16/2023 2230   BILIRUBINUR NEGATIVE 09/16/2023 2230   KETONESUR NEGATIVE 09/16/2023 2230   PROTEINUR 30 (A) 09/16/2023 2230   NITRITE NEGATIVE 09/16/2023 2230   LEUKOCYTESUR NEGATIVE 09/16/2023 2230   Sepsis Labs: @LABRCNTIP (procalcitonin:4,lacticidven:4) ) Recent Results (from the past 240 hours)  Respiratory (~20 pathogens) panel by PCR     Status: None   Collection Time: 09/17/23  2:42 AM   Specimen:  Nasopharyngeal Swab; Respiratory  Result Value Ref Range Status   Adenovirus NOT DETECTED NOT DETECTED Final   Coronavirus 229E NOT DETECTED NOT DETECTED Final    Comment: (NOTE) The Coronavirus on the Respiratory Panel, DOES NOT test for the novel  Coronavirus (2019 nCoV)    Coronavirus HKU1 NOT DETECTED NOT DETECTED Final   Coronavirus NL63 NOT DETECTED NOT DETECTED Final   Coronavirus OC43 NOT DETECTED NOT DETECTED Final   Metapneumovirus NOT DETECTED NOT DETECTED Final   Rhinovirus / Enterovirus NOT DETECTED NOT DETECTED Final   Influenza A NOT DETECTED NOT DETECTED Final   Influenza B NOT DETECTED NOT DETECTED Final   Parainfluenza Virus 1 NOT DETECTED NOT DETECTED Final   Parainfluenza Virus 2 NOT DETECTED NOT DETECTED Final   Parainfluenza Virus 3 NOT DETECTED NOT DETECTED Final   Parainfluenza  Virus 4 NOT DETECTED NOT DETECTED Final   Respiratory Syncytial Virus NOT DETECTED NOT DETECTED Final   Bordetella pertussis NOT DETECTED NOT DETECTED Final   Bordetella Parapertussis NOT DETECTED NOT DETECTED Final   Chlamydophila pneumoniae NOT DETECTED NOT DETECTED Final   Mycoplasma pneumoniae NOT DETECTED NOT DETECTED Final    Comment: Performed at [facility], [address], [place], [place] [address]  MRSA Next Gen by PCR, Nasal     Status: None   Collection Time: 09/17/23  4:52 AM   Specimen: Nasal Mucosa; Nasal Swab  Result Value Ref Range Status   MRSA by PCR Next Gen NOT DETECTED NOT DETECTED Final    Comment: (NOTE) The GeneXpert MRSA Assay (FDA approved for NASAL specimens only), is one component of a comprehensive MRSA colonization surveillance program. It is not intended to diagnose MRSA infection nor to guide or monitor treatment for MRSA infections. Test performance is not FDA approved in patients less than 62 years old. Performed at [facility], [address] [place], [place] [address]      Scheduled Meds:  aspirin EC  81 mg Oral Daily   carvedilol   6.25 mg Oral BID WC   dapagliflozin propanediol  10 mg Oral Daily   enoxaparin (LOVENOX) injection  40 mg Subcutaneous Q24H   escitalopram  10 mg Oral Daily   furosemide  40 mg Intravenous Daily   influenza vaccine adjuvanted  0.5 mL Intramuscular Tomorrow-1000   insulin aspart  0-6 Units Subcutaneous TID WC   insulin aspart  4 Units Subcutaneous TID WC   pantoprazole  40 mg Oral Q0600   sacubitril-valsartan  1 tablet Oral BID   sodium chloride flush  3 mL Intravenous Q12H   spironolactone  25 mg Oral Daily   Continuous Infusions:  Procedures/Studies: ECHOCARDIOGRAM COMPLETE Result Date: 09/17/2023    ECHOCARDIOGRAM REPORT   Patient Name:   CYRIL PROBUS Date of Exam: 09/17/2023 Medical Rec #:  [id number]   Height:       68.0 in Accession #:    [id number]  Weight:       259.5 lb Date of Birth:  [birth year]  BSA:          2.283 m Patient Age:    72 years    BP:           134/62 mmHg Patient Gender: M           HR:           88 bpm. [facility] Procedure: 2D Echo, Cardiac Doppler, Color Doppler and Intracardiac            Opacification Agent Indications:    Congestive Heart Failure I50.9  History:        Patient has prior history of Echocardiogram examinations, most                 recent 05/27/2023. CHF, CAD and Previous Myocardial Infarction;                 Risk Factors:Hypertension, Diabetes, Dyslipidemia, Sleep Apnea                 and Non-Smoker.  Sonographer:    Celesta Gentile RCS Referring Phys: [id number] JONATHAN SEGARS IMPRESSIONS  1. Left ventricular ejection fraction, by estimation, is 45 to 50%. The left ventricle has mildly decreased function. The left ventricle demonstrates global hypokinesis. There is mild concentric left ventricular hypertrophy. Indeterminate diastolic filling due to  E-A fusion.  2. Right ventricular systolic function is normal. The right ventricular size is normal. Tricuspid regurgitation signal is inadequate for assessing PA pressure.  3. Moderate  pericardial effusion. The pericardial effusion is circumferential. There is no evidence of cardiac tamponade.  4. The mitral valve was not well visualized. No evidence of mitral valve regurgitation.  5. The aortic valve is tricuspid. There is mild calcification of the aortic valve. Aortic valve regurgitation is not visualized.  6. The inferior vena cava is dilated in size with >50% respiratory variability, suggesting right atrial pressure of 8 mmHg. Conclusion(s)/Recommendation(s): EF has improved compared to prior study. Has pericardial effusion, no echocardiographic signs of cardiac tamponade. FINDINGS  Left Ventricle: Left ventricular ejection fraction, by estimation, is 45 to 50%. The left ventricle has mildly decreased function. The left ventricle demonstrates global hypokinesis. Definity contrast agent was given IV to delineate the left ventricular  endocardial borders. The left ventricular internal cavity size was normal in size. There is mild concentric left ventricular hypertrophy. Abnormal (paradoxical) septal motion, consistent with RV pacemaker. Indeterminate diastolic filling due to E-A fusion. Right Ventricle: The right ventricular size is normal. Right ventricular systolic function is normal. Tricuspid regurgitation signal is inadequate for assessing PA pressure. Left Atrium: Left atrial size was normal in size. Right Atrium: Right atrial size was normal in size. Pericardium: A moderately sized pericardial effusion is present. The pericardial effusion is circumferential. There is no evidence of cardiac tamponade. Mitral Valve: The mitral valve was not well visualized. No evidence of mitral valve regurgitation. Tricuspid Valve: Tricuspid valve regurgitation is not demonstrated. Aortic Valve: The aortic valve is tricuspid. There is mild calcification of the aortic valve. Aortic valve regurgitation is not visualized. Pulmonic Valve: Pulmonic valve regurgitation is not visualized. Aorta: The aortic root  is normal in size and structure. Venous: The inferior vena cava is dilated in size with greater than 50% respiratory variability, suggesting right atrial pressure of 8 mmHg. IAS/Shunts: The interatrial septum was not well visualized.  LEFT VENTRICLE PLAX 2D LVIDd:         4.30 cm   Diastology LVIDs:         3.10 cm   LV e' medial:    7.07 cm/s LV PW:         1.30 cm   LV E/e' medial:  8.7 LV IVS:        1.20 cm   LV e' lateral:   5.87 cm/s LVOT diam:     2.10 cm   LV E/e' lateral: 10.5 LV SV:         56 LV SV Index:   24 LVOT Area:     3.46 cm  RIGHT VENTRICLE RV S prime:     14.90 cm/s TAPSE (M-mode): 2.1 cm LEFT ATRIUM             Index        RIGHT ATRIUM           Index LA diam:        3.40 cm 1.49 cm/m   RA Area:     15.70 cm LA Vol (A2C):   44.7 ml 19.58 ml/m  RA Volume:   36.00 ml  15.77 ml/m LA Vol (A4C):   49.6 ml 21.73 ml/m LA Biplane Vol: 48.8 ml 21.38 ml/m  AORTIC VALVE LVOT Vmax:   90.43 cm/s LVOT Vmean:  60.767 cm/s LVOT VTI:    0.160 m  AORTA Ao Root diam: 3.30 cm MITRAL  VALVE MV Area (PHT): 4.68 cm    SHUNTS MV Decel Time: 162 msec    Systemic VTI:  0.16 m MV E velocity: 61.80 cm/s  Systemic Diam: 2.10 cm MV A velocity: 81.00 cm/s MV E/A ratio:  0.76 [place] signed by Carolan Clines Signature Date/Time: 09/17/2023/1:35:39 PM    Final    [place] ASCITES (ABDOMEN LIMITED) Result Date: 09/17/2023 CLINICAL DATA:  Abdominal distention Acute heart failure EXAM: LIMITED ABDOMEN ULTRASOUND FOR ASCITES TECHNIQUE: Limited ultrasound survey for ascites was performed in all four abdominal quadrants. COMPARISON:  None available FINDINGS: No significant ascites identified within the abdomen or pelvis. IMPRESSION: No significant ascites. Electronically Signed   By: Acquanetta Belling M.D.   On: 09/17/2023 13:33   CT Angio Chest PE W and/or Wo Contrast Result Date: 09/16/2023 CLINICAL DATA:  Chest pain EXAM: CT ANGIOGRAPHY CHEST WITH CONTRAST TECHNIQUE: Multidetector CT imaging of the chest was  performed using the standard protocol during bolus administration of intravenous contrast. Multiplanar CT image reconstructions and MIPs were obtained to evaluate the vascular anatomy. RADIATION DOSE REDUCTION: This exam was performed according to the departmental dose-optimization program which includes automated exposure control, adjustment of the mA and/or kV according to patient size and/or use of iterative reconstruction technique. CONTRAST:  75mL OMNIPAQUE IOHEXOL 350 MG/ML SOLN COMPARISON:  11/09/2022 FINDINGS: Cardiovascular: No filling defects in the pulmonary arteries to suggest pulmonary emboli. Cardiomegaly. Pacer wires in the right heart. Small to moderate pericardial effusion. Aorta normal caliber. Scattered coronary artery and aortic calcifications. Mediastinum/Nodes: No mediastinal, hilar, or axillary adenopathy. Trachea and esophagus are unremarkable. Thyroid unremarkable. Lungs/Pleura: Bibasilar scarring or atelectasis. No confluent opacities, apart edema, or effusions. Upper Abdomen: No acute findings Musculoskeletal: Chest wall soft tissues are unremarkable. No acute bony abnormality. Review of the MIP images confirms the above findings. IMPRESSION: No evidence of pulmonary embolus. Cardiomegaly.  Small to moderate pericardial effusion. Coronary artery disease. Bibasilar atelectasis or scarring. Aortic Atherosclerosis (ICD10-I70.0). Electronically Signed   By: Charlett Nose M.D.   On: 09/16/2023 21:31   DG Chest Port 1 View Result Date: 09/16/2023 CLINICAL DATA:  Chest pain. EXAM: PORTABLE CHEST 1 VIEW COMPARISON:  08/08/2023. FINDINGS: Bilateral lung fields are clear. Bilateral costophrenic angles are clear. Stable cardio-mediastinal silhouette. There is a left sided 3-lead pacemaker. No acute osseous abnormalities. Left lateral clavicular ORIF noted. The soft tissues are within normal limits. IMPRESSION: No active disease. Electronically Signed   By: Jules Schick M.D.   On: 09/16/2023  20:48   CUP PACEART INCLINIC DEVICE CHECK Result Date: 08/28/2023 Wound check appointment. Steri-strips removed. Wound without redness or edema. Incision edges approximated, wound well healed. Normal device function. Thresholds, sensing, and impedances consistent with implant measurements. Device programmed at 3.5V for  extra safety margin until 3 month visit. Histogram distribution appropriate for patient and level of activity. No mode switches or ventricular arrhythmias noted. Patient educated about wound care, arm mobility, lifting restrictions, shock plan. ROV in 3  months with implanting physician.Ancil Boozer, BSN, RN   Catarina Hartshorn, DO  [facility]  If 7PM-7AM, please contact night-coverage [email or web address] Password [email or web address] 09/19/2023, 5:22 PM   LOS: 2 days

## 2023-09-19 NOTE — Progress Notes (Signed)
Mobility Specialist Progress Note:    09/19/23 1402  Mobility  Activity Ambulated with assistance in hallway;Ambulated with assistance to bathroom;Transferred from bed to chair  Level of Assistance Contact guard assist, steadying assist  Assistive Device Front wheel walker  Distance Ambulated (ft) 100 ft  Range of Motion/Exercises Active;All extremities  Activity Response Tolerated well  Mobility Referral Yes  Mobility visit 1 Mobility  Mobility Specialist Start Time (ACUTE ONLY) 1330  Mobility Specialist Stop Time (ACUTE ONLY) 1400  Mobility Specialist Time Calculation (min) (ACUTE ONLY) 30 min   Pt received in bed, daughter in room. Agreeable to mobility, required CGA to stand and ambulate with RW. Tolerated well, asx throughout. Returned to room, left pt in chair. Call bell in reach, all needs met.  Lawerance Bach Mobility Specialist Please contact via Special educational needs teacher or  Rehab office at [phone]

## 2023-09-19 NOTE — Plan of Care (Signed)
  Problem: Education: Goal: Knowledge of General Education information will improve Description: Including pain rating scale, medication(s)/side effects and non-pharmacologic comfort measures Outcome: Progressing   Problem: Health Behavior/Discharge Planning: Goal: Ability to manage health-related needs will improve Outcome: Progressing   Problem: Clinical Measurements: Goal: Ability to maintain clinical measurements within normal limits will improve Outcome: Progressing Goal: Will remain free from infection Outcome: Progressing Goal: Diagnostic test results will improve Outcome: Progressing Goal: Respiratory complications will improve Outcome: Progressing Goal: Cardiovascular complication will be avoided Outcome: Progressing   Problem: Activity: Goal: Risk for activity intolerance will decrease Outcome: Progressing   Problem: Nutrition: Goal: Adequate nutrition will be maintained Outcome: Progressing   Problem: Coping: Goal: Level of anxiety will decrease Outcome: Progressing   Problem: Pain Management: Goal: General experience of comfort will improve Outcome: Progressing   Problem: Safety: Goal: Ability to remain free from injury will improve Outcome: Progressing   Problem: Skin Integrity: Goal: Risk for impaired skin integrity will decrease Outcome: Progressing   Problem: Education: Goal: Ability to describe self-care measures that may prevent or decrease complications (Diabetes Survival Skills Education) will improve Outcome: Progressing Goal: Individualized Educational Video(s) Outcome: Progressing   Problem: Coping: Goal: Ability to adjust to condition or change in health will improve Outcome: Progressing   Problem: Fluid Volume: Goal: Ability to maintain a balanced intake and output will improve Outcome: Progressing   Problem: Health Behavior/Discharge Planning: Goal: Ability to identify and utilize available resources and services will  improve Outcome: Progressing Goal: Ability to manage health-related needs will improve Outcome: Progressing   Problem: Metabolic: Goal: Ability to maintain appropriate glucose levels will improve Outcome: Progressing   Problem: Nutritional: Goal: Maintenance of adequate nutrition will improve Outcome: Progressing Goal: Progress toward achieving an optimal weight will improve Outcome: Progressing   Problem: Skin Integrity: Goal: Risk for impaired skin integrity will decrease Outcome: Progressing   Problem: Tissue Perfusion: Goal: Adequacy of tissue perfusion will improve Outcome: Progressing

## 2023-09-19 NOTE — Progress Notes (Signed)
Mobility Specialist Progress Note:    09/19/23 0945  Mobility  Activity Ambulated with assistance to bathroom  Level of Assistance Minimal assist, patient does 75% or more  Assistive Device Front wheel walker  Distance Ambulated (ft) 10 ft  Range of Motion/Exercises Active;All extremities  Activity Response Tolerated well  Mobility Referral Yes  Mobility visit 1 Mobility  Mobility Specialist Start Time (ACUTE ONLY) 0930  Mobility Specialist Stop Time (ACUTE ONLY) 0945  Mobility Specialist Time Calculation (min) (ACUTE ONLY) 15 min   Pt received in bed requesting assistance to bathroom. Required MinA to stand and SBA to ambulate with RW. Tolerated well, deferred further mobility d/t necessary peri care. Notified NT, all needs met.   Noah Hampton Mobility Specialist Please contact via Special educational needs teacher or  Rehab office at [phone]

## 2023-09-19 NOTE — Progress Notes (Addendum)
Rounding Note    Patient Name: Noah Hampton Date of Encounter: 09/19/2023  [facility] Cardiologist: Marjo Bicker, MD   Subjective   Pt denies CP   Says recently he has just been tired   Not frankly SOB    Inpatient Medications    Scheduled Meds:  aspirin EC  81 mg Oral Daily   carvedilol  6.25 mg Oral BID WC   dapagliflozin propanediol  10 mg Oral Daily   enoxaparin (LOVENOX) injection  40 mg Subcutaneous Q24H   escitalopram  10 mg Oral Daily   furosemide  40 mg Intravenous Daily   influenza vaccine adjuvanted  0.5 mL Intramuscular Tomorrow-1000   insulin aspart  0-6 Units Subcutaneous TID WC   insulin aspart  4 Units Subcutaneous TID WC   pantoprazole  40 mg Oral Q0600   sacubitril-valsartan  1 tablet Oral BID   sodium chloride flush  3 mL Intravenous Q12H   spironolactone  25 mg Oral Daily   Continuous Infusions:  PRN Meds: acetaminophen, albuterol, clonazePAM, ondansetron (ZOFRAN) IV, prochlorperazine   Vital Signs    Vitals:   09/18/23 0800 09/18/23 1407 09/18/23 2058 09/19/23 0349  BP: 132/72 111/71 117/68 124/66  Pulse: 83 81 70 83  Resp:   (!) 24 20  Temp:  98 F (36.7 C) 98.3 F (36.8 C) 99 F (37.2 C)  TempSrc:  Oral  Oral  SpO2: 92% 92% 93% 91%  Weight:    117 kg  Height:        Intake/Output Summary (Last 24 hours) at 09/19/2023 0835 Last data filed at 09/19/2023 0144 Gross per 24 hour  Intake 480 ml  Output 850 ml  Net -370 ml   Net neg 1.75 L       09/19/2023    3:49 AM 09/18/2023    5:00 AM 09/17/2023   11:29 AM  Last 3 Weights  Weight (lbs) 257 lb 15 oz 255 lb 15.3 oz 255 lb 11.7 oz  Weight (kg) 117 kg 116.1 kg 116 kg      Telemetry    SR   Paced    - Personally Reviewed  ECG    No new  - Personally Reviewed  Physical Exam   GEN: No acute distress.   Neck: Neck is full  Cardiac: RRR, no murmur   Respiratory: Clear to auscultation  GI: Sl distended    Nontender  MS: Tr to 1+ edema;   Labs     High Sensitivity Troponin:   Recent Labs  Lab 09/16/23 2024 09/16/23 2244  TROPONINIHS 8 8     Chemistry Recent Labs  Lab 09/16/23 2024 09/17/23 0516 09/18/23 0814 09/19/23 0429  NA 135 136 135 134*  K 4.1 3.9 3.6 3.8  CL 104 98 99 100  CO2 22 27 26 25   GLUCOSE 214* 212* 163* 191*  BUN 16 17 25* 38*  CREATININE 0.98 1.14 1.19 1.06  CALCIUM 9.2 9.2 8.7* 8.7*  MG  --  2.0 2.3  --   PROT 7.1  --   --   --   ALBUMIN 3.5  --   --   --   AST 15  --   --   --   ALT 19  --   --   --   ALKPHOS 61  --   --   --   BILITOT 0.9  --   --   --   GFRNONAA >60 >60 >60 >60  ANIONGAP 9 11 10 9     Lipids No results for input(s): "CHOL", "TRIG", "HDL", "LABVLDL", "LDLCALC", "CHOLHDL" in the last 168 hours.  Hematology Recent Labs  Lab 09/16/23 2024 09/17/23 0516  WBC 10.6* 10.7*  RBC 4.91 5.00  HGB 14.9 14.8  HCT 46.9 47.6  MCV 95.5 95.2  MCH 30.3 29.6  MCHC 31.8 31.1  RDW 13.6 13.7  PLT 141* 148*   Thyroid No results for input(s): "TSH", "FREET4" in the last 168 hours.  BNP Recent Labs  Lab 09/16/23 2024  BNP 169.0*    DDimer No results for input(s): "DDIMER" in the last 168 hours.   Radiology    ECHOCARDIOGRAM COMPLETE Result Date: 09/17/2023    ECHOCARDIOGRAM REPORT   Patient Name:   [name] Date of Exam: 09/17/2023 Medical Rec #:  [id number]   Height:       68.0 in Accession #:    [id number]  Weight:       259.5 lb Date of Birth:  13-Jun-1951  BSA:          2.283 m Patient Age:    72 years    BP:           134/62 mmHg Patient Gender: M           HR:           88 bpm. [facility] Procedure: 2D Echo, Cardiac Doppler, Color Doppler and Intracardiac            Opacification Agent Indications:    Congestive Heart Failure I50.9  History:        Patient has prior history of Echocardiogram examinations, most                 recent 05/27/2023. CHF, CAD and Previous Myocardial Infarction;                 Risk Factors:Hypertension, Diabetes, Dyslipidemia, Sleep  Apnea                 and Non-Smoker.  Sonographer:    Celesta Gentile RCS Referring Phys: [id number] JONATHAN SEGARS IMPRESSIONS  1. Left ventricular ejection fraction, by estimation, is 45 to 50%. The left ventricle has mildly decreased function. The left ventricle demonstrates global hypokinesis. There is mild concentric left ventricular hypertrophy. Indeterminate diastolic filling due to E-A fusion.  2. Right ventricular systolic function is normal. The right ventricular size is normal. Tricuspid regurgitation signal is inadequate for assessing PA pressure.  3. Moderate pericardial effusion. The pericardial effusion is circumferential. There is no evidence of cardiac tamponade.  4. The mitral valve was not well visualized. No evidence of mitral valve regurgitation.  5. The aortic valve is tricuspid. There is mild calcification of the aortic valve. Aortic valve regurgitation is not visualized.  6. The inferior vena cava is dilated in size with >50% respiratory variability, suggesting right atrial pressure of 8 mmHg. Conclusion(s)/Recommendation(s): EF has improved compared to prior study. Has pericardial effusion, no echocardiographic signs of cardiac tamponade. FINDINGS  Left Ventricle: Left ventricular ejection fraction, by estimation, is 45 to 50%. The left ventricle has mildly decreased function. The left ventricle demonstrates global hypokinesis. Definity contrast agent was given IV to delineate the left ventricular  endocardial borders. The left ventricular internal cavity size was normal in size. There is mild concentric left ventricular hypertrophy. Abnormal (paradoxical) septal motion, consistent with RV pacemaker. Indeterminate diastolic filling due to E-A fusion. Right Ventricle: The right ventricular size is  normal. Right ventricular systolic function is normal. Tricuspid regurgitation signal is inadequate for assessing PA pressure. Left Atrium: Left atrial size was normal in size. Right Atrium: Right  atrial size was normal in size. Pericardium: A moderately sized pericardial effusion is present. The pericardial effusion is circumferential. There is no evidence of cardiac tamponade. Mitral Valve: The mitral valve was not well visualized. No evidence of mitral valve regurgitation. Tricuspid Valve: Tricuspid valve regurgitation is not demonstrated. Aortic Valve: The aortic valve is tricuspid. There is mild calcification of the aortic valve. Aortic valve regurgitation is not visualized. Pulmonic Valve: Pulmonic valve regurgitation is not visualized. Aorta: The aortic root is normal in size and structure. Venous: The inferior vena cava is dilated in size with greater than 50% respiratory variability, suggesting right atrial pressure of 8 mmHg. IAS/Shunts: The interatrial septum was not well visualized.  LEFT VENTRICLE PLAX 2D LVIDd:         4.30 cm   Diastology LVIDs:         3.10 cm   LV e' medial:    7.07 cm/s LV PW:         1.30 cm   LV E/e' medial:  8.7 LV IVS:        1.20 cm   LV e' lateral:   5.87 cm/s LVOT diam:     2.10 cm   LV E/e' lateral: 10.5 LV SV:         56 LV SV Index:   24 LVOT Area:     3.46 cm  RIGHT VENTRICLE RV S prime:     14.90 cm/s TAPSE (M-mode): 2.1 cm LEFT ATRIUM             Index        RIGHT ATRIUM           Index LA diam:        3.40 cm 1.49 cm/m   RA Area:     15.70 cm LA Vol (A2C):   44.7 ml 19.58 ml/m  RA Volume:   36.00 ml  15.77 ml/m LA Vol (A4C):   49.6 ml 21.73 ml/m LA Biplane Vol: 48.8 ml 21.38 ml/m  AORTIC VALVE LVOT Vmax:   90.43 cm/s LVOT Vmean:  60.767 cm/s LVOT VTI:    0.160 m  AORTA Ao Root diam: 3.30 cm MITRAL VALVE MV Area (PHT): 4.68 cm    SHUNTS MV Decel Time: 162 msec    Systemic VTI:  0.16 m MV E velocity: 61.80 cm/s  Systemic Diam: 2.10 cm MV A velocity: 81.00 cm/s MV E/A ratio:  0.76 Photographer signed by Carolan Clines Signature Date/Time: 09/17/2023/1:35:39 PM    Final    [place] ASCITES (ABDOMEN LIMITED) Result Date: 09/17/2023 CLINICAL DATA:   Abdominal distention Acute heart failure EXAM: LIMITED ABDOMEN ULTRASOUND FOR ASCITES TECHNIQUE: Limited ultrasound survey for ascites was performed in all four abdominal quadrants. COMPARISON:  None available FINDINGS: No significant ascites identified within the abdomen or pelvis. IMPRESSION: No significant ascites. Electronically Signed   By: Acquanetta Belling M.D.   On: 09/17/2023 13:33    Cardiac Studies   Echo 12.25.24  1. Left ventricular ejection fraction, by estimation, is 45 to 50%. The  left ventricle has mildly decreased function. The left ventricle  demonstrates global hypokinesis. There is mild concentric left ventricular  hypertrophy. Indeterminate diastolic  filling due to E-A fusion.   2. Right ventricular systolic function is normal. The right ventricular  size is normal. Tricuspid  regurgitation signal is inadequate for assessing  PA pressure.   3. Moderate pericardial effusion. The pericardial effusion is  circumferential. There is no evidence of cardiac tamponade.   4. The mitral valve was not well visualized. No evidence of mitral valve  regurgitation.   5. The aortic valve is tricuspid. There is mild calcification of the  aortic valve. Aortic valve regurgitation is not visualized.   6. The inferior vena cava is dilated in size with >50% respiratory  variability, suggesting right atrial pressure of 8 mmHg.   Conclusion(s)/Recommendation(s): EF has improved compared to prior study.  Has pericardial effusion, no echocardiographic signs of cardiac tamponade.    Patient Profile     72 y.o. male  Hx of NICM   Admitted for volume overload    Assessment & Plan    1  HFrEF   Nonischemic  Pt s/p   BiV ICD in Nov 2024    Seen in clinic on 09/16/23 with wt gain (16 lb), some wt swelling    Lasix swittched to po torsemide 30 bid for 5 days then 30 daily and 10 pm Presented same day to ER for chest pain and SOB    Pain worse with leaning over.     Since admit he has diuresed  a little   Still appear to have increased volume on exam  I have reviewed echo images from 09/17/23  Very difficult study   I am not convinced that LVEF is 45 to 50%  I have reviewed images with Definity and think it is worse with severe hypokinesis of the distal 1/3 to 1/2 of ventricle He has a mild to mod pericardial effusion   Does not appera to be compromising by echo criteria  REcomm   COntinue IV lasix for now       2   s/p [employer] BiV ICD Nov 2024   Follows with [facility]   3  HTN  BP is controlled  4  HL  Intolerant to statins   On Repatha as outpt  5  CAD   Nonobstructive by cath in April 2024    6  OSA   Uses CPAP   For questions or updates, please contact [facility] Please consult [email or web address] for contact info under        Signed, Dietrich Pates, MD  09/19/2023, 8:35 AM

## 2023-09-19 NOTE — TOC Initial Note (Signed)
Transition of Care [facility]) - Initial/Assessment Note    Patient Details  Name: Noah Hampton MRN: [id number] Date of Birth: 31-Oct-1950  Transition of [facility]) CM/SW Contact:    Elliot Gault, LCSW Phone Number: 09/19/2023, 11:53 AM  Clinical Narrative:                  Pt admitted from home. PT recommending HHPT at dc. Spoke with pt to review dc planning. Pt agreeable to [facility]. Reviewed provider options and pt agreeable to any HH that will take his insurance.  Referral made to [facility] and they accepted. TOC to fax orders and dc summary to [facility] at the time of dc. Fax number is [phone].  TOC will follow.  Expected Discharge Plan: Home w Home Health Services Barriers to Discharge: Continued Medical Work up   Patient Goals and CMS Choice Patient states their goals for this hospitalization and ongoing recovery are:: go home CMS Medicare.gov Compare Post Acute Care list provided to:: Patient Choice offered to / list presented to : Patient      Expected Discharge Plan and Services In-house Referral: Clinical Social Work   Post Acute Care Choice: Home Health Living arrangements for the past 2 months: Single Family Home                           HH Arranged: PT HH Agency: [facility] Date HH Agency Contacted: 09/19/23   Representative spoke with at [facility] Agency: Misty Stanley  Prior Living Arrangements/Services Living arrangements for the past 2 months: Single Family Home Lives with:: Spouse Patient language and need for interpreter reviewed:: Yes Do you feel safe going back to the place where you live?: Yes      Need for Family Participation in Patient Care: No (Comment)   Current home services: DME Criminal Activity/Legal Involvement Pertinent to Current Situation/Hospitalization: No - Comment as needed  Activities of Daily Living   ADL Screening (condition at time of admission) Independently performs ADLs?: Yes (appropriate for developmental age) Is the patient deaf  or have difficulty hearing?: No Does the patient have difficulty seeing, even when wearing glasses/contacts?: No Does the patient have difficulty concentrating, remembering, or making decisions?: No  Permission Sought/Granted Permission sought to share information with : Facility Industrial/product designer granted to share information with : Yes, Verbal Permission Granted     Permission granted to share info w AGENCY: HH        Emotional Assessment Appearance:: Appears stated age Attitude/Demeanor/Rapport: Engaged Affect (typically observed): Pleasant Orientation: : Oriented to Self, Oriented to Place, Oriented to  Time, Oriented to Situation Alcohol / Substance Use: Not Applicable Psych Involvement: No (comment)  Admission diagnosis:  Shortness of breath [R06.02] Tachypnea [R06.82] Acute exacerbation of CHF (congestive heart failure) (HCC) [I50.9] Patient Active Problem List   Diagnosis Date Noted   CHF (congestive heart failure) (HCC) 08/07/2023   Vitamin D deficiency 07/04/2023   Sinus bradycardia 07/04/2023   Metabolic syndrome X 07/04/2023   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   History of urinary stone 07/04/2023   Abnormal weight gain 07/04/2023   Pain of left hip joint 07/01/2023   Incontinence of feces 03/27/2023   ERRONEOUS ENCOUNTER--DISREGARD 03/13/2023   Mixed anxiety and depressive disorder 03/06/2023   Chronic obstructive pulmonary disease (HCC) 02/23/2023   Nonischemic cardiomyopathy (HCC) 01/31/2023   Rash 12/31/2022   Agatston coronary artery calcium score greater than 400 12/31/2022   Physical deconditioning 11/23/2022   Pneumonia due to  infectious organism 11/15/2022   Other hyperlipidemia 11/13/2022   Acute deep vein thrombosis (DVT) of proximal vein of both lower extremities (HCC) 11/13/2022   Acute systolic heart failure (HCC) 11/12/2022   Acute idiopathic gout of right foot 11/12/2022   Acute hypoxic respiratory failure (HCC) 11/12/2022    Myocardial injury 11/12/2022   Acute exacerbation of CHF (congestive heart failure) (HCC) 11/09/2022   Uncontrolled type 2 diabetes mellitus with hyperglycemia, without long-term current use of insulin (HCC) 11/09/2022   Pneumonitis 11/09/2022   Urinary incontinence 09/29/2022   Nasal congestion 09/29/2022   S/P total knee arthroplasty, 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   Hypertensive urgency 06/08/2022   Dyspnea on exertion 05/30/2022   Insulin resistance 05/08/2022   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/2022   Pulmonary embolism (HCC) 04/25/2022   Pain in lower limb 03/28/2022   S/P total knee arthroplasty, left 03/19/2022   Low back pain 08/26/2021   Pain in right foot 01/27/2021   Non-alcoholic fatty liver disease 10/24/2020   History of pancreatitis 10/24/2020   Syncope and collapse 10/22/2020   Recurrent major depressive episodes, moderate (HCC) 10/22/2020   Increased frequency of urination 10/22/2020   Dizziness 06/20/2020   Pancreatitis 06/18/2020   Decreased testosterone level 02/10/2020   Right upper quadrant pain 08/03/2019   Osteoarthritis 11/13/2016   Hypoxia 11/13/2016   Chest pain with high risk of acute coronary syndrome 02/02/2016   Primary hypertension 02/02/2016   Type 2 diabetes mellitus, uncontrolled 02/02/2016   Dyslipidemia 02/02/2016   Morbid obesity (HCC) 02/02/2016   OSA (obstructive sleep apnea) 02/02/2016   Chest pain 02/02/2016   Atypical chest pain 02/02/2016   PCP:  Amedeo Plenty, DO Pharmacy:   [facility] - [place], [place] - [address] [place] [address] Phone: [phone] Fax: [phone]     Social Drivers of Health (SDOH) Social History: SDOH Screenings   Food Insecurity: No Food Insecurity (09/17/2023)  Housing: Low Risk  (09/17/2023)  Transportation Needs: No Transportation Needs (09/17/2023)  Utilities: Not At Risk (09/17/2023)  Tobacco Use: Low Risk  (09/17/2023)   SDOH  Interventions:     Readmission Risk Interventions     No data to display

## 2023-09-20 DIAGNOSIS — I10 Essential (primary) hypertension: Secondary | ICD-10-CM | POA: Diagnosis not present

## 2023-09-20 DIAGNOSIS — E1165 Type 2 diabetes mellitus with hyperglycemia: Secondary | ICD-10-CM | POA: Diagnosis not present

## 2023-09-20 DIAGNOSIS — I5023 Acute on chronic systolic (congestive) heart failure: Secondary | ICD-10-CM | POA: Diagnosis not present

## 2023-09-20 DIAGNOSIS — E66812 Obesity, class 2: Secondary | ICD-10-CM | POA: Diagnosis not present

## 2023-09-20 LAB — BASIC METABOLIC PANEL
Anion gap: 7 (ref 5–15)
BUN: 46 mg/dL — ABNORMAL HIGH (ref 8–23)
CO2: 25 mmol/L (ref 22–32)
Calcium: 8.4 mg/dL — ABNORMAL LOW (ref 8.9–10.3)
Chloride: 102 mmol/L (ref 98–111)
Creatinine, Ser: 1.34 mg/dL — ABNORMAL HIGH (ref 0.61–1.24)
GFR, Estimated: 56 mL/min — ABNORMAL LOW (ref >60)
Glucose, Bld: 154 mg/dL — ABNORMAL HIGH (ref 70–99)
Potassium: 3.9 mmol/L (ref 3.5–5.1)
Sodium: 134 mmol/L — ABNORMAL LOW (ref 135–145)

## 2023-09-20 LAB — GLUCOSE, CAPILLARY
Glucose-Capillary: 160 mg/dL — ABNORMAL HIGH (ref 70–99)
Glucose-Capillary: 207 mg/dL — ABNORMAL HIGH (ref 70–99)
Glucose-Capillary: 160 mg/dL — ABNORMAL HIGH (ref 70–99)
Glucose-Capillary: 206 mg/dL — ABNORMAL HIGH (ref 70–99)

## 2023-09-20 LAB — MAGNESIUM: Magnesium: 2.4 mg/dL (ref 1.7–2.4)

## 2023-09-20 NOTE — Plan of Care (Signed)

## 2023-09-20 NOTE — Plan of Care (Signed)
  Problem: Education: Goal: Knowledge of General Education information will improve Description: Including pain rating scale, medication(s)/side effects and non-pharmacologic comfort measures Outcome: Progressing   Problem: Health Behavior/Discharge Planning: Goal: Ability to manage health-related needs will improve Outcome: Progressing   Problem: Clinical Measurements: Goal: Ability to maintain clinical measurements within normal limits will improve Outcome: Progressing Goal: Will remain free from infection Outcome: Progressing Goal: Diagnostic test results will improve Outcome: Progressing Goal: Respiratory complications will improve Outcome: Progressing Goal: Cardiovascular complication will be avoided Outcome: Progressing   Problem: Activity: Goal: Risk for activity intolerance will decrease Outcome: Progressing   Problem: Nutrition: Goal: Adequate nutrition will be maintained Outcome: Progressing   Problem: Coping: Goal: Level of anxiety will decrease Outcome: Progressing   Problem: Pain Management: Goal: General experience of comfort will improve Outcome: Progressing   Problem: Safety: Goal: Ability to remain free from injury will improve Outcome: Progressing   Problem: Skin Integrity: Goal: Risk for impaired skin integrity will decrease Outcome: Progressing   Problem: Coping: Goal: Ability to adjust to condition or change in health will improve Outcome: Progressing   Problem: Fluid Volume: Goal: Ability to maintain a balanced intake and output will improve Outcome: Progressing   Problem: Health Behavior/Discharge Planning: Goal: Ability to identify and utilize available resources and services will improve Outcome: Progressing Goal: Ability to manage health-related needs will improve Outcome: Progressing   Problem: Nutritional: Goal: Maintenance of adequate nutrition will improve Outcome: Progressing Goal: Progress toward achieving an optimal weight  will improve Outcome: Progressing   Problem: Skin Integrity: Goal: Risk for impaired skin integrity will decrease Outcome: Progressing   Problem: Tissue Perfusion: Goal: Adequacy of tissue perfusion will improve Outcome: Progressing

## 2023-09-20 NOTE — Progress Notes (Signed)
PROGRESS NOTE  Noah Hampton UUV:[id number] DOB: Nov 27, 1950 DOA: 09/16/2023 PCP: Amedeo Plenty, DO  Brief History:  72 y.o. male with hx of heart failure with reduced ejection fraction, recent BiV ICD placement, nonobstructive coronary disease, DVT completed anticoagulation, hypertension, hyperlipidemia, diabetes, obesity, mood disorder, gout, who presents from home due to chest pain and dyspnea.  Reports having nonexertional chest pain located to his upper chest/lower neck worse when leaning over.  Otherwise progressive dyspnea at rest and with exertion that he is not able to clarify further.  Associated lower extremity edema.  Denies any fever, chills, cough/cold symptoms.  He acknowledges skipping doses of his Lasix because he does not like urinating so much.  He has poor understanding of his home medications.  Was seen by cardiology earlier in the day before admission and noted to have 16 pound weight gain over the past month.    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 and has been on GDMT - 05/27/23 Echo LVEF improved to 30-35%  - he presented with dyspnea and 16# weight gain  - his dry weight reportedly has been 251#  - continue IV furosemide 40 mg daily>>hold 12/28 due to soft BP and uptrend in serum creatinine - monitor I/O and weights - monitoring electrolytes and renal function  - 09/17/23 TTE LVEF 45-50% with global hypokinesis; indeterminate diastolic function; moderate pericardial effusion with no signs of tamponade  - ReDS = 27 - inpatient cardiology consult appreciated -requested PT eval to assess strength and ambulation -GDMT--coreg/farxiga/Entresto/spiro -12/28--Entresto held due to soft BP   S/p BiV ICD -placed 07/2023 -follow up [name] -no device shocks   Atypical chest pain symptoms - symptoms more consistent with GI  - treated with pantoprazole and GI cocktail - ACS ruled out  - troponins 8>>8 - pt had a cardiac cath 4/24  with findings of mild to mod nonobstructive CAD    History of DVT - he completed 6 months of DOAC - he was taken off DOAC by Dr. Jenene Slicker recently - he is on DVT prevention chemoprophylaxis   Essential hypertension  - his GDMT medications have been restarted - his blood pressures are controlled>>soft - following closely   Hyperlipidemia  - has myalgias with statins - treated with repatha    Type 2 DM with vascular complications - 09/17/23 A1V--8.4 - SSI coverage ordered - on Ozempic as outpt - resumed home dapagliflozin 10 mg daily    Anxiety/Depression - resumed home escitalopram 10 mg daily  -continue clonazepam 0.5 mg bid   GERD - pantoprazole 40 mg daily ordered    [facility]A - will offer nightly CPAP with auto-titration while in hospital            Family Communication: no  Family at bedside   Consultants:  cardiology   Code Status:  FULL    DVT Prophylaxis: [place] Lovenox     Procedures: As Listed in Progress Note Above   Antibiotics: None        Subjective: Patient denies fevers, chills, headache, chest pain, dyspnea, nausea, vomiting, diarrhea, abdominal pain, dysuria, hematuria, hematochezia, and melena.   Objective: Vitals:   09/20/23 0754 09/20/23 1054 09/20/23 1101 09/20/23 1420  BP: 136/84 (!) 95/58 106/87 116/78  Pulse: 88 85  94  Resp: 18   19  Temp: (!) 97.4 F (36.3 C)   97.8 F (36.6 C)  TempSrc: Oral     SpO2:  91% 92% 93% 94%  Weight:      Height:        Intake/Output Summary (Last 24 hours) at 09/20/2023 1429 Last data filed at 09/20/2023 0800 Gross per 24 hour  Intake 540 ml  Output 400 ml  Net 140 ml   Weight change: -1.3 kg Exam:  General:  Pt is alert, follows commands appropriately, not in acute distress HEENT: No icterus, No thrush, No neck mass, [place]/AT Cardiovascular: RRR, S1/S2, no rubs, no gallops Respiratory: CTA bilaterally, no wheezing, no crackles, no rhonchi Abdomen: Soft/+BS, non tender, non distended,  no guarding Extremities: No edema, No lymphangitis, No petechiae, No rashes, no synovitis   Data Reviewed: I have personally reviewed following labs and imaging studies Basic Metabolic Panel: Recent Labs  Lab 09/16/23 2024 09/17/23 0516 09/18/23 0814 09/19/23 0429 09/20/23 0425  NA 135 136 135 134* 134*  K 4.1 3.9 3.6 3.8 3.9  CL 104 98 99 100 102  CO2 22 27 26 25 25   GLUCOSE 214* 212* 163* 191* 154*  BUN 16 17 25* 38* 46*  CREATININE 0.98 1.14 1.19 1.06 1.34*  CALCIUM 9.2 9.2 8.7* 8.7* 8.4*  MG  --  2.0 2.3  --  2.4  PHOS  --  3.7  --   --   --    Liver Function Tests: Recent Labs  Lab 09/16/23 2024  AST 15  ALT 19  ALKPHOS 61  BILITOT 0.9  PROT 7.1  ALBUMIN 3.5   Recent Labs  Lab 09/16/23 2244  LIPASE 31   No results for input(s): "AMMONIA" in the last 168 hours. Coagulation Profile: No results for input(s): "INR", "PROTIME" in the last 168 hours. CBC: Recent Labs  Lab 09/16/23 2024 09/17/23 0516  WBC 10.6* 10.7*  HGB 14.9 14.8  HCT 46.9 47.6  MCV 95.5 95.2  PLT 141* 148*   Cardiac Enzymes: No results for input(s): "CKTOTAL", "CKMB", "CKMBINDEX", "TROPONINI" in the last 168 hours. BNP: Invalid input(s): "POCBNP" CBG: Recent Labs  Lab 09/19/23 1130 09/19/23 1630 09/19/23 2102 09/20/23 0732 09/20/23 1151  GLUCAP 264* 147* 178* 160* 207*   HbA1C: No results for input(s): "HGBA1C" in the last 72 hours. Urine analysis:    Component Value Date/Time   COLORURINE STRAW (A) 09/16/2023 2230   APPEARANCEUR CLEAR 09/16/2023 2230   LABSPEC 1.012 09/16/2023 2230   PHURINE 5.0 09/16/2023 2230   GLUCOSEU >=500 (A) 09/16/2023 2230   HGBUR SMALL (A) 09/16/2023 2230   BILIRUBINUR NEGATIVE 09/16/2023 2230   KETONESUR NEGATIVE 09/16/2023 2230   PROTEINUR 30 (A) 09/16/2023 2230   NITRITE NEGATIVE 09/16/2023 2230   LEUKOCYTESUR NEGATIVE 09/16/2023 2230   Sepsis Labs: @LABRCNTIP (procalcitonin:4,lacticidven:4) ) Recent Results (from the past 240  hours)  Respiratory (~20 pathogens) panel by PCR     Status: None   Collection Time: 09/17/23  2:42 AM   Specimen: Nasopharyngeal Swab; Respiratory  Result Value Ref Range Status   Adenovirus NOT DETECTED NOT DETECTED Final   Coronavirus 229E NOT DETECTED NOT DETECTED Final    Comment: (NOTE) The Coronavirus on the Respiratory Panel, DOES NOT test for the novel  Coronavirus (2019 nCoV)    Coronavirus HKU1 NOT DETECTED NOT DETECTED Final   Coronavirus NL63 NOT DETECTED NOT DETECTED Final   Coronavirus OC43 NOT DETECTED NOT DETECTED Final   Metapneumovirus NOT DETECTED NOT DETECTED Final   Rhinovirus / Enterovirus NOT DETECTED NOT DETECTED Final   Influenza A NOT DETECTED NOT DETECTED Final   Influenza B NOT DETECTED  NOT DETECTED Final   Parainfluenza Virus 1 NOT DETECTED NOT DETECTED Final   Parainfluenza Virus 2 NOT DETECTED NOT DETECTED Final   Parainfluenza Virus 3 NOT DETECTED NOT DETECTED Final   Parainfluenza Virus 4 NOT DETECTED NOT DETECTED Final   Respiratory Syncytial Virus NOT DETECTED NOT DETECTED Final   Bordetella pertussis NOT DETECTED NOT DETECTED Final   Bordetella Parapertussis NOT DETECTED NOT DETECTED Final   Chlamydophila pneumoniae NOT DETECTED NOT DETECTED Final   Mycoplasma pneumoniae NOT DETECTED NOT DETECTED Final    Comment: Performed at [facility], [address], [place], [place] [address]  MRSA Next Gen by PCR, Nasal     Status: None   Collection Time: 09/17/23  4:52 AM   Specimen: Nasal Mucosa; Nasal Swab  Result Value Ref Range Status   MRSA by PCR Next Gen NOT DETECTED NOT DETECTED Final    Comment: (NOTE) The GeneXpert MRSA Assay (FDA approved for NASAL specimens only), is one component of a comprehensive MRSA colonization surveillance program. It is not intended to diagnose MRSA infection nor to guide or monitor treatment for MRSA infections. Test performance is not FDA approved in patients less than 24 years old. Performed at [facility], [address], [place], [place] [address]      Scheduled Meds:  aspirin EC  81 mg Oral Daily   carvedilol  6.25 mg Oral BID WC   dapagliflozin propanediol  10 mg Oral Daily   enoxaparin (LOVENOX) injection  40 mg Subcutaneous Q24H   escitalopram  10 mg Oral Daily   furosemide  40 mg Intravenous Daily   influenza vaccine adjuvanted  0.5 mL Intramuscular Tomorrow-1000   insulin aspart  0-6 Units Subcutaneous TID WC   insulin aspart  4 Units Subcutaneous TID WC   pantoprazole  40 mg Oral Q0600   sacubitril-valsartan  1 tablet Oral BID   sodium chloride flush  3 mL Intravenous Q12H   spironolactone  25 mg Oral Daily   Continuous Infusions:  Procedures/Studies: ECHOCARDIOGRAM COMPLETE Result Date: 09/17/2023    ECHOCARDIOGRAM REPORT   Patient Name:   WORLEY KERSCHNER Date of Exam: 09/17/2023 Medical Rec #:  [id number]   Height:       68.0 in Accession #:    [id number]  Weight:       259.5 lb Date of Birth:  1951-06-05  BSA:          2.283 m Patient Age:    72 years    BP:           134/62 mmHg Patient Gender: M           HR:           88 bpm. [facility] Procedure: 2D Echo, Cardiac Doppler, Color Doppler and Intracardiac            Opacification Agent Indications:    Congestive Heart Failure I50.9  History:        Patient has prior history of Echocardiogram examinations, most                 recent 05/27/2023. CHF, CAD and Previous Myocardial Infarction;                 Risk Factors:Hypertension, Diabetes, Dyslipidemia, Sleep Apnea                 and Non-Smoker.  Sonographer:    Celesta Gentile RCS Referring Phys: [id number] Dolly Rias IMPRESSIONS  1. Left ventricular ejection fraction, by estimation, is 45 to 50%. The left ventricle has mildly decreased function. The left ventricle demonstrates global hypokinesis. There is mild concentric left ventricular hypertrophy. Indeterminate diastolic filling due to E-A fusion.  2. Right ventricular systolic function is normal. The right  ventricular size is normal. Tricuspid regurgitation signal is inadequate for assessing PA pressure.  3. Moderate pericardial effusion. The pericardial effusion is circumferential. There is no evidence of cardiac tamponade.  4. The mitral valve was not well visualized. No evidence of mitral valve regurgitation.  5. The aortic valve is tricuspid. There is mild calcification of the aortic valve. Aortic valve regurgitation is not visualized.  6. The inferior vena cava is dilated in size with >50% respiratory variability, suggesting right atrial pressure of 8 mmHg. Conclusion(s)/Recommendation(s): EF has improved compared to prior study. Has pericardial effusion, no echocardiographic signs of cardiac tamponade. FINDINGS  Left Ventricle: Left ventricular ejection fraction, by estimation, is 45 to 50%. The left ventricle has mildly decreased function. The left ventricle demonstrates global hypokinesis. Definity contrast agent was given IV to delineate the left ventricular  endocardial borders. The left ventricular internal cavity size was normal in size. There is mild concentric left ventricular hypertrophy. Abnormal (paradoxical) septal motion, consistent with RV pacemaker. Indeterminate diastolic filling due to E-A fusion. Right Ventricle: The right ventricular size is normal. Right ventricular systolic function is normal. Tricuspid regurgitation signal is inadequate for assessing PA pressure. Left Atrium: Left atrial size was normal in size. Right Atrium: Right atrial size was normal in size. Pericardium: A moderately sized pericardial effusion is present. The pericardial effusion is circumferential. There is no evidence of cardiac tamponade. Mitral Valve: The mitral valve was not well visualized. No evidence of mitral valve regurgitation. Tricuspid Valve: Tricuspid valve regurgitation is not demonstrated. Aortic Valve: The aortic valve is tricuspid. There is mild calcification of the aortic valve. Aortic valve  regurgitation is not visualized. Pulmonic Valve: Pulmonic valve regurgitation is not visualized. Aorta: The aortic root is normal in size and structure. Venous: The inferior vena cava is dilated in size with greater than 50% respiratory variability, suggesting right atrial pressure of 8 mmHg. IAS/Shunts: The interatrial septum was not well visualized.  LEFT VENTRICLE PLAX 2D LVIDd:         4.30 cm   Diastology LVIDs:         3.10 cm   LV e' medial:    7.07 cm/s LV PW:         1.30 cm   LV E/e' medial:  8.7 LV IVS:        1.20 cm   LV e' lateral:   5.87 cm/s LVOT diam:     2.10 cm   LV E/e' lateral: 10.5 LV SV:         56 LV SV Index:   24 LVOT Area:     3.46 cm  RIGHT VENTRICLE RV S prime:     14.90 cm/s TAPSE (M-mode): 2.1 cm LEFT ATRIUM             Index        RIGHT ATRIUM           Index LA diam:        3.40 cm 1.49 cm/m   RA Area:     15.70 cm LA Vol (A2C):   44.7 ml 19.58 ml/m  RA Volume:   36.00 ml  15.77 ml/m LA Vol (A4C):   49.6 ml 21.73 ml/m  LA Biplane Vol: 48.8 ml 21.38 ml/m  AORTIC VALVE LVOT Vmax:   90.43 cm/s LVOT Vmean:  60.767 cm/s LVOT VTI:    0.160 m  AORTA Ao Root diam: 3.30 cm MITRAL VALVE MV Area (PHT): 4.68 cm    SHUNTS MV Decel Time: 162 msec    Systemic VTI:  0.16 m MV E velocity: 61.80 cm/s  Systemic Diam: 2.10 cm MV A velocity: 81.00 cm/s MV E/A ratio:  0.76 Photographer signed by Carolan Clines Signature Date/Time: 09/17/2023/1:35:39 PM    Final    [place] ASCITES (ABDOMEN LIMITED) Result Date: 09/17/2023 CLINICAL DATA:  Abdominal distention Acute heart failure EXAM: LIMITED ABDOMEN ULTRASOUND FOR ASCITES TECHNIQUE: Limited ultrasound survey for ascites was performed in all four abdominal quadrants. COMPARISON:  None available FINDINGS: No significant ascites identified within the abdomen or pelvis. IMPRESSION: No significant ascites. Electronically Signed   By: Acquanetta Belling M.D.   On: 09/17/2023 13:33   CT Angio Chest PE W and/or Wo Contrast Result Date:  09/16/2023 CLINICAL DATA:  Chest pain EXAM: CT ANGIOGRAPHY CHEST WITH CONTRAST TECHNIQUE: Multidetector CT imaging of the chest was performed using the standard protocol during bolus administration of intravenous contrast. Multiplanar CT image reconstructions and MIPs were obtained to evaluate the vascular anatomy. RADIATION DOSE REDUCTION: This exam was performed according to the departmental dose-optimization program which includes automated exposure control, adjustment of the mA and/or kV according to patient size and/or use of iterative reconstruction technique. CONTRAST:  75mL OMNIPAQUE IOHEXOL 350 MG/ML SOLN COMPARISON:  11/09/2022 FINDINGS: Cardiovascular: No filling defects in the pulmonary arteries to suggest pulmonary emboli. Cardiomegaly. Pacer wires in the right heart. Small to moderate pericardial effusion. Aorta normal caliber. Scattered coronary artery and aortic calcifications. Mediastinum/Nodes: No mediastinal, hilar, or axillary adenopathy. Trachea and esophagus are unremarkable. Thyroid unremarkable. Lungs/Pleura: Bibasilar scarring or atelectasis. No confluent opacities, apart edema, or effusions. Upper Abdomen: No acute findings Musculoskeletal: Chest wall soft tissues are unremarkable. No acute bony abnormality. Review of the MIP images confirms the above findings. IMPRESSION: No evidence of pulmonary embolus. Cardiomegaly.  Small to moderate pericardial effusion. Coronary artery disease. Bibasilar atelectasis or scarring. Aortic Atherosclerosis (ICD10-I70.0). Electronically Signed   By: Charlett Nose M.D.   On: 09/16/2023 21:31   DG Chest Port 1 View Result Date: 09/16/2023 CLINICAL DATA:  Chest pain. EXAM: PORTABLE CHEST 1 VIEW COMPARISON:  08/08/2023. FINDINGS: Bilateral lung fields are clear. Bilateral costophrenic angles are clear. Stable cardio-mediastinal silhouette. There is a left sided 3-lead pacemaker. No acute osseous abnormalities. Left lateral clavicular ORIF noted. The soft  tissues are within normal limits. IMPRESSION: No active disease. Electronically Signed   By: Jules Schick M.D.   On: 09/16/2023 20:48   CUP PACEART INCLINIC DEVICE CHECK Result Date: 08/28/2023 Wound check appointment. Steri-strips removed. Wound without redness or edema. Incision edges approximated, wound well healed. Normal device function. Thresholds, sensing, and impedances consistent with implant measurements. Device programmed at 3.5V for  extra safety margin until 3 month visit. Histogram distribution appropriate for patient and level of activity. No mode switches or ventricular arrhythmias noted. Patient educated about wound care, arm mobility, lifting restrictions, shock plan. ROV in 3  months with implanting physician.Ancil Boozer, BSN, RN   Catarina Hartshorn, DO  [facility]  If 7PM-7AM, please contact night-coverage [email or web address] Password [email or web address] 09/20/2023, 2:29 PM   LOS: 3 days

## 2023-09-21 DIAGNOSIS — E1165 Type 2 diabetes mellitus with hyperglycemia: Secondary | ICD-10-CM | POA: Diagnosis not present

## 2023-09-21 DIAGNOSIS — E66812 Obesity, class 2: Secondary | ICD-10-CM | POA: Diagnosis not present

## 2023-09-21 DIAGNOSIS — I5023 Acute on chronic systolic (congestive) heart failure: Secondary | ICD-10-CM | POA: Diagnosis not present

## 2023-09-21 LAB — GLUCOSE, CAPILLARY
Glucose-Capillary: 167 mg/dL — ABNORMAL HIGH (ref 70–99)
Glucose-Capillary: 281 mg/dL — ABNORMAL HIGH (ref 70–99)
Glucose-Capillary: 164 mg/dL — ABNORMAL HIGH (ref 70–99)
Glucose-Capillary: 245 mg/dL — ABNORMAL HIGH (ref 70–99)

## 2023-09-21 LAB — BASIC METABOLIC PANEL
Anion gap: 10 (ref 5–15)
BUN: 60 mg/dL — ABNORMAL HIGH (ref 8–23)
CO2: 25 mmol/L (ref 22–32)
Calcium: 8.6 mg/dL — ABNORMAL LOW (ref 8.9–10.3)
Chloride: 100 mmol/L (ref 98–111)
Creatinine, Ser: 1.57 mg/dL — ABNORMAL HIGH (ref 0.61–1.24)
GFR, Estimated: 46 mL/min — ABNORMAL LOW (ref >60)
Glucose, Bld: 169 mg/dL — ABNORMAL HIGH (ref 70–99)
Potassium: 3.9 mmol/L (ref 3.5–5.1)
Sodium: 135 mmol/L (ref 135–145)

## 2023-09-21 LAB — MAGNESIUM: Magnesium: 2.3 mg/dL (ref 1.7–2.4)

## 2023-09-21 NOTE — Progress Notes (Addendum)
PROGRESS NOTE  Noah Hampton ZOX:[id number] DOB: [birth year] DOA: 09/16/2023 PCP: Amedeo Plenty, DO  Brief History:  72 y.o. male with hx of heart failure with reduced ejection fraction, recent BiV ICD placement, nonobstructive coronary disease, DVT completed anticoagulation, hypertension, hyperlipidemia, diabetes, obesity, mood disorder, gout, who presents from home due to chest pain and dyspnea.  Reports having nonexertional chest pain located to his upper chest/lower neck worse when leaning over.  Otherwise progressive dyspnea at rest and with exertion that he is not able to clarify further.  Associated lower extremity edema.  Denies any fever, chills, cough/cold symptoms.  He acknowledges skipping doses of his Lasix because he does not like urinating so much.  He has poor understanding of his home medications.  Was seen by cardiology earlier in the day before admission and noted to have 16 pound weight gain over the past month.    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 and has been on GDMT - 05/27/23 Echo LVEF improved to 30-35%  - he presented with dyspnea and 16# weight gain  - his dry weight reportedly has been 251#  - continue IV furosemide 40 mg daily>>hold 12/28 due to soft BP and uptrend in serum creatinine - monitor I/O and weights--incomplete  - 09/17/23 TTE LVEF 45-50% with global hypokinesis; indeterminate diastolic function; moderate pericardial effusion with no signs of tamponade  - 12/28 ReDS = 35 - inpatient cardiology consult appreciated -requested PT eval to assess strength and ambulation -GDMT--coreg/farxiga/Entresto/spiro -12/28--Entresto held due to soft BP  AKI -due to diuresis -holding lasix -hold Entresto 12/29   S/p BiV ICD -placed 07/2023 -follow up [name] -no device shocks   Atypical chest pain symptoms - symptoms more consistent with GI  - treated with pantoprazole and GI cocktail - ACS ruled out  - troponins  8>>8 - pt had a cardiac cath 4/24 with findings of mild to mod nonobstructive CAD    History of DVT - he completed 6 months of DOAC - he was taken off DOAC by Dr. Jenene Slicker recently - he is on DVT prevention chemoprophylaxis   Essential hypertension  - his GDMT medications have been restarted - his blood pressures are now soft   Hyperlipidemia  - has myalgias with statins - treated with repatha    Type 2 DM with vascular complications - 09/17/23 A1V--8.4 - SSI coverage ordered - on Ozempic as outpt - resumed home dapagliflozin 10 mg daily    Anxiety/Depression - resumed home escitalopram 10 mg daily  -continue clonazepam 0.5 mg bid   GERD - pantoprazole 40 mg daily ordered    [facility] - will offer nightly CPAP with auto-titration while in hospital    Social -daughter expressed concern that pt not taking meds as directed at home and concern regarding his declining function  -plan [facility] or [facility] referral at discharge       Family Communication: daughter updated 12/29   Consultants:  cardiology   Code Status:  FULL    DVT Prophylaxis: [place] Lovenox     Procedures: As Listed in Progress Note Above   Antibiotics: None             Subjective: Patient denies fevers, chills, headache, chest pain, dyspnea, nausea, vomiting, diarrhea, abdominal pain, dysuria, hematuria, hematochezia, and melena.   Objective: Vitals:   09/21/23 0628 09/21/23 0857 09/21/23 1402 09/21/23 1711  BP: 108/81 (!) 111/56 99/65 123/65  Pulse:  73 92 72 79  Resp: 18 18 18    Temp: 98.2 F (36.8 C)  98 F (36.7 C)   TempSrc: Oral     SpO2: 92% 92% 94% 93%  Weight:      Height:        Intake/Output Summary (Last 24 hours) at 09/21/2023 1718 Last data filed at 09/21/2023 1700 Gross per 24 hour  Intake 540 ml  Output 800 ml  Net -260 ml   Weight change: -0.1 kg Exam:  General:  Pt is alert, follows commands appropriately, not in acute distress HEENT: No icterus, No thrush, No  neck mass, [place]/AT Cardiovascular: RRR, S1/S2, no rubs, no gallops Respiratory: CTA bilaterally, no wheezing, no crackles, no rhonchi Abdomen: Soft/+BS, non tender, non distended, no guarding Extremities: No edema, No lymphangitis, No petechiae, No rashes, no synovitis   Data Reviewed: I have personally reviewed following labs and imaging studies Basic Metabolic Panel: Recent Labs  Lab 09/17/23 0516 09/18/23 0814 09/19/23 0429 09/20/23 0425 09/21/23 0423  NA 136 135 134* 134* 135  K 3.9 3.6 3.8 3.9 3.9  CL 98 99 100 102 100  CO2 27 26 25 25 25   GLUCOSE 212* 163* 191* 154* 169*  BUN 17 25* 38* 46* 60*  CREATININE 1.14 1.19 1.06 1.34* 1.57*  CALCIUM 9.2 8.7* 8.7* 8.4* 8.6*  MG 2.0 2.3  --  2.4 2.3  PHOS 3.7  --   --   --   --    Liver Function Tests: Recent Labs  Lab 09/16/23 2024  AST 15  ALT 19  ALKPHOS 61  BILITOT 0.9  PROT 7.1  ALBUMIN 3.5   Recent Labs  Lab 09/16/23 2244  LIPASE 31   No results for input(s): "AMMONIA" in the last 168 hours. Coagulation Profile: No results for input(s): "INR", "PROTIME" in the last 168 hours. CBC: Recent Labs  Lab 09/16/23 2024 09/17/23 0516  WBC 10.6* 10.7*  HGB 14.9 14.8  HCT 46.9 47.6  MCV 95.5 95.2  PLT 141* 148*   Cardiac Enzymes: No results for input(s): "CKTOTAL", "CKMB", "CKMBINDEX", "TROPONINI" in the last 168 hours. BNP: Invalid input(s): "POCBNP" CBG: Recent Labs  Lab 09/20/23 1623 09/20/23 2035 09/21/23 0801 09/21/23 1133 09/21/23 1627  GLUCAP 160* 206* 167* 281* 164*   HbA1C: No results for input(s): "HGBA1C" in the last 72 hours. Urine analysis:    Component Value Date/Time   COLORURINE STRAW (A) 09/16/2023 2230   APPEARANCEUR CLEAR 09/16/2023 2230   LABSPEC 1.012 09/16/2023 2230   PHURINE 5.0 09/16/2023 2230   GLUCOSEU >=500 (A) 09/16/2023 2230   HGBUR SMALL (A) 09/16/2023 2230   BILIRUBINUR NEGATIVE 09/16/2023 2230   KETONESUR NEGATIVE 09/16/2023 2230   PROTEINUR 30 (A) 09/16/2023  2230   NITRITE NEGATIVE 09/16/2023 2230   LEUKOCYTESUR NEGATIVE 09/16/2023 2230   Sepsis Labs: @LABRCNTIP (procalcitonin:4,lacticidven:4) ) Recent Results (from the past 240 hours)  Respiratory (~20 pathogens) panel by PCR     Status: None   Collection Time: 09/17/23  2:42 AM   Specimen: Nasopharyngeal Swab; Respiratory  Result Value Ref Range Status   Adenovirus NOT DETECTED NOT DETECTED Final   Coronavirus 229E NOT DETECTED NOT DETECTED Final    Comment: (NOTE) The Coronavirus on the Respiratory Panel, DOES NOT test for the novel  Coronavirus (2019 nCoV)    Coronavirus HKU1 NOT DETECTED NOT DETECTED Final   Coronavirus NL63 NOT DETECTED NOT DETECTED Final   Coronavirus OC43 NOT DETECTED NOT DETECTED Final   Metapneumovirus NOT DETECTED  NOT DETECTED Final   Rhinovirus / Enterovirus NOT DETECTED NOT DETECTED Final   Influenza A NOT DETECTED NOT DETECTED Final   Influenza B NOT DETECTED NOT DETECTED Final   Parainfluenza Virus 1 NOT DETECTED NOT DETECTED Final   Parainfluenza Virus 2 NOT DETECTED NOT DETECTED Final   Parainfluenza Virus 3 NOT DETECTED NOT DETECTED Final   Parainfluenza Virus 4 NOT DETECTED NOT DETECTED Final   Respiratory Syncytial Virus NOT DETECTED NOT DETECTED Final   Bordetella pertussis NOT DETECTED NOT DETECTED Final   Bordetella Parapertussis NOT DETECTED NOT DETECTED Final   Chlamydophila pneumoniae NOT DETECTED NOT DETECTED Final   Mycoplasma pneumoniae NOT DETECTED NOT DETECTED Final    Comment: Performed at [facility], [address], [place], [place] [address]  MRSA Next Gen by PCR, Nasal     Status: None   Collection Time: 09/17/23  4:52 AM   Specimen: Nasal Mucosa; Nasal Swab  Result Value Ref Range Status   MRSA by PCR Next Gen NOT DETECTED NOT DETECTED Final    Comment: (NOTE) The GeneXpert MRSA Assay (FDA approved for NASAL specimens only), is one component of a comprehensive MRSA colonization surveillance program. It is not intended  to diagnose MRSA infection nor to guide or monitor treatment for MRSA infections. Test performance is not FDA approved in patients less than 50 years old. Performed at [facility], [address], [place], [place] [address]      Scheduled Meds:  aspirin EC  81 mg Oral Daily   carvedilol  6.25 mg Oral BID WC   dapagliflozin propanediol  10 mg Oral Daily   enoxaparin (LOVENOX) injection  40 mg Subcutaneous Q24H   escitalopram  10 mg Oral Daily   furosemide  40 mg Intravenous Daily   influenza vaccine adjuvanted  0.5 mL Intramuscular Tomorrow-1000   insulin aspart  0-6 Units Subcutaneous TID WC   insulin aspart  4 Units Subcutaneous TID WC   pantoprazole  40 mg Oral Q0600   sodium chloride flush  3 mL Intravenous Q12H   spironolactone  25 mg Oral Daily   Continuous Infusions:  Procedures/Studies: ECHOCARDIOGRAM COMPLETE Result Date: 09/17/2023    ECHOCARDIOGRAM REPORT   Patient Name:   TYQUANN MELUCCI Date of Exam: 09/17/2023 Medical Rec #:  [id number]   Height:       68.0 in Accession #:    [id number]  Weight:       259.5 lb Date of Birth:  [birth year]  BSA:          2.283 m Patient Age:    72 years    BP:           134/62 mmHg Patient Gender: M           HR:           88 bpm. [facility] Procedure: 2D Echo, Cardiac Doppler, Color Doppler and Intracardiac            Opacification Agent Indications:    Congestive Heart Failure I50.9  History:        Patient has prior history of Echocardiogram examinations, most                 recent 05/27/2023. CHF, CAD and Previous Myocardial Infarction;                 Risk Factors:Hypertension, Diabetes, Dyslipidemia, Sleep Apnea  and Non-Smoker.  Sonographer:    Celesta Gentile RCS Referring Phys: [id number] JONATHAN SEGARS IMPRESSIONS  1. Left ventricular ejection fraction, by estimation, is 45 to 50%. The left ventricle has mildly decreased function. The left ventricle demonstrates global hypokinesis. There is mild concentric left  ventricular hypertrophy. Indeterminate diastolic filling due to E-A fusion.  2. Right ventricular systolic function is normal. The right ventricular size is normal. Tricuspid regurgitation signal is inadequate for assessing PA pressure.  3. Moderate pericardial effusion. The pericardial effusion is circumferential. There is no evidence of cardiac tamponade.  4. The mitral valve was not well visualized. No evidence of mitral valve regurgitation.  5. The aortic valve is tricuspid. There is mild calcification of the aortic valve. Aortic valve regurgitation is not visualized.  6. The inferior vena cava is dilated in size with >50% respiratory variability, suggesting right atrial pressure of 8 mmHg. Conclusion(s)/Recommendation(s): EF has improved compared to prior study. Has pericardial effusion, no echocardiographic signs of cardiac tamponade. FINDINGS  Left Ventricle: Left ventricular ejection fraction, by estimation, is 45 to 50%. The left ventricle has mildly decreased function. The left ventricle demonstrates global hypokinesis. Definity contrast agent was given IV to delineate the left ventricular  endocardial borders. The left ventricular internal cavity size was normal in size. There is mild concentric left ventricular hypertrophy. Abnormal (paradoxical) septal motion, consistent with RV pacemaker. Indeterminate diastolic filling due to E-A fusion. Right Ventricle: The right ventricular size is normal. Right ventricular systolic function is normal. Tricuspid regurgitation signal is inadequate for assessing PA pressure. Left Atrium: Left atrial size was normal in size. Right Atrium: Right atrial size was normal in size. Pericardium: A moderately sized pericardial effusion is present. The pericardial effusion is circumferential. There is no evidence of cardiac tamponade. Mitral Valve: The mitral valve was not well visualized. No evidence of mitral valve regurgitation. Tricuspid Valve: Tricuspid valve regurgitation  is not demonstrated. Aortic Valve: The aortic valve is tricuspid. There is mild calcification of the aortic valve. Aortic valve regurgitation is not visualized. Pulmonic Valve: Pulmonic valve regurgitation is not visualized. Aorta: The aortic root is normal in size and structure. Venous: The inferior vena cava is dilated in size with greater than 50% respiratory variability, suggesting right atrial pressure of 8 mmHg. IAS/Shunts: The interatrial septum was not well visualized.  LEFT VENTRICLE PLAX 2D LVIDd:         4.30 cm   Diastology LVIDs:         3.10 cm   LV e' medial:    7.07 cm/s LV PW:         1.30 cm   LV E/e' medial:  8.7 LV IVS:        1.20 cm   LV e' lateral:   5.87 cm/s LVOT diam:     2.10 cm   LV E/e' lateral: 10.5 LV SV:         56 LV SV Index:   24 LVOT Area:     3.46 cm  RIGHT VENTRICLE RV S prime:     14.90 cm/s TAPSE (M-mode): 2.1 cm LEFT ATRIUM             Index        RIGHT ATRIUM           Index LA diam:        3.40 cm 1.49 cm/m   RA Area:     15.70 cm LA Vol (A2C):   44.7 ml 19.58 ml/m  RA  Volume:   36.00 ml  15.77 ml/m LA Vol (A4C):   49.6 ml 21.73 ml/m LA Biplane Vol: 48.8 ml 21.38 ml/m  AORTIC VALVE LVOT Vmax:   90.43 cm/s LVOT Vmean:  60.767 cm/s LVOT VTI:    0.160 m  AORTA Ao Root diam: 3.30 cm MITRAL VALVE MV Area (PHT): 4.68 cm    SHUNTS MV Decel Time: 162 msec    Systemic VTI:  0.16 m MV E velocity: 61.80 cm/s  Systemic Diam: 2.10 cm MV A velocity: 81.00 cm/s MV E/A ratio:  0.76 Photographer signed by Carolan Clines Signature Date/Time: 09/17/2023/1:35:39 PM    Final    [place] ASCITES (ABDOMEN LIMITED) Result Date: 09/17/2023 CLINICAL DATA:  Abdominal distention Acute heart failure EXAM: LIMITED ABDOMEN ULTRASOUND FOR ASCITES TECHNIQUE: Limited ultrasound survey for ascites was performed in all four abdominal quadrants. COMPARISON:  None available FINDINGS: No significant ascites identified within the abdomen or pelvis. IMPRESSION: No significant ascites.  Electronically Signed   By: Acquanetta Belling M.D.   On: 09/17/2023 13:33   CT Angio Chest PE W and/or Wo Contrast Result Date: 09/16/2023 CLINICAL DATA:  Chest pain EXAM: CT ANGIOGRAPHY CHEST WITH CONTRAST TECHNIQUE: Multidetector CT imaging of the chest was performed using the standard protocol during bolus administration of intravenous contrast. Multiplanar CT image reconstructions and MIPs were obtained to evaluate the vascular anatomy. RADIATION DOSE REDUCTION: This exam was performed according to the departmental dose-optimization program which includes automated exposure control, adjustment of the mA and/or kV according to patient size and/or use of iterative reconstruction technique. CONTRAST:  75mL OMNIPAQUE IOHEXOL 350 MG/ML SOLN COMPARISON:  11/09/2022 FINDINGS: Cardiovascular: No filling defects in the pulmonary arteries to suggest pulmonary emboli. Cardiomegaly. Pacer wires in the right heart. Small to moderate pericardial effusion. Aorta normal caliber. Scattered coronary artery and aortic calcifications. Mediastinum/Nodes: No mediastinal, hilar, or axillary adenopathy. Trachea and esophagus are unremarkable. Thyroid unremarkable. Lungs/Pleura: Bibasilar scarring or atelectasis. No confluent opacities, apart edema, or effusions. Upper Abdomen: No acute findings Musculoskeletal: Chest wall soft tissues are unremarkable. No acute bony abnormality. Review of the MIP images confirms the above findings. IMPRESSION: No evidence of pulmonary embolus. Cardiomegaly.  Small to moderate pericardial effusion. Coronary artery disease. Bibasilar atelectasis or scarring. Aortic Atherosclerosis (ICD10-I70.0). Electronically Signed   By: Charlett Nose M.D.   On: 09/16/2023 21:31   DG Chest Port 1 View Result Date: 09/16/2023 CLINICAL DATA:  Chest pain. EXAM: PORTABLE CHEST 1 VIEW COMPARISON:  08/08/2023. FINDINGS: Bilateral lung fields are clear. Bilateral costophrenic angles are clear. Stable cardio-mediastinal  silhouette. There is a left sided 3-lead pacemaker. No acute osseous abnormalities. Left lateral clavicular ORIF noted. The soft tissues are within normal limits. IMPRESSION: No active disease. Electronically Signed   By: Jules Schick M.D.   On: 09/16/2023 20:48   CUP PACEART INCLINIC DEVICE CHECK Result Date: 08/28/2023 Wound check appointment. Steri-strips removed. Wound without redness or edema. Incision edges approximated, wound well healed. Normal device function. Thresholds, sensing, and impedances consistent with implant measurements. Device programmed at 3.5V for  extra safety margin until 3 month visit. Histogram distribution appropriate for patient and level of activity. No mode switches or ventricular arrhythmias noted. Patient educated about wound care, arm mobility, lifting restrictions, shock plan. ROV in 3  months with implanting physician.Ancil Boozer, BSN, RN   Catarina Hartshorn, DO  [facility]  If 7PM-7AM, please contact night-coverage [email or web address] Password [email or web address] 09/21/2023, 5:18 PM   LOS: 4 days

## 2023-09-21 NOTE — Progress Notes (Signed)
 Declined use of CPAP

## 2023-09-22 ENCOUNTER — Telehealth: Payer: Self-pay

## 2023-09-22 DIAGNOSIS — I3139 Other pericardial effusion (noninflammatory): Secondary | ICD-10-CM | POA: Diagnosis not present

## 2023-09-22 DIAGNOSIS — I5043 Acute on chronic combined systolic (congestive) and diastolic (congestive) heart failure: Secondary | ICD-10-CM

## 2023-09-22 DIAGNOSIS — I10 Essential (primary) hypertension: Secondary | ICD-10-CM | POA: Diagnosis not present

## 2023-09-22 DIAGNOSIS — E1165 Type 2 diabetes mellitus with hyperglycemia: Secondary | ICD-10-CM | POA: Diagnosis not present

## 2023-09-22 DIAGNOSIS — I5023 Acute on chronic systolic (congestive) heart failure: Secondary | ICD-10-CM | POA: Diagnosis not present

## 2023-09-22 LAB — BASIC METABOLIC PANEL
Anion gap: 9 (ref 5–15)
BUN: 61 mg/dL — ABNORMAL HIGH (ref 8–23)
CO2: 25 mmol/L (ref 22–32)
Calcium: 9 mg/dL (ref 8.9–10.3)
Chloride: 102 mmol/L (ref 98–111)
Creatinine, Ser: 1.28 mg/dL — ABNORMAL HIGH (ref 0.61–1.24)
GFR, Estimated: 59 mL/min — ABNORMAL LOW (ref >60)
Glucose, Bld: 151 mg/dL — ABNORMAL HIGH (ref 70–99)
Potassium: 4 mmol/L (ref 3.5–5.1)
Sodium: 136 mmol/L (ref 135–145)

## 2023-09-22 LAB — GLUCOSE, CAPILLARY
Glucose-Capillary: 166 mg/dL — ABNORMAL HIGH (ref 70–99)
Glucose-Capillary: 252 mg/dL — ABNORMAL HIGH (ref 70–99)

## 2023-09-22 LAB — MAGNESIUM: Magnesium: 2.5 mg/dL — ABNORMAL HIGH (ref 1.7–2.4)

## 2023-09-22 MED ORDER — TORSEMIDE 20 MG PO TABS
40.0000 mg | ORAL_TABLET | Freq: Every day | ORAL | Status: DC
  Administered 2023-09-22: 40 mg via ORAL
  Filled 2023-09-22 (×2): qty 2

## 2023-09-22 MED ORDER — SACUBITRIL-VALSARTAN 97-103 MG PO TABS
1.0000 | ORAL_TABLET | Freq: Two times a day (BID) | ORAL | Status: DC
Start: 2023-09-22 — End: 2023-09-22
  Administered 2023-09-22: 1 via ORAL
  Filled 2023-09-22 (×5): qty 1

## 2023-09-22 MED ORDER — TORSEMIDE 40 MG PO TABS: 40.0000 mg | ORAL_TABLET | Freq: Every day | ORAL | 1 refills | Status: DC

## 2023-09-22 NOTE — Telephone Encounter (Signed)
Alert received from [employer]:  Device alert for LV capture > threshold, out of range AF in progress from 12/28, no hx of PAF noted in EPIC, recent hospitalization, Lovenox for DVT prophylaxis, no oral anticoagulant.   AF burden 3.1%

## 2023-09-22 NOTE — Progress Notes (Signed)
 The beneficiary is confined to a single room and will need 3N1.

## 2023-09-22 NOTE — Progress Notes (Signed)
Physical Therapy Treatment Patient Details Name: Noah Hampton MRN: [id number] DOB: Jan 28, 1951 Today's Date: 09/22/2023   History of Present Illness Noah Hampton is a 72 y.o. male with hx of heart failure with reduced ejection fraction, recent BiV ICD placement, nonobstructive coronary disease, DVT completed anticoagulation, hypertension, hyperlipidemia, diabetes, obesity, mood disorder, gout, who presents from home due to chest pain and dyspnea.  Reports having nonexertional chest pain located to his upper chest/lower neck worse when leaning over.  Otherwise progressive dyspnea at rest and with exertion that he is not able to clarify further.  Associated lower extremity edema.  Denies any fever, chills, cough/cold symptoms.  He acknowledges skipping doses of his Lasix because he does not like urinating so much.  He has poor understanding of his home medications.  Was seen by cardiology earlier in the day before admission and noted to have 16 pound weight gain over the past month.    PT Comments  Patient demonstrates good return for rolling to side and sitting up from side lying position, but had to use bed rail due to weakness, good carryover for completing BLE exercises and had increased endurance/distance for gait training without loss of balance.  Patient tolerated sitting up in chair after therapy with his daughter present.  Patient will benefit from continued skilled physical therapy in hospital and recommended venue below to increase strength, balance, endurance for safe ADLs and gait.      If plan is discharge home, recommend the following: A little help with walking and/or transfers;A little help with bathing/dressing/bathroom;Help with stairs or ramp for entrance;Assistance with cooking/housework   Can travel by private vehicle        Equipment Recommendations  None recommended by PT    Recommendations for Other Services       Precautions / Restrictions Precautions Precautions:  Fall Restrictions Weight Bearing Restrictions Per Provider Order: No     Mobility  Bed Mobility Overal bed mobility: Needs Assistance Bed Mobility: Rolling, Sidelying to Sit Rolling: Modified independent (Device/Increase time), Supervision Sidelying to sit: Supervision       General bed mobility comments: labored movement, had to use bed rail with HOB flat    Transfers Overall transfer level: Needs assistance Equipment used: Rolling walker (2 wheels) Transfers: Sit to/from Stand, Bed to chair/wheelchair/BSC Sit to Stand: Supervision   Step pivot transfers: Contact guard assist, Supervision       General transfer comment: had mild difficulty completing sit to stand from bed side due to BLE weakness    Ambulation/Gait Ambulation/Gait assistance: Supervision Gait Distance (Feet): 65 Feet Assistive device: Rolling walker (2 wheels) Gait Pattern/deviations: Decreased step length - right, Decreased step length - left, Decreased stride length, Trunk flexed Gait velocity: decreased     General Gait Details: increased endurance/distance for ambulation without loss of balance, limited mostly due to fatigue   Stairs             Wheelchair Mobility     Tilt Bed    Modified Rankin (Stroke Patients Only)       Balance Overall balance assessment: Needs assistance Sitting-balance support: Feet supported, No upper extremity supported Sitting balance-Leahy Scale: Good Sitting balance - Comments: seated at EOB   Standing balance support: During functional activity, Bilateral upper extremity supported Standing balance-Leahy Scale: Fair Standing balance comment: fair/good using RW                            Cognition  Arousal: Alert Behavior During Therapy: WFL for tasks assessed/performed Overall Cognitive Status: Within Functional Limits for tasks assessed                                          Exercises General Exercises - Lower  Extremity Long Arc Quad: Seated, AROM, Strengthening, Both, 10 reps Hip Flexion/Marching: Seated, AROM, Strengthening, Both, 10 reps Toe Raises: Seated, AROM, Strengthening, Both, 10 reps Heel Raises: Seated, AROM, Strengthening, Both, 10 reps    General Comments        Pertinent Vitals/Pain Pain Assessment Pain Assessment: No/denies pain    Home Living                          Prior Function            PT Goals (current goals can now be found in the care plan section) Acute Rehab PT Goals Patient Stated Goal: return home with family to assist PT Goal Formulation: With patient/family Time For Goal Achievement: 09/22/23 Potential to Achieve Goals: Good Progress towards PT goals: Progressing toward goals    Frequency    Min 3X/week      PT Plan      Co-evaluation              AM-PAC PT "6 Clicks" Mobility   Outcome Measure  Help needed turning from your back to your side while in a flat bed without using bedrails?: None Help needed moving from lying on your back to sitting on the side of a flat bed without using bedrails?: A Little Help needed moving to and from a bed to a chair (including a wheelchair)?: A Little Help needed standing up from a chair using your arms (e.g., wheelchair or bedside chair)?: A Little Help needed to walk in hospital room?: A Little Help needed climbing 3-5 steps with a railing? : A Little 6 Click Score: 19    End of Session   Activity Tolerance: Patient tolerated treatment well;Patient limited by fatigue Patient left: in chair;with call bell/phone within reach;with family/visitor present Nurse Communication: Mobility status PT Visit Diagnosis: Unsteadiness on feet (R26.81);Other abnormalities of gait and mobility (R26.89)     Time: 0950-1005 PT Time Calculation (min) (ACUTE ONLY): 15 min  Charges:    $Therapeutic Activity: 8-22 mins PT General Charges $$ ACUTE PT VISIT: 1 Visit                     11:48  AM, 09/22/23 Ocie Bob, MPT Physical Therapist with [facility] [phone] office [phone] mobile phone

## 2023-09-22 NOTE — Progress Notes (Signed)
    Subjective:  Denies SSCP, palpitations or Dyspnea Wants to go home   Objective:  Vitals:   09/21/23 2032 09/21/23 2234 09/22/23 0329 09/22/23 0404  BP: 120/72 116/62 118/67   Pulse: 75 70 77   Resp: (!) 30 (!) 29 (!) 24   Temp: 97.7 F (36.5 C) 98.3 F (36.8 C) (!) 97.5 F (36.4 C)   TempSrc: Oral Oral Oral   SpO2: 100% 92% 91%   Weight:    118.2 kg  Height:        Intake/Output from previous day:  Intake/Output Summary (Last 24 hours) at 09/22/2023 0816 Last data filed at 09/22/2023 0600 Gross per 24 hour  Intake 300 ml  Output 1250 ml  Net -950 ml    Physical Exam:  Obese male AICD under left clavicle Lungs clear Abdomen benign Plus one bilateral LE edema Palpable pedal pulses   Lab Results: Basic Metabolic Panel: Recent Labs    09/21/23 0423 09/22/23 0504  NA 135 136  K 3.9 4.0  CL 100 102  CO2 25 25  GLUCOSE 169* 151*  BUN 60* 61*  CREATININE 1.57* 1.28*  CALCIUM 8.6* 9.0  MG 2.3 2.5*     Imaging: No results found.  Cardiac Studies:  ECG: ST RBBB no acute changes   Telemetry:  NSR  Echo: EF 45-50% mild MR moderate pericardial effusion no tamponade  Medications:    aspirin EC  81 mg Oral Daily   carvedilol  6.25 mg Oral BID WC   dapagliflozin propanediol  10 mg Oral Daily   enoxaparin (LOVENOX) injection  40 mg Subcutaneous Q24H   escitalopram  10 mg Oral Daily   furosemide  40 mg Intravenous Daily   influenza vaccine adjuvanted  0.5 mL Intramuscular Tomorrow-1000   insulin aspart  0-6 Units Subcutaneous TID WC   insulin aspart  4 Units Subcutaneous TID WC   pantoprazole  40 mg Oral Q0600   sodium chloride flush  3 mL Intravenous Q12H   spironolactone  25 mg Oral Daily      Assessment/Plan:   CHF:  non ischemic DCM. Volume overload improved but entresto / lasix held due to azotemia with Cr now improved to 1.28 Resume entresto and change to demedex 40 mg daily EF 45-50% by TTE 12/25 Pericardial effusion: moderate no  tamponade can have f/u TTE as outpatient in 2 weeks or so.   Charlton Haws 09/22/2023, 8:16 AM

## 2023-09-22 NOTE — Discharge Summary (Addendum)
Physician Discharge Summary   Patient: Noah Hampton MRN: [id number] DOB: 07-29-1951  Admit date:     09/16/2023  Discharge date: 09/22/23  Discharge Physician: Noah Hua Vaneza Hampton   PCP: Noah Hampton   Recommendations at discharge:   Please follow up with primary care provider within 1-2 weeks  Please repeat BMP and CBC in one week   Discharge Diagnoses:  Hospital Course: 72 y.o. male with hx of heart failure with reduced ejection fraction, recent BiV ICD placement, nonobstructive coronary disease, DVT completed anticoagulation, hypertension, hyperlipidemia, diabetes, obesity, mood disorder, gout, who presents from home due to chest pain and dyspnea.  Reports having nonexertional chest pain located to his upper chest/lower neck worse when leaning over.  Otherwise progressive dyspnea at rest and with exertion that he is not able to clarify further.  Associated lower extremity edema.  Denies any fever, chills, cough/cold symptoms.  He acknowledges skipping doses of his Lasix because he does not like urinating so much.  He has poor understanding of his home medications.  Was seen by cardiology earlier in the day before admission and noted to have 16 pound weight gain over the past month.  The patient was started on IV lasix with good clinical results.  Cardiology was consulted to assist.   Assessment and Plan: Acute on chronic HFrEF  - He reportedly has NICM and has been on GDMT - 05/27/23 Echo LVEF improved to 30-35%  - he presented with dyspnea and 16# weight gain  - his dry weight reportedly has been 251#  - continue IV furosemide 40 mg daily>>hold 12/28 due to soft BP and uptrend in serum creatinine - monitor I/O and weights--incomplete  - 09/17/23 TTE LVEF 45-50% with global hypokinesis; indeterminate diastolic function; moderate pericardial effusion with no signs of tamponade  - 12/28 ReDS = 35 - inpatient cardiology consult appreciated -requested PT eval to assess strength and  ambulation -GDMT--coreg/farxiga/Entresto/spiro -12/28--Entresto held due to soft BP -12/30--cleared for d/c by cardiology--restart coreg, entresto -d/c home with torsemide 40 mg daily -repeat echo as outpt to re-assess pericardial eff   AKI -due to diuresis -holding lasix temporarily 12/29 -hold Entresto 12/29 -renal function improved>>restart diuretics per cardiology -d/c home with torsemide   S/p BiV ICD -placed 07/2023 -follow up Noah Hampton -no device shocks   Atypical chest pain symptoms - symptoms more consistent with GI  - treated with pantoprazole and GI cocktail - ACS ruled out  - troponins 8>>8 - pt had a cardiac cath 4/24 with findings of mild to mod nonobstructive CAD    History of DVT - he completed 6 months of DOAC - he was taken off DOAC by Dr. Jenene Hampton recently - he is on DVT prevention chemoprophylaxis   Essential hypertension  - his GDMT medications have been restarted - his blood pressures are now soft but stable -improved now    Hyperlipidemia  - has myalgias with statins - treated with repatha    Type 2 DM with vascular complications - 09/17/23 A1V--8.4 - SSI coverage ordered - on Ozempic as outpt - resumed home dapagliflozin 10 mg daily    Anxiety/Depression - resumed home escitalopram 10 mg daily  -continue clonazepam 0.5 mg bid   GERD - pantoprazole 40 mg daily ordered    OSA - will offer nightly CPAP with auto-titration while in hospital    Social -daughter expressed concern that pt not taking meds as directed at home and concern regarding his declining function  -plan [facility] or [facility] referral  at discharge if feasible -set up [facility]         Consultants: cardiology Procedures performed: none  Disposition: Home Diet recommendation:  Cardiac diet DISCHARGE MEDICATION: Allergies as of 09/22/2023       Reactions   Statins Other (See Comments)    Myalgias (Muscle Pain)   Alpha-gal Other (See Comments)   Unknown Questionable  history of alpha-gal per patient, has not had any issues with medications in the past as of 11/11/22        Medication List     STOP taking these medications    naproxen sodium 220 MG tablet Commonly known as: ALEVE   potassium chloride SA 20 MEQ tablet Commonly known as: KLOR-CON M       TAKE these medications    acetaminophen 500 MG tablet Commonly known as: TYLENOL Take 1,000 mg by mouth every 6 (six) hours as needed for moderate pain (pain score 4-6).   aspirin EC 81 MG tablet Take 81 mg by mouth daily. Swallow whole. What changed: Another medication with the same name was removed. Continue taking this medication, and follow the directions you see here.   carvedilol 6.25 MG tablet Commonly known as: COREG Take 1 tablet (6.25 mg total) by mouth 2 (two) times daily with a meal.   clonazePAM 0.5 MG tablet Commonly known as: KLONOPIN Take 0.5 mg by mouth 2 (two) times daily.   dimenhyDRINATE 50 MG tablet Commonly known as: DRAMAMINE Take 50 mg by mouth as needed for dizziness (Recent onset of dizziniess).   escitalopram 10 MG tablet Commonly known as: LEXAPRO Take 10 mg by mouth daily.   Farxiga 10 MG Tabs tablet Generic drug: dapagliflozin propanediol Take 10 mg by mouth daily.   Ozempic (0.25 or 0.5 MG/DOSE) 2 MG/3ML Sopn Generic drug: Semaglutide(0.25 or 0.5MG /DOS) Inject 0.25 mg as directed once a week.   Repatha SureClick 140 MG/ML Soaj Generic drug: Evolocumab Inject 140 mg into the skin every 14 (fourteen) days.   sacubitril-valsartan 97-103 MG Commonly known as: ENTRESTO Take 1 tablet by mouth 2 (two) times daily.   spironolactone 25 MG tablet Commonly known as: ALDACTONE Take 1 tablet (25 mg total) by mouth daily.   TIGER BALM ARTHRITIS RUB EX Apply 1 Application topically daily as needed (pain).   Torsemide 40 MG Tabs Take 40 mg by mouth daily. What changed:  medication strength how much to take how to take this when to take  this additional instructions               Durable Medical Equipment  (From admission, onward)           Start     Ordered   09/21/23 1720  For home use only DME Hospital bed  Once       Question Answer Comment  Length of Need 6 Months   The above medical condition requires: Patient requires the ability to reposition frequently   Bed type Semi-electric      09/21/23 1719            Discharge Exam: Filed Weights   09/20/23 0425 09/21/23 0522 09/22/23 0404  Weight: 115.7 kg 115.6 kg 118.2 kg   HEENT:  [place]/AT, No thrush, no icterus CV:  RRR, no rub, no S3, no S4 Lung:  CTA, no wheeze, no rhonchi Abd:  soft/+BS, NT Ext:  No edema, no lymphangitis, no synovitis, no rash   Condition at discharge: stable  The results of significant diagnostics from this  hospitalization (including imaging, microbiology, ancillary and laboratory) are listed below for reference.   Imaging Studies: ECHOCARDIOGRAM COMPLETE Result Date: 09/17/2023    ECHOCARDIOGRAM REPORT   Patient Name:   JAYDEM DITOMASO Date of Exam: 09/17/2023 Medical Rec #:  [id number]   Height:       68.0 in Accession #:    [id number]  Weight:       259.5 lb Date of Birth:  06/12/1951  BSA:          2.283 m Patient Age:    72 years    BP:           134/62 mmHg Patient Gender: M           HR:           88 bpm. [facility] Procedure: 2D Echo, Cardiac Doppler, Color Doppler and Intracardiac            Opacification Agent Indications:    Congestive Heart Failure I50.9  History:        Patient has prior history of Echocardiogram examinations, most                 recent 05/27/2023. CHF, CAD and Previous Myocardial Infarction;                 Risk Factors:Hypertension, Diabetes, Dyslipidemia, Sleep Apnea                 and Non-Smoker.  Sonographer:    Celesta Gentile RCS Referring Phys: [id number] JONATHAN SEGARS IMPRESSIONS  1. Left ventricular ejection fraction, by estimation, is 45 to 50%. The left ventricle has mildly  decreased function. The left ventricle demonstrates global hypokinesis. There is mild concentric left ventricular hypertrophy. Indeterminate diastolic filling due to E-A fusion.  2. Right ventricular systolic function is normal. The right ventricular size is normal. Tricuspid regurgitation signal is inadequate for assessing PA pressure.  3. Moderate pericardial effusion. The pericardial effusion is circumferential. There is no evidence of cardiac tamponade.  4. The mitral valve was not well visualized. No evidence of mitral valve regurgitation.  5. The aortic valve is tricuspid. There is mild calcification of the aortic valve. Aortic valve regurgitation is not visualized.  6. The inferior vena cava is dilated in size with >50% respiratory variability, suggesting right atrial pressure of 8 mmHg. Conclusion(s)/Recommendation(s): EF has improved compared to prior study. Has pericardial effusion, no echocardiographic signs of cardiac tamponade. FINDINGS  Left Ventricle: Left ventricular ejection fraction, by estimation, is 45 to 50%. The left ventricle has mildly decreased function. The left ventricle demonstrates global hypokinesis. Definity contrast agent was given IV to delineate the left ventricular  endocardial borders. The left ventricular internal cavity size was normal in size. There is mild concentric left ventricular hypertrophy. Abnormal (paradoxical) septal motion, consistent with RV pacemaker. Indeterminate diastolic filling due to E-A fusion. Right Ventricle: The right ventricular size is normal. Right ventricular systolic function is normal. Tricuspid regurgitation signal is inadequate for assessing PA pressure. Left Atrium: Left atrial size was normal in size. Right Atrium: Right atrial size was normal in size. Pericardium: A moderately sized pericardial effusion is present. The pericardial effusion is circumferential. There is no evidence of cardiac tamponade. Mitral Valve: The mitral valve was not well  visualized. No evidence of mitral valve regurgitation. Tricuspid Valve: Tricuspid valve regurgitation is not demonstrated. Aortic Valve: The aortic valve is tricuspid. There is mild calcification of the aortic valve. Aortic valve regurgitation is not visualized.  Pulmonic Valve: Pulmonic valve regurgitation is not visualized. Aorta: The aortic root is normal in size and structure. Venous: The inferior vena cava is dilated in size with greater than 50% respiratory variability, suggesting right atrial pressure of 8 mmHg. IAS/Shunts: The interatrial septum was not well visualized.  LEFT VENTRICLE PLAX 2D LVIDd:         4.30 cm   Diastology LVIDs:         3.10 cm   LV e' medial:    7.07 cm/s LV PW:         1.30 cm   LV E/e' medial:  8.7 LV IVS:        1.20 cm   LV e' lateral:   5.87 cm/s LVOT diam:     2.10 cm   LV E/e' lateral: 10.5 LV SV:         56 LV SV Index:   24 LVOT Area:     3.46 cm  RIGHT VENTRICLE RV S prime:     14.90 cm/s TAPSE (M-mode): 2.1 cm LEFT ATRIUM             Index        RIGHT ATRIUM           Index LA diam:        3.40 cm 1.49 cm/m   RA Area:     15.70 cm LA Vol (A2C):   44.7 ml 19.58 ml/m  RA Volume:   36.00 ml  15.77 ml/m LA Vol (A4C):   49.6 ml 21.73 ml/m LA Biplane Vol: 48.8 ml 21.38 ml/m  AORTIC VALVE LVOT Vmax:   90.43 cm/s LVOT Vmean:  60.767 cm/s LVOT VTI:    0.160 m  AORTA Ao Root diam: 3.30 cm MITRAL VALVE MV Area (PHT): 4.68 cm    SHUNTS MV Decel Time: 162 msec    Systemic VTI:  0.16 m MV E velocity: 61.80 cm/s  Systemic Diam: 2.10 cm MV A velocity: 81.00 cm/s MV E/A ratio:  0.76 Photographer signed by Carolan Clines Signature Date/Time: 09/17/2023/1:35:39 PM    Final    [place] ASCITES (ABDOMEN LIMITED) Result Date: 09/17/2023 CLINICAL DATA:  Abdominal distention Acute heart failure EXAM: LIMITED ABDOMEN ULTRASOUND FOR ASCITES TECHNIQUE: Limited ultrasound survey for ascites was performed in all four abdominal quadrants. COMPARISON:  None available FINDINGS: No  significant ascites identified within the abdomen or pelvis. IMPRESSION: No significant ascites. Electronically Signed   By: Acquanetta Belling M.D.   On: 09/17/2023 13:33   CT Angio Chest PE W and/or Wo Contrast Result Date: 09/16/2023 CLINICAL DATA:  Chest pain EXAM: CT ANGIOGRAPHY CHEST WITH CONTRAST TECHNIQUE: Multidetector CT imaging of the chest was performed using the standard protocol during bolus administration of intravenous contrast. Multiplanar CT image reconstructions and MIPs were obtained to evaluate the vascular anatomy. RADIATION DOSE REDUCTION: This exam was performed according to the departmental dose-optimization program which includes automated exposure control, adjustment of the mA and/or kV according to patient size and/or use of iterative reconstruction technique. CONTRAST:  75mL OMNIPAQUE IOHEXOL 350 MG/ML SOLN COMPARISON:  11/09/2022 FINDINGS: Cardiovascular: No filling defects in the pulmonary arteries to suggest pulmonary emboli. Cardiomegaly. Pacer wires in the right heart. Small to moderate pericardial effusion. Aorta normal caliber. Scattered coronary artery and aortic calcifications. Mediastinum/Nodes: No mediastinal, hilar, or axillary adenopathy. Trachea and esophagus are unremarkable. Thyroid unremarkable. Lungs/Pleura: Bibasilar scarring or atelectasis. No confluent opacities, apart edema, or effusions. Upper Abdomen: No acute findings Musculoskeletal: Chest  wall soft tissues are unremarkable. No acute bony abnormality. Review of the MIP images confirms the above findings. IMPRESSION: No evidence of pulmonary embolus. Cardiomegaly.  Small to moderate pericardial effusion. Coronary artery disease. Bibasilar atelectasis or scarring. Aortic Atherosclerosis (ICD10-I70.0). Electronically Signed   By: Charlett Nose M.D.   On: 09/16/2023 21:31   DG Chest Port 1 View Result Date: 09/16/2023 CLINICAL DATA:  Chest pain. EXAM: PORTABLE CHEST 1 VIEW COMPARISON:  08/08/2023. FINDINGS:  Bilateral lung fields are clear. Bilateral costophrenic angles are clear. Stable cardio-mediastinal silhouette. There is a left sided 3-lead pacemaker. No acute osseous abnormalities. Left lateral clavicular ORIF noted. The soft tissues are within normal limits. IMPRESSION: No active disease. Electronically Signed   By: Jules Schick M.D.   On: 09/16/2023 20:48   CUP PACEART INCLINIC DEVICE CHECK Result Date: 08/28/2023 Wound check appointment. Steri-strips removed. Wound without redness or edema. Incision edges approximated, wound well healed. Normal device function. Thresholds, sensing, and impedances consistent with implant measurements. Device programmed at 3.5V for  extra safety margin until 3 month visit. Histogram distribution appropriate for patient and level of activity. No mode switches or ventricular arrhythmias noted. Patient educated about wound care, arm mobility, lifting restrictions, shock plan. ROV in 3  months with implanting physician.Ancil Boozer, BSN, RN   Microbiology: Results for orders placed or performed during the hospital encounter of 09/16/23  Respiratory (~20 pathogens) panel by PCR     Status: None   Collection Time: 09/17/23  2:42 AM   Specimen: Nasopharyngeal Swab; Respiratory  Result Value Ref Range Status   Adenovirus NOT DETECTED NOT DETECTED Final   Coronavirus 229E NOT DETECTED NOT DETECTED Final    Comment: (NOTE) The Coronavirus on the Respiratory Panel, DOES NOT test for the novel  Coronavirus (2019 nCoV)    Coronavirus HKU1 NOT DETECTED NOT DETECTED Final   Coronavirus NL63 NOT DETECTED NOT DETECTED Final   Coronavirus OC43 NOT DETECTED NOT DETECTED Final   Metapneumovirus NOT DETECTED NOT DETECTED Final   Rhinovirus / Enterovirus NOT DETECTED NOT DETECTED Final   Influenza A NOT DETECTED NOT DETECTED Final   Influenza B NOT DETECTED NOT DETECTED Final   Parainfluenza Virus 1 NOT DETECTED NOT DETECTED Final   Parainfluenza Virus 2 NOT DETECTED NOT  DETECTED Final   Parainfluenza Virus 3 NOT DETECTED NOT DETECTED Final   Parainfluenza Virus 4 NOT DETECTED NOT DETECTED Final   Respiratory Syncytial Virus NOT DETECTED NOT DETECTED Final   Bordetella pertussis NOT DETECTED NOT DETECTED Final   Bordetella Parapertussis NOT DETECTED NOT DETECTED Final   Chlamydophila pneumoniae NOT DETECTED NOT DETECTED Final   Mycoplasma pneumoniae NOT DETECTED NOT DETECTED Final    Comment: Performed at [facility], [address] [place], [place] [address]  MRSA Next Gen by PCR, Nasal     Status: None   Collection Time: 09/17/23  4:52 AM   Specimen: Nasal Mucosa; Nasal Swab  Result Value Ref Range Status   MRSA by PCR Next Gen NOT DETECTED NOT DETECTED Final    Comment: (NOTE) The GeneXpert MRSA Assay (FDA approved for NASAL specimens only), is one component of a comprehensive MRSA colonization surveillance program. It is not intended to diagnose MRSA infection nor to guide or monitor treatment for MRSA infections. Test performance is not FDA approved in patients less than 78 years old. Performed at [facility], [address], [place], [place] [address]     Labs: CBC: Recent Labs  Lab 09/16/23 2024  09/17/23 0516  WBC 10.6* 10.7*  HGB 14.9 14.8  HCT 46.9 47.6  MCV 95.5 95.2  PLT 141* 148*   Basic Metabolic Panel: Recent Labs  Lab 09/17/23 0516 09/18/23 0814 09/19/23 0429 09/20/23 0425 09/21/23 0423 09/22/23 0504  NA 136 135 134* 134* 135 136  K 3.9 3.6 3.8 3.9 3.9 4.0  CL 98 99 100 102 100 102  CO2 27 26 25 25 25 25   GLUCOSE 212* 163* 191* 154* 169* 151*  BUN 17 25* 38* 46* 60* 61*  CREATININE 1.14 1.19 1.06 1.34* 1.57* 1.28*  CALCIUM 9.2 8.7* 8.7* 8.4* 8.6* 9.0  MG 2.0 2.3  --  2.4 2.3 2.5*  PHOS 3.7  --   --   --   --   --    Liver Function Tests: Recent Labs  Lab 09/16/23 2024  AST 15  ALT 19  ALKPHOS 61  BILITOT 0.9  PROT 7.1  ALBUMIN 3.5   CBG: Recent Labs  Lab 09/21/23 0801 09/21/23 1133  09/21/23 1627 09/21/23 2149 09/22/23 0728  GLUCAP 167* 281* 164* 245* 166*    Discharge time spent: greater than 30 minutes.  Signed: Catarina Hartshorn, MD [facility] 09/22/2023

## 2023-09-22 NOTE — Progress Notes (Signed)
Went over discharge instructions w/ pt and pt daughter Adan Sis.

## 2023-09-22 NOTE — Plan of Care (Signed)
  Problem: Education: Goal: Knowledge of General Education information will improve Description: Including pain rating scale, medication(s)/side effects and non-pharmacologic comfort measures Outcome: Completed/Met   Problem: Health Behavior/Discharge Planning: Goal: Ability to manage health-related needs will improve Outcome: Completed/Met   Problem: Clinical Measurements: Goal: Ability to maintain clinical measurements within normal limits will improve Outcome: Completed/Met Goal: Will remain free from infection Outcome: Completed/Met Goal: Diagnostic test results will improve Outcome: Completed/Met Goal: Respiratory complications will improve Outcome: Completed/Met Goal: Cardiovascular complication will be avoided Outcome: Completed/Met   Problem: Activity: Goal: Risk for activity intolerance will decrease Outcome: Completed/Met   Problem: Nutrition: Goal: Adequate nutrition will be maintained Outcome: Completed/Met   Problem: Coping: Goal: Level of anxiety will decrease Outcome: Completed/Met   Problem: Elimination: Goal: Will not experience complications related to bowel motility Outcome: Completed/Met Goal: Will not experience complications related to urinary retention Outcome: Completed/Met   Problem: Pain Management: Goal: General experience of comfort will improve Outcome: Completed/Met   Problem: Safety: Goal: Ability to remain free from injury will improve Outcome: Completed/Met   Problem: Skin Integrity: Goal: Risk for impaired skin integrity will decrease Outcome: Completed/Met   Problem: Education: Goal: Ability to describe self-care measures that may prevent or decrease complications (Diabetes Survival Skills Education) will improve Outcome: Completed/Met Goal: Individualized Educational Video(s) Outcome: Completed/Met   Problem: Coping: Goal: Ability to adjust to condition or change in health will improve Outcome: Completed/Met   Problem:  Fluid Volume: Goal: Ability to maintain a balanced intake and output will improve Outcome: Completed/Met   Problem: Health Behavior/Discharge Planning: Goal: Ability to identify and utilize available resources and services will improve Outcome: Completed/Met Goal: Ability to manage health-related needs will improve Outcome: Completed/Met   Problem: Metabolic: Goal: Ability to maintain appropriate glucose levels will improve Outcome: Completed/Met   Problem: Nutritional: Goal: Maintenance of adequate nutrition will improve Outcome: Completed/Met Goal: Progress toward achieving an optimal weight will improve Outcome: Completed/Met   Problem: Skin Integrity: Goal: Risk for impaired skin integrity will decrease Outcome: Completed/Met   Problem: Tissue Perfusion: Goal: Adequacy of tissue perfusion will improve Outcome: Completed/Met

## 2023-09-22 NOTE — TOC Transition Note (Signed)
Transition of Care [facility]) - Discharge Note   Patient Details  Name: Noah Hampton MRN: [id number] Date of Birth: 10/26/1950  Transition of Care [facility]) CM/SW Contact:  Karn Cassis, LCSW Phone Number: 09/22/2023, 10:34 AM   Clinical Narrative:  Pt d/c today. Discussed DME with pt's daughter who reports they will not need hospital bed, but request 3N1. No preference on agency. Referred and accepted by [facility] to drop ship to home. LCSW faxed d/c summary and home health orders to [facility] and confirmed received. [facility] aware pt is d/c today. No other needs reported.      Final next level of care: Home w Home Health Services Barriers to Discharge: Barriers Resolved   Patient Goals and CMS Choice Patient states their goals for this hospitalization and ongoing recovery are:: go home CMS Medicare.gov Compare Post Acute Care list provided to:: Patient Choice offered to / list presented to : Patient      Discharge Placement                    Patient and family notified of of transfer: 09/22/23  Discharge Plan and Services Additional resources added to the After Visit Summary for   In-house Referral: Clinical Social Work   Post Acute Care Choice: Home Health          DME Arranged: 3-N-1 DME Agency: [facility] Date DME Agency Contacted: 09/22/23 Time DME Agency Contacted: [id number] Representative spoke with at DME Agency: [facility] HH Arranged: RN, PT HH Agency: [facility] Date HH Agency Contacted: 09/19/23   Representative spoke with at [facility] Agency: Misty Stanley  Social Drivers of Health (SDOH) Interventions SDOH Screenings   Food Insecurity: No Food Insecurity (09/17/2023)  Housing: Low Risk  (09/17/2023)  Transportation Needs: No Transportation Needs (09/17/2023)  Utilities: Not At Risk (09/17/2023)  Tobacco Use: Low Risk  (09/17/2023)     Readmission Risk Interventions     No data to display

## 2023-09-22 NOTE — Progress Notes (Signed)
Refused cpap.

## 2023-09-23 ENCOUNTER — Telehealth: Payer: Self-pay | Admitting: *Deleted

## 2023-09-23 ENCOUNTER — Telehealth: Payer: Self-pay

## 2023-09-23 DIAGNOSIS — I509 Heart failure, unspecified: Secondary | ICD-10-CM

## 2023-09-23 DIAGNOSIS — E119 Type 2 diabetes mellitus without complications: Secondary | ICD-10-CM

## 2023-09-23 NOTE — Progress Notes (Signed)
 Complex Care Management Note Care Guide Note  09/23/2023 Name: Noah Hampton MRN: [id number] DOB: 12/21/1950   Complex Care Management Outreach Attempts: An unsuccessful telephone outreach was attempted today to offer the patient information about available complex care management services.  Follow Up Plan:  Additional outreach attempts will be made to offer the patient complex care management information and services.   Encounter Outcome:  No Answer  Thedford Franks, CCMA Care Coordination Care Guide Direct Dial: [phone]

## 2023-09-25 MED ORDER — APIXABAN 5 MG PO TABS: 5.0000 mg | ORAL_TABLET | Freq: Two times a day (BID) | ORAL | 3 refills | Status: DC

## 2023-09-25 NOTE — Telephone Encounter (Signed)
 Epic chat sent  to AM.

## 2023-09-25 NOTE — Telephone Encounter (Signed)
 Restarted Eliquis per AM at 5mg  BID after reviewing w/ AT. Rx sent to pt pharmacy. Pt rescheduled from 10/14/23 to 10/07/23 w/ E. Peck in [place]. Advised pt of changes.

## 2023-09-26 ENCOUNTER — Telehealth: Payer: Self-pay | Admitting: Internal Medicine

## 2023-09-26 NOTE — Telephone Encounter (Signed)
 Patient calling back but could not answer questions.  States he weighs daily but could not give me any numbers.  States his wife & daughter is at work.  Advised patient that I will call back later this evening to give wife time to get home.  He verbalized understanding.

## 2023-09-26 NOTE — Telephone Encounter (Signed)
 Wife Myrene Buddy) notified and verbalized understanding.  She will have son fax [place] copy of his POA - number provided.

## 2023-09-26 NOTE — Telephone Encounter (Signed)
 Daughter Amie) stated patient has been having a lot of weight gain since being released from the hospital on Monday, 12/30.  Daughter stated patient weighed 243 lbs on discharge and now weighs 250 lbs.  Daughter stated patient has been complaining of pain in legs and noted she does not live near patient and could not confirm patient is having swelling.  Daughter wants advice on next steps

## 2023-09-26 NOTE — Telephone Encounter (Signed)
 Returned call to patient & left message to call back.  Daughter Lyndel Safe) placed call initially but she is not listed on patient's DPR - only his spouse Darwyn Ponzo).

## 2023-09-26 NOTE — Telephone Encounter (Signed)
 Spoke with wife Treasa) - states weights have steadily been going up -   243 248 253 & this morning 250lb  No BP checks done at home.   Wife c/o patient having a lot of confusion, slurred speech & just recently figured out that he hadn't paid the bills in 3 months.  States this is not like him at all.  States the son lives out of town & would like to have say in his care as well.  Advised her to bring copy of POA so we will have confirmation that we can speak with him as well.   States she has scheduled him to see his pcp on 10/09/2023 & has appointment for hosp f/u here on 10/07/2023.    Suggested that she may want take him to [facility] in the meantime due to change in mental status as she states she did not want to go back to [facility] due to bad experience this last visit there.

## 2023-09-29 NOTE — Progress Notes (Signed)
 Complex Care Management Note  Care Guide Note 09/29/2023 Name: Nawaf Strange MRN: [id number] DOB: December 19, 1950  Trigger Frasier is a 73 y.o. year old male who sees David, Michael K, DO for primary care. I reached out to Manases Allbritton by phone today to offer complex care management services.  Mr. Bennis was given information about Complex Care Management services today including:   The Complex Care Management services include support from the care team which includes your Nurse Coordinator, Clinical Social Worker, or Pharmacist.  The Complex Care Management team is here to help remove barriers to the health concerns and goals most important to you. Complex Care Management services are voluntary, and the patient may decline or stop services at any time by request to their care team member.   Complex Care Management Consent Status: Patient agreed to services and verbal consent obtained.   Follow up plan:  Telephone appointment with complex care management team member scheduled for:  09/30/2023  Encounter Outcome:  Patient Scheduled  Thedford Franks, [facility] Care Coordination Care Guide Direct Dial: [phone]

## 2023-09-30 ENCOUNTER — Other Ambulatory Visit: Payer: Self-pay | Admitting: *Deleted

## 2023-10-02 ENCOUNTER — Ambulatory Visit: Payer: BC Managed Care – PPO | Admitting: Internal Medicine

## 2023-10-07 ENCOUNTER — Ambulatory Visit: Payer: Medicare Other | Admitting: Nurse Practitioner

## 2023-10-14 ENCOUNTER — Ambulatory Visit: Payer: Medicare Other | Admitting: Nurse Practitioner

## 2023-10-14 ENCOUNTER — Telehealth: Payer: Self-pay

## 2023-10-14 NOTE — Telephone Encounter (Signed)
Repeat device alert for AT/AT burden > threshold AF in progress from 12/28, controlled rates.  Burden 50%, Eliquis per PA report Route to clinic for plan, if rate control strategy will discontinue AF alerts LA, CVRS   LV lead also is above 3.0V   Patient cancelled his last appt in [place]. Spoke to patient.  He is doing well no sx: SOB fatigue or other sx.  Appt made with [name] to check device (ie:  LV threshold) and AF burden, consider alert adjustment: 11/07/23 1:30pm. Patient is aware.

## 2023-11-04 ENCOUNTER — Telehealth: Payer: Self-pay | Admitting: Cardiovascular Disease

## 2023-11-04 NOTE — Telephone Encounter (Signed)
Noah Hampton -[facility] calling to see if ok for pt to  have an MRI-pt has a defibrillator requesting cb to [phone] fax [phone] [facility] or [phone] [facility] (if faxing please send to both #'s)

## 2023-11-04 NOTE — Telephone Encounter (Signed)
Patient's Abbott BIV ICD generator and leads ( whole system) is MRI compatible.

## 2023-11-05 NOTE — Telephone Encounter (Signed)
Received form, returned clearance fax form.

## 2023-11-05 NOTE — Telephone Encounter (Signed)
LM for Noah Hampton to fax [place] a request form [phone].  Device clinic number given to call back if any concerns.

## 2023-11-07 ENCOUNTER — Ambulatory Visit (INDEPENDENT_AMBULATORY_CARE_PROVIDER_SITE_OTHER): Payer: Medicare Other

## 2023-11-07 ENCOUNTER — Ambulatory Visit: Payer: Medicare Other | Admitting: Cardiovascular Disease

## 2023-11-07 DIAGNOSIS — I447 Left bundle-branch block, unspecified: Secondary | ICD-10-CM | POA: Diagnosis not present

## 2023-11-13 LAB — CUP PACEART REMOTE DEVICE CHECK
Battery Remaining Longevity: 81 mo
Battery Remaining Percentage: 93 %
Battery Voltage: 2.99 V
Brady Statistic AP VP Percent: 15 %
Brady Statistic AP VS Percent: 1 %
Brady Statistic AS VP Percent: 85 %
Brady Statistic AS VS Percent: 1 %
Brady Statistic RA Percent Paced: 4.6 %
Date Time Interrogation Session: 20250219120308
HighPow Impedance: 71 Ohm
Implantable Lead Connection Status: 753985
Implantable Lead Connection Status: 753985
Implantable Lead Connection Status: 753985
Implantable Lead Implant Date: 20241114
Implantable Lead Implant Date: 20241114
Implantable Lead Implant Date: 20241114
Implantable Lead Location: 753858
Implantable Lead Location: 753859
Implantable Lead Location: 753860
Implantable Pulse Generator Implant Date: 20241114
Lead Channel Impedance Value: 410 Ohm
Lead Channel Impedance Value: 440 Ohm
Lead Channel Impedance Value: 700 Ohm
Lead Channel Pacing Threshold Amplitude: 1 V
Lead Channel Pacing Threshold Amplitude: 1 V
Lead Channel Pacing Threshold Amplitude: 2.25 V
Lead Channel Pacing Threshold Pulse Width: 0.5 ms
Lead Channel Pacing Threshold Pulse Width: 0.5 ms
Lead Channel Pacing Threshold Pulse Width: 0.5 ms
Lead Channel Sensing Intrinsic Amplitude: 11.5 mV
Lead Channel Sensing Intrinsic Amplitude: 2.6 mV
Lead Channel Setting Pacing Amplitude: 1.5 V
Lead Channel Setting Pacing Amplitude: 2 V
Lead Channel Setting Pacing Amplitude: 3.75 V
Lead Channel Setting Pacing Pulse Width: 0.5 ms
Lead Channel Setting Pacing Pulse Width: 0.5 ms
Lead Channel Setting Sensing Sensitivity: 0.5 mV
Pulse Gen Serial Number: 211030161
Zone Setting Status: 755011

## 2023-11-28 ENCOUNTER — Ambulatory Visit: Payer: Medicare Other | Admitting: Cardiovascular Disease

## 2023-12-08 NOTE — Progress Notes (Signed)
 Remote ICD transmission.

## 2023-12-08 NOTE — Addendum Note (Signed)
 Addended by: Elease Etienne A on: 12/08/2023 11:24 AM   Modules accepted: Orders

## 2023-12-25 ENCOUNTER — Ambulatory Visit: Payer: Medicare Other | Admitting: Internal Medicine

## 2024-01-05 ENCOUNTER — Telehealth: Payer: Self-pay

## 2024-01-05 NOTE — Telephone Encounter (Signed)
 LMTRC.  Assess sx's (Corvue - AF, decreased BV pacing). PAST DUE for 91 day device check with [facility] Needs to see gen cards and f/u on HF as well.   He has no showed or cancelled multiple recent appts with EP and gen cards.

## 2024-01-05 NOTE — Telephone Encounter (Signed)
 Alert CRT-D remote transmission:  AT/AF burden > threshold Known persistent AF, overall controlled rates, Eliquis per PA report Known sub-optimal BiV pacing, 74%, consistent with trends   Forwarding to provider to make aware that CV has turned off the following alerts: Program AF burden off, follow on 91 day reports,  BiV pacing alert programmed off.    I also note patient's Corvue shows signs of fluid retention for past 13 days.  Also, patient is way past due for follow up appts. (No showed then canceled last 2 appts for 91 day post op checks).  Needs to be seen.

## 2024-01-06 NOTE — Telephone Encounter (Signed)
 Spoke with wife.  He doesn't answer his phone.   She reports that he has noticeable increase in BLE. Always has SOB on exertion - hasn't noticed it being any different.    Forwarding to [place]/ Mallipeddi's office as he is past due for gen card/HF follow up.  Concern for possible acute exacerbation.  High risk Heart Failure logistics on device.    Set up 91 post device implant f/u with Dr. Arlester Ladd in [place] on 01/23/24.  They cannot travel to [place].  He has no shown/cancelled last 2 in office appts.  (Need to evaluate device).

## 2024-01-06 NOTE — Telephone Encounter (Signed)
 Left message for wife to return call - Woodfin Hays, PA has opening on Thursday in [place] - will await call back in the morning.

## 2024-01-08 ENCOUNTER — Ambulatory Visit: Attending: Student | Admitting: Student

## 2024-01-08 ENCOUNTER — Encounter: Payer: Self-pay | Admitting: Student

## 2024-01-08 VITALS — BP 98/62 | HR 70 | Ht 68.0 in | Wt 249.0 lb

## 2024-01-08 DIAGNOSIS — I2510 Atherosclerotic heart disease of native coronary artery without angina pectoris: Secondary | ICD-10-CM

## 2024-01-08 DIAGNOSIS — I3139 Other pericardial effusion (noninflammatory): Secondary | ICD-10-CM | POA: Diagnosis not present

## 2024-01-08 DIAGNOSIS — I5022 Chronic systolic (congestive) heart failure: Secondary | ICD-10-CM | POA: Diagnosis not present

## 2024-01-08 DIAGNOSIS — I4819 Other persistent atrial fibrillation: Secondary | ICD-10-CM | POA: Diagnosis not present

## 2024-01-08 DIAGNOSIS — I10 Essential (primary) hypertension: Secondary | ICD-10-CM

## 2024-01-08 DIAGNOSIS — Z79899 Other long term (current) drug therapy: Secondary | ICD-10-CM

## 2024-01-08 DIAGNOSIS — Z95810 Presence of automatic (implantable) cardiac defibrillator: Secondary | ICD-10-CM

## 2024-01-08 MED ORDER — TORSEMIDE 20 MG PO TABS: 60.0000 mg | ORAL_TABLET | Freq: Every day | ORAL | 5 refills | Status: AC

## 2024-01-08 MED ORDER — ENTRESTO 49-51 MG PO TABS: 1.0000 | ORAL_TABLET | Freq: Two times a day (BID) | ORAL | 11 refills | Status: AC

## 2024-01-08 MED ORDER — APIXABAN 5 MG PO TABS
5.0000 mg | ORAL_TABLET | Freq: Two times a day (BID) | ORAL | 3 refills | Status: AC
Start: 2024-01-08 — End: ?

## 2024-01-08 NOTE — Telephone Encounter (Signed)
 Appointment has been scheduled.

## 2024-01-08 NOTE — Patient Instructions (Signed)
 Medication Instructions:   Your physician has recommended you make the following change in your medication:   Stop Taking Aspirin  Start Taking Eliquis 5 mg Two Times Daily  Decrease Entresto to 49-51 mg Two Times Daily  Increase Torsemide to 60 mg Daily    *If you need a refill on your cardiac medications before your next appointment, please call your pharmacy*  Lab Work: Your physician recommends that you return for lab work in: 2 Weeks ( BMET)   If you have labs (blood work) drawn today and your tests are completely normal, you will receive your results only by: [employer] (if you have MyChart) OR A paper copy in the mail If you have any lab test that is abnormal or we need to change your treatment, we will call you to review the results.  Testing/Procedures: Your physician has requested that you have an echocardiogram. Echocardiography is a painless test that uses sound waves to create images of your heart. It provides your doctor with information about the size and shape of your heart and how well your heart's chambers and valves are working. This procedure takes approximately one hour. There are no restrictions for this procedure. Please do NOT wear cologne, perfume, aftershave, or lotions (deodorant is allowed). Please arrive 15 minutes prior to your appointment time.  Please note: We ask at that you not bring children with you during ultrasound (echo/ vascular) testing. Due to room size and safety concerns, children are not allowed in the ultrasound rooms during exams. Our front office staff cannot provide observation of children in our lobby area while testing is being conducted. An adult accompanying a patient to their appointment will only be allowed in the ultrasound room at the discretion of the ultrasound technician under special circumstances. We apologize for any inconvenience.   Follow-Up: At [facility], you and your health needs are our priority.  As part  of our continuing mission to provide you with exceptional heart care, our providers are all part of one team.  This team includes your primary Cardiologist (physician) and Advanced Practice Providers or APPs (Physician Assistants and Nurse Practitioners) who all work together to provide you with the care you need, when you need it.  Your next appointment:   4 -6 week(s)  Provider:   Vishnu Mallipeddi, MD    We recommend signing up for the patient portal called "MyChart".  Sign up information is provided on this After Visit Summary.  MyChart is used to connect with patients for Virtual Visits (Telemedicine).  Patients are able to view lab/test results, encounter notes, upcoming appointments, etc.  Non-urgent messages can be sent to your provider as well.   To learn more about what you can do with MyChart, go to [email or web address].   Other Instructions Thank you for choosing [facility]!

## 2024-01-08 NOTE — Telephone Encounter (Signed)
 Spoke with wife this morning - she asked that we call her back after 9 so she can check her work schedule.  She will do her best to work it out.

## 2024-01-08 NOTE — Progress Notes (Signed)
 Cardiology Office Note    Date:  01/08/2024  ID:  Noah Hampton, DOB 11/02/1950, MRN [id number] Cardiologist: Marjo Bicker, MD   EP: [name]  History of Present Illness:    Noah Hampton is a 73 y.o. male with past medical history of chronic HFmrEF/NICM (EF 25-30% by echo in 10/2022, at 30-35% in 05/2023, underwent implantation of Abbott BiV ICD in 07/2023, EF at 45-50% by echo in 08/2023), CAD (cath in 12/2022 showing mild to moderate nonobstructive CAD), LBBB, HTN, HLD, IDDM, history of DVT and OSA who presents to the office today for overdue follow-up.   He was last examined by Cardiology during an admission in 08/2023 for an acute CHF exacerbation as he had been directly admitted from the office after having a 16 lb weight gain. Repeat echo showed his EF was at 45-50% but he did have a moderate pericardial effusion with no evidence of tamponade. He responded well to IV Lasix and weight had improved to 260 lbs at discharge. Was discharged on Coreg 6.25mg  BID, Farxiga 10mg  daily, Entresto 97-103mg  BID, Spironolactone 25mg  daily, Repatha and Torsemide 40mg  daily. He had follow-up in clinic the next week but canceled the appointment and has canceled or no-showed for several visits since.   By review of phone notes, his device check in 08/2023 showed episodes of AF with a burden of 3.1%. Was started on Eliquis 5mg  BID by [name]. Recent device check earlier this month was concerning for worsening CHF and a follow-up was arranged.   In talking with the patient and his wife today, there is a lot of confusion regarding his medications as they report he was staying with his daughter for a few months and she was managing medications at that time. He has not been on Eliquis. Has been taking his other cardiac medications except for Repatha as he reports not tolerating this well but is unsure of the exact reaction. Has been experiencing intermittent dizziness and BP is at 98/62 during today's visit.   Does not check his blood pressure routinely at home. Says that his weight has increased by at least 5 pounds within the past few weeks. Has noticed worsening dyspnea on exertion and abdominal distention during this timeframe as well. No specific orthopnea or PND. Does not use his CPAP routinely and compliance was encouraged. No recent exertional chest pain or palpitations.  Studies Reviewed:   EKG: EKG is ordered today and demonstrates:   EKG Interpretation Date/Time:  Thursday January 08 2024 14:21:47 EDT Ventricular Rate:  70 PR Interval:    QRS Duration:  132 QT Interval:  448 QTC Calculation: 483 R Axis:   203  Text Interpretation: Ventricular-paced rhythm Confirmed by Randall An ([id number]) on 01/08/2024 7:58:34 PM       Cardiac Catheterization: 12/2022   Prox RCA to Mid RCA lesion is 20% stenosed.   Ost Cx to Prox Cx lesion is 30% stenosed.   1st Mrg lesion is 30% stenosed.   Mid LAD lesion is 40% stenosed.   Mild to moderate non-obstructive plaque in the mid LAD (40% stenosis) Mild plaque in the proximal Circumflex and ostium of the first obtuse marginal branch (30% stenosis) Large dominant RCA with mild plaque in the mid vessel.  LVEDP=19 mmHg   Recommendations: He does not have obstructive CAD. Medical management of CAD. Medical management of non-ischemic cardiomyopathy. Resume Eliquis tomorrow morning if no bleeding from cath site in right wrist.     Echocardiogram: 08/2023 IMPRESSIONS  1. Left ventricular ejection fraction, by estimation, is 45 to 50%. The  left ventricle has mildly decreased function. The left ventricle  demonstrates global hypokinesis. There is mild concentric left ventricular  hypertrophy. Indeterminate diastolic  filling due to E-A fusion.   2. Right ventricular systolic function is normal. The right ventricular  size is normal. Tricuspid regurgitation signal is inadequate for assessing  PA pressure.   3. Moderate pericardial effusion.  The pericardial effusion is  circumferential. There is no evidence of cardiac tamponade.   4. The mitral valve was not well visualized. No evidence of mitral valve  regurgitation.   5. The aortic valve is tricuspid. There is mild calcification of the  aortic valve. Aortic valve regurgitation is not visualized.   6. The inferior vena cava is dilated in size with >50% respiratory  variability, suggesting right atrial pressure of 8 mmHg.   Conclusion(s)/Recommendation(s): EF has improved compared to prior study.  Has pericardial effusion, no echocardiographic signs of cardiac tamponade.   Risk Assessment/Calculations:    CHA2DS2-VASc Score = 5   This indicates a 7.2% annual risk of stroke. The patient's score is based upon: CHF History: 1 HTN History: 1 Diabetes History: 1 Stroke History: 0 Vascular Disease History: 1 Age Score: 1 Gender Score: 0     Physical Exam:   VS:  BP 98/62 (BP Location: Left Arm, Cuff Size: Large)   Pulse 70   Ht 5\' 8"  (1.727 m)   Wt 249 lb (112.9 kg)   SpO2 96%   BMI 37.86 kg/m    Wt Readings from Last 3 Encounters:  01/08/24 249 lb (112.9 kg)  09/22/23 260 lb 9.3 oz (118.2 kg)  09/16/23 268 lb 12.8 oz (121.9 kg)     GEN: Well nourished, well developed male appearing in no acute distress NECK: No JVD; No carotid bruits CARDIAC: Irregularly irregular, no murmurs, rubs, gallops RESPIRATORY:  Clear to auscultation without rales, wheezing or rhonchi  ABDOMEN: Appears distended. No obvious abdominal masses. EXTREMITIES: No clubbing or cyanosis. Trace lower extremity edema bilaterally.  Distal pedal pulses are 2+ bilaterally.   Assessment and Plan:   1. Chronic HFmrEF/Pericardial Effusion - His EF was previously as low as 25-30% by echo in 10/2022, improved to 45-50% by most recent echo in 08/2023.  Echo in 08/2023 showed a moderate pericardial effusion with no evidence of tamponade. - Recent device interrogation suggested fluid accumulation and  he does report worsening dyspnea on exertion and abdominal distention. He has been taking Torsemide 40 mg daily and will titrate to 60 mg daily. Given soft BP and to allow for further adjustment of Torsemide, will reduce Entresto to 49-51 mg twice daily. Continue Coreg 6.25 mg twice daily, Farxiga 10 mg daily and Spironolactone 25 mg daily. Repeat BMET in 2 weeks. Will also obtain a repeat echocardiogram for reassessment of his pericardial effusion.  2. ICD in place - He does have an Abbott BiV ICD in place which is followed by Dr. Arlester Ladd. Overdue for follow-up and this has been rescheduled for 01/23/2024.  3. Persistent Atrial Fibrillation/Flutter - EKG today shows AV paced rhythm and he has underlying AF. He denies any recent palpitations. Continue Coreg 6.25 mg twice daily for rate control. He does have scheduled follow-up with the EP in 2 weeks and rhythm control strategies can be reviewed at that time. - Reviewed indications for Eliquis with the patient and will restart Eliquis 5 mg twice daily for anticoagulation. This is the appropriate dose given his  current age, weight and renal function.   4. CAD/HLD - Prior cath in 12/2022 showed mild to moderate nonobstructive CAD. He has baseline dyspnea on exertion but denies any recent chest pain. He has been intolerant to high intensity statins due to myalgias but is tolerating Pitavastatin 2 mg daily at this time. Previously stopped Repatha as discussed above. - Given the use of Eliquis, would stop ASA 81 mg daily for now.  5. HTN - BP is actually soft at 98/62 during today's visit. Given his associated dizziness at times, will reduce Entresto from 97-103 mg twice daily to 49-51 mg twice daily. Continue Coreg 6.25 mg twice daily and Spironolactone 25 mg daily.  Will recheck a BMET in 2 weeks.    Signed, Dorma Gash, PA-C

## 2024-01-09 ENCOUNTER — Telehealth: Payer: Self-pay | Admitting: Internal Medicine

## 2024-01-09 NOTE — Telephone Encounter (Signed)
 Daughter Lovett Ruck) wants a call back to discuss update on patient's visit yesterday.

## 2024-01-09 NOTE — Addendum Note (Signed)
 Addended by: Nyelle Wolfson G on: 01/09/2024 11:56 AM   Modules accepted: Orders

## 2024-01-09 NOTE — Telephone Encounter (Signed)
 Spoke with daughter and pt. ( Okay per pt) Reviewed med list with pt and daughter. All questions answered.

## 2024-01-23 ENCOUNTER — Encounter: Admitting: Cardiovascular Disease

## 2024-01-23 ENCOUNTER — Ambulatory Visit: Attending: Cardiovascular Disease | Admitting: Cardiovascular Disease

## 2024-01-23 ENCOUNTER — Encounter: Payer: Self-pay | Admitting: Cardiovascular Disease

## 2024-01-23 VITALS — BP 130/76 | HR 69 | Ht 68.0 in | Wt 245.0 lb

## 2024-01-23 DIAGNOSIS — I5022 Chronic systolic (congestive) heart failure: Secondary | ICD-10-CM | POA: Diagnosis not present

## 2024-01-23 DIAGNOSIS — Z01812 Encounter for preprocedural laboratory examination: Secondary | ICD-10-CM

## 2024-01-23 DIAGNOSIS — I4819 Other persistent atrial fibrillation: Secondary | ICD-10-CM

## 2024-01-23 DIAGNOSIS — I4892 Unspecified atrial flutter: Secondary | ICD-10-CM | POA: Diagnosis not present

## 2024-01-23 NOTE — Progress Notes (Signed)
 Electrophysiology Office Note:    Date:  01/23/2024   ID:  Noah Hampton, DOB 02-25-1951, MRN [id number]  PCP:  Darlis Eisenmenger, DO   [facility] Providers Cardiologist:  Lasalle Pointer, MD Electrophysiologist:  Efraim Grange, MD     Referring MD: Darlis Eisenmenger, DO   History of Present Illness:    Noah Hampton is a 73 y.o. 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     Discussed the use of AI scribe software for clinical note transcription with the patient, who gave verbal consent to proceed.   He was diagnosed with acute systolic heart failure in February 2024 the patient's ejection fraction was initially 25-30% and has remained below 35% despite medical management. The patient reports persistent fatigue and frequent urination, which is distressing and disruptive to his daily life. The patient's spouse also notes that the patient has been out of work for a long time due to his health condition, which has led to feelings of depression. The patient expresses eagerness to improve his quality of life and reduce his symptoms.  He underwent placement of a BiV ICD on August 07, 2023.  His EF recovered to 45 to 50% quickly.  Unfortunately, he developed atrial flutter in late December 2025, and has remained persistently in flutter since.  Eliquis  was started.  He has had progressive symptoms of shortness of breath fatigue.     Today, he has fatigue, shortness of breath -- at baseline  EKGs/Labs/Other Studies Reviewed Today:     Echocardiogram:  TTE November 11, 2022 EF 25 to 30%.  Grade 1 diastolic dysfunction.  Left atrium moderately dilated.  TTE May 27, 2023 EF 30 to 35%.  Grade 1 diastolic dysfunction.  Small pericardial effusion.  Normal biatrial size.  TTE September 17, 2023 EF 45 to 50%.   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, EF 30-35% EF is persistently severely reduced despite optimal GDMT His symptoms are NYHA II-III He meets criteria for defibrillator and CRT Continue carvedilol  6.25 Farxiga  10 mg, Entresto  49-51, spironolactone  25  Atrial flutter Associated with fatigue, dyspnea Rate control, but limiting optimal BiV pacing We discussed management options, and using the shared decision making approach we will plan to proceed with ablation Will schedule DC cardioversion pending ablation  We discussed the indication, rationale, logistics, anticipated benefits, and potential risks of the ablation procedure including but not limited to -- bleed at the groin access site, chest pain, damage to nearby organs such as the diaphragm, lungs, or esophagus, need for a drainage tube, or prolonged hospitalization. I explained that the risk for stroke, heart attack, need for open chest surgery,  or even death is very low but not zero. he  expressed understanding and wishes to proceed.   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  See Paceart for details He is not device dependent Suboptimal CRT due to atrial flutter  History of proximal lower extremity DVT February 2024 Continue Eliquis  5 mg twice daily     Signed, Andric Kerce E Freddy Kinne, MD  01/23/2024 12:23 PM    [facility]

## 2024-01-23 NOTE — Patient Instructions (Addendum)
 Medication Instructions:   Continue all current medications.   Labwork:  BMET, CBC - orders given today   Testing/Procedures:  Your physician has recommended that you have a Cardioversion (DCCV). Electrical Cardioversion uses a jolt of electricity to your heart either through paddles or wired patches attached to your chest. This is a controlled, usually prescheduled, procedure. Defibrillation is done under light anesthesia in the hospital, and you usually go home the day of the procedure. This is done to get your heart back into a normal rhythm. You are not awake for the procedure. Please see the instruction sheet given to you today. Your physician has recommended that you have an ablation. Catheter ablation is a medical procedure used to treat some cardiac arrhythmias (irregular heartbeats). During catheter ablation, a long, thin, flexible tube is put into a blood vessel in your groin (upper thigh), or neck. This tube is called an ablation catheter. It is then guided to your heart through the blood vessel. Radio frequency waves destroy small areas of heart tissue where abnormal heartbeats may cause an arrhythmia to start. Please see the instruction sheet given to you today.   Follow-Up:  Pending  Any Other Special Instructions Will Be Listed Below (If Applicable).   If you need a refill on your cardiac medications before your next appointment, please call your pharmacy.

## 2024-01-23 NOTE — H&P (View-Only) (Signed)
 Electrophysiology Office Note:    Date:  01/23/2024   ID:  Noah Hampton, DOB 02-25-1951, MRN [id number]  PCP:  Darlis Eisenmenger, DO   [facility] Providers Cardiologist:  Lasalle Pointer, MD Electrophysiologist:  Efraim Grange, MD     Referring MD: Darlis Eisenmenger, DO   History of Present Illness:    Noah Hampton is a 73 y.o. 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     Discussed the use of AI scribe software for clinical note transcription with the patient, who gave verbal consent to proceed.   He was diagnosed with acute systolic heart failure in February 2024 the patient's ejection fraction was initially 25-30% and has remained below 35% despite medical management. The patient reports persistent fatigue and frequent urination, which is distressing and disruptive to his daily life. The patient's spouse also notes that the patient has been out of work for a long time due to his health condition, which has led to feelings of depression. The patient expresses eagerness to improve his quality of life and reduce his symptoms.  He underwent placement of a BiV ICD on August 07, 2023.  His EF recovered to 45 to 50% quickly.  Unfortunately, he developed atrial flutter in late December 2025, and has remained persistently in flutter since.  Eliquis  was started.  He has had progressive symptoms of shortness of breath fatigue.     Today, he has fatigue, shortness of breath -- at baseline  EKGs/Labs/Other Studies Reviewed Today:     Echocardiogram:  TTE November 11, 2022 EF 25 to 30%.  Grade 1 diastolic dysfunction.  Left atrium moderately dilated.  TTE May 27, 2023 EF 30 to 35%.  Grade 1 diastolic dysfunction.  Small pericardial effusion.  Normal biatrial size.  TTE September 17, 2023 EF 45 to 50%.   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, EF 30-35% EF is persistently severely reduced despite optimal GDMT His symptoms are NYHA II-III He meets criteria for defibrillator and CRT Continue carvedilol  6.25 Farxiga  10 mg, Entresto  49-51, spironolactone  25  Atrial flutter Associated with fatigue, dyspnea Rate control, but limiting optimal BiV pacing We discussed management options, and using the shared decision making approach we will plan to proceed with ablation Will schedule DC cardioversion pending ablation  We discussed the indication, rationale, logistics, anticipated benefits, and potential risks of the ablation procedure including but not limited to -- bleed at the groin access site, chest pain, damage to nearby organs such as the diaphragm, lungs, or esophagus, need for a drainage tube, or prolonged hospitalization. I explained that the risk for stroke, heart attack, need for open chest surgery,  or even death is very low but not zero. he  expressed understanding and wishes to proceed.   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  See Paceart for details He is not device dependent Suboptimal CRT due to atrial flutter  History of proximal lower extremity DVT February 2024 Continue Eliquis  5 mg twice daily     Signed, Andric Kerce E Freddy Kinne, MD  01/23/2024 12:23 PM    [facility]

## 2024-01-29 ENCOUNTER — Other Ambulatory Visit (HOSPITAL_COMMUNITY)

## 2024-01-30 ENCOUNTER — Encounter: Payer: Self-pay | Admitting: *Deleted

## 2024-01-30 ENCOUNTER — Telehealth: Payer: Self-pay | Admitting: Cardiology

## 2024-01-30 ENCOUNTER — Telehealth: Payer: Self-pay

## 2024-01-30 DIAGNOSIS — I4819 Other persistent atrial fibrillation: Secondary | ICD-10-CM

## 2024-01-30 NOTE — Progress Notes (Addendum)
 Arranging pre-procedure orders for DC cardioversion per [facility], LPN's recommendations. Has been given okay to do this per Dr. Arlester Ladd.   These orders are under signed and held.   Lasalle Pointer, NP

## 2024-01-30 NOTE — Addendum Note (Signed)
 Addended by: Lasalle Pointer on: 01/30/2024 04:36 PM   Modules accepted: Orders

## 2024-01-30 NOTE — Patient Instructions (Signed)
 Noah Hampton  01/30/2024     @PREFPERIOPPHARMACY @   Your procedure is scheduled on  02/04/2024.   Report to [facility] at  0815  A.M.   Call this number if you have problems the morning of surgery:  [phone]  If you experience any cold or flu symptoms such as cough, fever, chills, shortness of breath, etc. between now and your scheduled surgery, please notify us  at the above number.   Remember:  Do not eat after midnight.   You may drink clear liquids until  0615 am on 02/04/2024.    Clear liquids allowed are:                    Water , Juice (No red color; non-citric and without pulp; diabetics please choose diet or no sugar options), Carbonated beverages (diabetics please choose diet or no sugar options), Clear Tea (No creamer, milk, or cream, including half & half and powdered creamer), Black Coffee Only (No creamer, milk or cream, including half & half and powdered creamer), and Clear Sports drink (No red color; diabetics please choose diet or no sugar options)    Take these medicines the morning of surgery with A SIP OF WATER                         clonazepam , escitalopram , entresto .    Do not wear jewelry, make-up or nail polish, including gel polish,  artificial nails, or any other type of covering on natural nails (fingers and  toes).  Do not wear lotions, powders, or perfumes, or deodorant.  Do not shave 48 hours prior to surgery.  Men may shave face and neck.  Do not bring valuables to the hospital.  [facility] is not responsible for any belongings or valuables.  Contacts, dentures or bridgework may not be worn into surgery.  Leave your suitcase in the car.  After surgery it may be brought to your room.  For patients admitted to the hospital, discharge time will be determined by your treatment team.  Patients discharged the day of surgery will not be allowed to drive home and must have someone with them for 24 hours.    Special instructions:   DO NOT smoke  tobacco or vape for 24 hours before your procedure.  Please read over the following fact sheets that you were given. Anesthesia Post-op Instructions and Care and Recovery After Surgery       Electrical Cardioversion Electrical cardioversion is the delivery of a jolt of electricity to restore a normal rhythm to the heart. A rhythm that is too fast or is not regular (arrhythmia) keeps the heart from pumping blood well. There is also another type of cardioversion called a chemical (pharmacologic) cardioversion. This is when your health care provider gives you one or more medicines to bring back your regular heart rhythm. Electrical cardioversion is done as a scheduled procedure for arrhythmiasthat are not life-threatening. Electrical cardioversion may also be done in an emergency for sudden life-threatening arrhythmias. Tell a health care provider about: Any allergies you have. All medicines you are taking, including vitamins, herbs, eye drops, creams, and over-the-counter medicines. Any problems you or family members have had with sedatives or anesthesia. Any bleeding problems you have. Any surgeries you have had, including a pacemaker, defibrillator, or other implanted device. Any medical conditions you have. Whether you are pregnant or may be pregnant. What are the risks? Your provider will talk  with you about risks. These include: Allergic reactions to medicines. Irritation to the skin on your chest or back where the sticky pads (electrodes) or paddles were put during electrical cardioversion. A blood clot that breaks free and travels to other parts of your body, such as your brain. Return of a worse abnormal heart rhythm that will need to be treated with medicines, a pacemaker, or an implantable cardioverter defibrillator (ICD). What happens before the procedure? Medicines Your provider may give you: Blood-thinning medicines (anticoagulants) so your blood does not clot as easily. If  your provider gives you this medicine, you may need to take it for 4 weeks before the procedure. Medicines to help stabilize your heart rate and rhythm. Ask your provider about: Changing or stopping your regular medicines. These include any diabetes medicines or blood thinners you take. Taking medicines such as aspirin  and ibuprofen. These medicines can thin your blood. Do not take them unless your provider tells you to. Taking over-the-counter medicines, vitamins, herbs, and supplements. General instructions Follow instructions from your provider about what you may eat and drink. Do not put any lotions, powders, or ointments on your chest and back for 24 hours before the procedure. They can cause problems with the electrodes or paddles used to deliver electricity to your heart. Do not wear jewelry as this can interfere with delivering electricity to your heart. If you will be going home right after the procedure, plan to have a responsible adult: Take you home from the hospital or clinic. You will not be allowed to drive. Care for you for the time you are told. Tests You may have an exam or testing. This may include: Blood labs. A transesophageal echocardiogram (TEE). What happens during the procedure?     An IV will be inserted into one of your veins. You will be given a sedative. This helps you relax. Electrodes or metal paddles will be placed on your chest. They may be placed in one of these ways: One placed on your right chest, the other on the left ribs. One placed on your chest and the other on your back. An electrical shock will be delivered. The shock briefly stops (resets) your heart rhythm. Your provider will check to see if your heart rhythm is now normal. Some people need only one shock. Some need more to restore a normal heart rhythm. The procedure may vary among providers and hospitals. What happens after the procedure? Your blood pressure, heart rate, breathing rate, and  blood oxygen level will be monitored until you leave the hospital or clinic. Your heart rhythm will be watched to make sure it does not change. This information is not intended to replace advice given to you by your health care provider. Make sure you discuss any questions you have with your health care provider. Document Revised: 05/02/2022 Document Reviewed: 05/02/2022 Elsevier Patient Education  2024 Elsevier Inc.General Anesthesia, Adult, Care After The following information offers guidance on how to care for yourself after your procedure. Your health care provider may also give you more specific instructions. If you have problems or questions, contact your health care provider. What can I expect after the procedure? After the procedure, it is common for people to: Have pain or discomfort at the IV site. Have nausea or vomiting. Have a sore throat or hoarseness. Have trouble concentrating. Feel cold or chills. Feel weak, sleepy, or tired (fatigue). Have soreness and body aches. These can affect parts of the body that were not involved in  surgery. Follow these instructions at home: For the time period you were told by your health care provider:  Rest. Do not participate in activities where you could fall or become injured. Do not drive or use machinery. Do not drink alcohol. Do not take sleeping pills or medicines that cause drowsiness. Do not make important decisions or sign legal documents. Do not take care of children on your own. General instructions Drink enough fluid to keep your urine pale yellow. If you have sleep apnea, surgery and certain medicines can increase your risk for breathing problems. Follow instructions from your health care provider about wearing your sleep device: Anytime you are sleeping, including during daytime naps. While taking prescription pain medicines, sleeping medicines, or medicines that make you drowsy. Return to your normal activities as told by  your health care provider. Ask your health care provider what activities are safe for you. Take over-the-counter and prescription medicines only as told by your health care provider. Do not use any products that contain nicotine or tobacco. These products include cigarettes, chewing tobacco, and vaping devices, such as e-cigarettes. These can delay incision healing after surgery. If you need help quitting, ask your health care provider. Contact a health care provider if: You have nausea or vomiting that does not get better with medicine. You vomit every time you eat or drink. You have pain that does not get better with medicine. You cannot urinate or have bloody urine. You develop a skin rash. You have a fever. Get help right away if: You have trouble breathing. You have chest pain. You vomit blood. These symptoms may be an emergency. Get help right away. Call 911. Do not wait to see if the symptoms will go away. Do not drive yourself to the hospital. Summary After the procedure, it is common to have a sore throat, hoarseness, nausea, vomiting, or to feel weak, sleepy, or fatigue. For the time period you were told by your health care provider, do not drive or use machinery. Get help right away if you have difficulty breathing, have chest pain, or vomit blood. These symptoms may be an emergency. This information is not intended to replace advice given to you by your health care provider. Make sure you discuss any questions you have with your health care provider. Document Revised: 12/07/2021 Document Reviewed: 12/07/2021 Elsevier Patient Education  2024 [employer].

## 2024-01-30 NOTE — Telephone Encounter (Signed)
 Checking percert on the following patient for testing scheduled at [facility].   DCCV - 5/14 at 10:15 - Dr. Amanda Jungling - [facility]

## 2024-01-30 NOTE — Telephone Encounter (Signed)
 Spoke to pt's Wife and scheduled his Aflutter Ablation with Dr. Arlester Ladd on 7/15 at 12:30 PM. Pt's Wife ask if we call to speak with her due to the pt having mini strokes he doesn't remember much and is forgetful.   She will take him to have updated labs done at [facility] near them on 7/1.   I will mail instruction letter to home address once completed.

## 2024-02-02 ENCOUNTER — Ambulatory Visit (HOSPITAL_COMMUNITY)
Admission: RE | Admit: 2024-02-02 | Discharge: 2024-02-02 | Disposition: A | Discharge status: Home / Self Care | Source: Ambulatory Visit | Attending: Student | Admitting: Student

## 2024-02-02 ENCOUNTER — Encounter (HOSPITAL_COMMUNITY)
Admission: RE | Admit: 2024-02-02 | Discharge: 2024-02-02 | Disposition: A | Discharge status: Home / Self Care | Source: Ambulatory Visit | Attending: Cardiology | Admitting: Cardiology

## 2024-02-02 VITALS — BP 113/73 | HR 76 | Resp 18 | Ht 68.0 in | Wt 244.9 lb

## 2024-02-02 DIAGNOSIS — I5022 Chronic systolic (congestive) heart failure: Secondary | ICD-10-CM | POA: Insufficient documentation

## 2024-02-02 DIAGNOSIS — I5023 Acute on chronic systolic (congestive) heart failure: Secondary | ICD-10-CM

## 2024-02-02 DIAGNOSIS — Z01818 Encounter for other preprocedural examination: Secondary | ICD-10-CM

## 2024-02-02 DIAGNOSIS — E1165 Type 2 diabetes mellitus with hyperglycemia: Secondary | ICD-10-CM | POA: Diagnosis present

## 2024-02-02 DIAGNOSIS — Z79899 Other long term (current) drug therapy: Secondary | ICD-10-CM | POA: Insufficient documentation

## 2024-02-02 LAB — CBC WITH DIFFERENTIAL/PLATELET
Abs Immature Granulocytes: 0.04 10*3/uL (ref 0.00–0.07)
Basophils Absolute: 0.1 10*3/uL (ref 0.0–0.1)
Basophils Relative: 1 %
Eosinophils Absolute: 0.1 10*3/uL (ref 0.0–0.5)
Eosinophils Relative: 1 %
HCT: 45.5 % (ref 39.0–52.0)
Hemoglobin: 14.7 g/dL (ref 13.0–17.0)
Immature Granulocytes: 0 %
Lymphocytes Relative: 16 %
Lymphs Abs: 1.7 10*3/uL (ref 0.7–4.0)
MCH: 29.9 pg (ref 26.0–34.0)
MCHC: 32.3 g/dL (ref 30.0–36.0)
MCV: 92.7 fL (ref 80.0–100.0)
Monocytes Absolute: 1.1 10*3/uL — ABNORMAL HIGH (ref 0.1–1.0)
Monocytes Relative: 10 %
Neutro Abs: 7.4 10*3/uL (ref 1.7–7.7)
Neutrophils Relative %: 72 %
Platelets: 206 10*3/uL (ref 150–400)
RBC: 4.91 MIL/uL (ref 4.22–5.81)
RDW: 13.5 % (ref 11.5–15.5)
WBC: 10.4 10*3/uL (ref 4.0–10.5)
nRBC: 0 % (ref 0.0–0.2)

## 2024-02-02 LAB — BASIC METABOLIC PANEL WITH GFR
Anion gap: 12 (ref 5–15)
BUN: 43 mg/dL — ABNORMAL HIGH (ref 8–23)
CO2: 21 mmol/L — ABNORMAL LOW (ref 22–32)
Calcium: 9.4 mg/dL (ref 8.9–10.3)
Chloride: 102 mmol/L (ref 98–111)
Creatinine, Ser: 1.32 mg/dL — ABNORMAL HIGH (ref 0.61–1.24)
GFR, Estimated: 57 mL/min — ABNORMAL LOW (ref >60)
Glucose, Bld: 119 mg/dL — ABNORMAL HIGH (ref 70–99)
Potassium: 4.7 mmol/L (ref 3.5–5.1)
Sodium: 135 mmol/L (ref 135–145)

## 2024-02-02 LAB — ECHOCARDIOGRAM COMPLETE
AR max vel: 2.91 cm2
AV Area VTI: 3.07 cm2
AV Area mean vel: 3.09 cm2
AV Mean grad: 1.8 mmHg
AV Peak grad: 3.6 mmHg
Ao pk vel: 0.95 m/s
Area-P 1/2: 4.68 cm2
Height: 68 in
S' Lateral: 3.9 cm
Weight: 3918.9 [oz_av]

## 2024-02-02 NOTE — Progress Notes (Signed)
*  PRELIMINARY RESULTS* Echocardiogram 2D Echocardiogram has been performed.  Bernis Brisker 02/02/2024, 3:33 PM

## 2024-02-03 ENCOUNTER — Telehealth: Payer: Self-pay

## 2024-02-03 ENCOUNTER — Ambulatory Visit: Payer: Self-pay

## 2024-02-03 ENCOUNTER — Ambulatory Visit: Payer: Self-pay | Admitting: Student

## 2024-02-03 NOTE — Telephone Encounter (Signed)
 Left a message for patient to call office back regarding r/s Cardioversion.

## 2024-02-03 NOTE — Progress Notes (Signed)
 Left voice mail about surgery canceled for tomorrow.

## 2024-02-03 NOTE — Telephone Encounter (Signed)
 Per DPR- Can leave detailed message on patient's phone.  Left a message that due to the city wide water  outage, his cardioversion at [facility] has been cancelled for 5/14 and to call office back to reschedule as soon as possible.

## 2024-02-03 NOTE — Telephone Encounter (Signed)
 Spoke to wife who verbalized understanding of cancellation of patients DCCV for 02/04/24. Attempted to call scheduler to get DCCV rescheduled- will call again.

## 2024-02-03 NOTE — Telephone Encounter (Signed)
 Received message from provider stating that d/t lack of water  at [facility], pt's DCCV has been cancelled for 5/14 and needs to be r/s.   Attempted to contact patient w/ no answer. Left a voicemail for patient to call office back.

## 2024-02-04 NOTE — Telephone Encounter (Signed)
 Rescheduled DCCV for 5/20 at [facility] with Dr. Avanell Bob- 9:40 am.  Left a message for pt/wife to call office back.

## 2024-02-04 NOTE — Telephone Encounter (Signed)
 Left a message for pt/wife to call office back.

## 2024-02-05 ENCOUNTER — Encounter (HOSPITAL_COMMUNITY)
Admission: RE | Admit: 2024-02-05 | Discharge: 2024-02-05 | Disposition: A | Discharge status: Home / Self Care | Source: Ambulatory Visit | Attending: Internal Medicine | Admitting: Internal Medicine

## 2024-02-05 NOTE — Telephone Encounter (Signed)
 Spoke to wife who voiced understanding regarding rescheduled DCCV. Wife notified that short stay/pre op would be giving them a call today per Orelia Binet to discuss any questions or concerns pt/pt's wife may have regarding procedure. Pt's wife had no concerns at this time.    DCCV: Tuesday 5/20 @ 9:40 am with Dr. Avanell Bob at [facility].

## 2024-02-05 NOTE — Pre-Procedure Instructions (Signed)
 Attempted pre-op phone call. Left VM for him to call us back.

## 2024-02-06 ENCOUNTER — Ambulatory Visit: Payer: Self-pay | Admitting: Cardiovascular Disease

## 2024-02-06 ENCOUNTER — Ambulatory Visit (INDEPENDENT_AMBULATORY_CARE_PROVIDER_SITE_OTHER): Payer: Medicare Other

## 2024-02-06 DIAGNOSIS — I447 Left bundle-branch block, unspecified: Secondary | ICD-10-CM | POA: Diagnosis not present

## 2024-02-06 LAB — CUP PACEART REMOTE DEVICE CHECK
Battery Remaining Longevity: 80 mo
Battery Remaining Percentage: 91 %
Battery Voltage: 2.99 V
Brady Statistic AP VP Percent: 15 %
Brady Statistic AP VS Percent: 1 %
Brady Statistic AS VP Percent: 85 %
Brady Statistic AS VS Percent: 1 %
Brady Statistic RA Percent Paced: 2.2 %
Date Time Interrogation Session: 20250516031636
HighPow Impedance: 75 Ohm
Implantable Lead Connection Status: 753985
Implantable Lead Connection Status: 753985
Implantable Lead Connection Status: 753985
Implantable Lead Implant Date: 20241114
Implantable Lead Implant Date: 20241114
Implantable Lead Implant Date: 20241114
Implantable Lead Location: 753858
Implantable Lead Location: 753859
Implantable Lead Location: 753860
Implantable Pulse Generator Implant Date: 20241114
Lead Channel Impedance Value: 360 Ohm
Lead Channel Impedance Value: 440 Ohm
Lead Channel Impedance Value: 750 Ohm
Lead Channel Pacing Threshold Amplitude: 1 V
Lead Channel Pacing Threshold Amplitude: 1 V
Lead Channel Pacing Threshold Amplitude: 2.25 V
Lead Channel Pacing Threshold Pulse Width: 0.5 ms
Lead Channel Pacing Threshold Pulse Width: 0.5 ms
Lead Channel Pacing Threshold Pulse Width: 0.5 ms
Lead Channel Sensing Intrinsic Amplitude: 11.5 mV
Lead Channel Sensing Intrinsic Amplitude: 4.9 mV
Lead Channel Setting Pacing Amplitude: 2 V
Lead Channel Setting Pacing Amplitude: 2 V
Lead Channel Setting Pacing Amplitude: 3.75 V
Lead Channel Setting Pacing Pulse Width: 0.5 ms
Lead Channel Setting Pacing Pulse Width: 0.5 ms
Lead Channel Setting Sensing Sensitivity: 0.5 mV
Pulse Gen Serial Number: 211030161
Zone Setting Status: 755011

## 2024-02-09 ENCOUNTER — Ambulatory Visit: Admitting: Cardiology

## 2024-02-09 NOTE — Pre-Procedure Instructions (Signed)
 Attempted pre-op phone call. Left VM for him to call us back.

## 2024-02-10 ENCOUNTER — Encounter (HOSPITAL_COMMUNITY)
Admission: RE | Disposition: A | Discharge status: Home / Self Care | Payer: Self-pay | Source: Home / Self Care | Attending: Internal Medicine

## 2024-02-10 ENCOUNTER — Other Ambulatory Visit: Payer: Self-pay

## 2024-02-10 ENCOUNTER — Ambulatory Visit (HOSPITAL_BASED_OUTPATIENT_CLINIC_OR_DEPARTMENT_OTHER)

## 2024-02-10 ENCOUNTER — Ambulatory Visit (HOSPITAL_COMMUNITY)

## 2024-02-10 ENCOUNTER — Encounter (HOSPITAL_COMMUNITY): Payer: Self-pay | Admitting: Internal Medicine

## 2024-02-10 ENCOUNTER — Ambulatory Visit: Payer: Self-pay | Admitting: *Deleted

## 2024-02-10 ENCOUNTER — Ambulatory Visit (HOSPITAL_COMMUNITY)
Admission: RE | Admit: 2024-02-10 | Discharge: 2024-02-10 | Disposition: A | Discharge status: Home / Self Care | Attending: Internal Medicine | Admitting: Internal Medicine

## 2024-02-10 DIAGNOSIS — Z86718 Personal history of other venous thrombosis and embolism: Secondary | ICD-10-CM | POA: Diagnosis not present

## 2024-02-10 DIAGNOSIS — I4892 Unspecified atrial flutter: Secondary | ICD-10-CM | POA: Diagnosis present

## 2024-02-10 DIAGNOSIS — I428 Other cardiomyopathies: Secondary | ICD-10-CM | POA: Insufficient documentation

## 2024-02-10 DIAGNOSIS — I5022 Chronic systolic (congestive) heart failure: Secondary | ICD-10-CM | POA: Diagnosis not present

## 2024-02-10 DIAGNOSIS — E119 Type 2 diabetes mellitus without complications: Secondary | ICD-10-CM | POA: Insufficient documentation

## 2024-02-10 DIAGNOSIS — I110 Hypertensive heart disease with heart failure: Secondary | ICD-10-CM | POA: Insufficient documentation

## 2024-02-10 DIAGNOSIS — Z95810 Presence of automatic (implantable) cardiac defibrillator: Secondary | ICD-10-CM | POA: Diagnosis not present

## 2024-02-10 DIAGNOSIS — F32A Depression, unspecified: Secondary | ICD-10-CM | POA: Diagnosis not present

## 2024-02-10 DIAGNOSIS — Z7901 Long term (current) use of anticoagulants: Secondary | ICD-10-CM | POA: Insufficient documentation

## 2024-02-10 DIAGNOSIS — I2510 Atherosclerotic heart disease of native coronary artery without angina pectoris: Secondary | ICD-10-CM | POA: Diagnosis not present

## 2024-02-10 DIAGNOSIS — G4733 Obstructive sleep apnea (adult) (pediatric): Secondary | ICD-10-CM | POA: Insufficient documentation

## 2024-02-10 DIAGNOSIS — I5023 Acute on chronic systolic (congestive) heart failure: Secondary | ICD-10-CM

## 2024-02-10 DIAGNOSIS — I447 Left bundle-branch block, unspecified: Secondary | ICD-10-CM | POA: Diagnosis not present

## 2024-02-10 HISTORY — PX: CARDIOVERSION: SHX1299

## 2024-02-10 LAB — BASIC METABOLIC PANEL WITH GFR
BUN/Creatinine Ratio: 24 (ref 10–24)
BUN: 35 mg/dL — ABNORMAL HIGH (ref 8–27)
CO2: 23 mmol/L (ref 20–29)
Calcium: 9.5 mg/dL (ref 8.6–10.2)
Chloride: 100 mmol/L (ref 96–106)
Creatinine, Ser: 1.46 mg/dL — ABNORMAL HIGH (ref 0.76–1.27)
Glucose: 157 mg/dL — ABNORMAL HIGH (ref 70–99)
Potassium: 4.8 mmol/L (ref 3.5–5.2)
Sodium: 142 mmol/L (ref 134–144)
eGFR: 50 mL/min/{1.73_m2} — ABNORMAL LOW (ref >59)

## 2024-02-10 LAB — GLUCOSE, CAPILLARY: Glucose-Capillary: 172 mg/dL — ABNORMAL HIGH (ref 70–99)

## 2024-02-10 LAB — CBC
Hematocrit: 45.9 % (ref 37.5–51.0)
Hemoglobin: 14.6 g/dL (ref 13.0–17.7)
MCH: 29.8 pg (ref 26.6–33.0)
MCHC: 31.8 g/dL (ref 31.5–35.7)
MCV: 94 fL (ref 79–97)
Platelets: 224 10*3/uL (ref 150–450)
RBC: 4.9 x10E6/uL (ref 4.14–5.80)
RDW: 13.5 % (ref 11.6–15.4)
WBC: 9.5 10*3/uL (ref 3.4–10.8)

## 2024-02-10 SURGERY — CARDIOVERSION
Anesthesia: General

## 2024-02-10 MED ORDER — PROPOFOL 10 MG/ML IV BOLUS
INTRAVENOUS | Status: DC | PRN
  Administered 2024-02-10: 60 mg via INTRAVENOUS

## 2024-02-10 MED ORDER — SODIUM CHLORIDE 0.9 % IV SOLN: INTRAVENOUS | Status: DC | PRN

## 2024-02-10 MED ORDER — SODIUM CHLORIDE 0.9 % IV SOLN: INTRAVENOUS | Status: DC

## 2024-02-10 NOTE — Transfer of Care (Signed)
 Immediate Anesthesia Transfer of Care Note  Patient: Noah Hampton  Procedure(s) Performed: CARDIOVERSION  Patient Location: PACU  Anesthesia Type:General  Level of Consciousness: awake  Airway & Oxygen Therapy: Patient Spontanous Breathing and Patient connected to nasal cannula oxygen  Post-op Assessment: Report given to RN and Post -op Vital signs reviewed and stable  Post vital signs: Reviewed and stable  Last Vitals:  Vitals Value Taken Time  BP 124/65 02/10/24 0933  Temp    Pulse 85 02/10/24 0934  Resp 21 02/10/24 0934  SpO2 99 % 02/10/24 0934  Vitals shown include unfiled device data.  Last Pain:  Vitals:   02/10/24 0850  TempSrc: Oral  PainSc: 0-No pain         Complications: No notable events documented.

## 2024-02-10 NOTE — Interval H&P Note (Signed)
 History and Physical Interval Note:  02/10/2024 9:15 AM  Noah Hampton  has presented today for surgery, with the diagnosis of a-flutter.  The various methods of treatment have been discussed with the patient and family. After consideration of risks, benefits and other options for treatment, the patient has consented to  Procedure(s): CARDIOVERSION (N/A) as a surgical intervention.  The patient's history has been reviewed, patient examined, no change in status, stable for surgery.  I have reviewed the patient's chart and labs.  Questions were answered to the patient's satisfaction.     [name]

## 2024-02-10 NOTE — CV Procedure (Signed)
 Electrical Cardioversion Procedure Note Noah Hampton [id number] 1951/04/15  Procedure: Electrical Cardioversion Indications:  Atrial Flutter  Procedure Details Consent: Risks of procedure as well as the alternatives and risks of each were explained to the (patient/caregiver).  Consent for procedure obtained. Time Out: Verified patient identification, verified procedure, site/side was marked, verified correct patient position, special equipment/implants available, medications/allergies/relevent history reviewed, required imaging and test results available.  Performed  Patient placed on cardiac monitor, pulse oximetry, supplemental oxygen as necessary.  Sedation given: Propofol  and lidocaine   Pacer pads placed anterior and posterior chest.  Cardioverted 1 time(s).  Cardioverted at 200J.  Evaluation Findings: Post procedure EKG shows: NSR Complications: None Patient did tolerate procedure well.   Noah Hampton 02/10/2024, 9:29 AM

## 2024-02-10 NOTE — Anesthesia Preprocedure Evaluation (Addendum)
 Anesthesia Evaluation  Patient identified by MRN, date of birth, ID band Patient awake    Reviewed: Allergy & Precautions, H&P , NPO status , Patient's Chart, lab work & pertinent test results  Airway Mallampati: II  TM Distance: >3 FB Neck ROM: Full    Dental no notable dental hx.    Pulmonary sleep apnea , pneumonia, COPD   Pulmonary exam normal breath sounds clear to auscultation       Cardiovascular hypertension, + CAD, + Peripheral Vascular Disease and +CHF  Normal cardiovascular exam+ pacemaker  Rhythm:Irregular Rate:Abnormal  EF 40-45%   Neuro/Psych  PSYCHIATRIC DISORDERS Anxiety Depression    negative neurological ROS     GI/Hepatic Neg liver ROS,GERD  ,,  Endo/Other  diabetes    Renal/GU negative Renal ROS  negative genitourinary   Musculoskeletal  (+) Arthritis ,    Abdominal   Peds negative pediatric ROS (+)  Hematology negative hematology ROS (+)   Anesthesia Other Findings   Reproductive/Obstetrics negative OB ROS                             Anesthesia Physical Anesthesia Plan  ASA: 4  Anesthesia Plan: General   Post-op Pain Management:    Induction: Intravenous  PONV Risk Score and Plan:   Airway Management Planned: Nasal Cannula  Additional Equipment:   Intra-op Plan:   Post-operative Plan:   Informed Consent: I have reviewed the patients History and Physical, chart, labs and discussed the procedure including the risks, benefits and alternatives for the proposed anesthesia with the patient or authorized representative who has indicated his/her understanding and acceptance.     Dental advisory given  Plan Discussed with: CRNA  Anesthesia Plan Comments:        Anesthesia Quick Evaluation

## 2024-02-10 NOTE — Anesthesia Postprocedure Evaluation (Signed)
 Anesthesia Post Note  Patient: Noah Hampton  Procedure(s) Performed: CARDIOVERSION  Patient location during evaluation: PACU Anesthesia Type: General Level of consciousness: awake and alert Pain management: pain level controlled Vital Signs Assessment: post-procedure vital signs reviewed and stable Respiratory status: spontaneous breathing, nonlabored ventilation, respiratory function stable and patient connected to nasal cannula oxygen Cardiovascular status: blood pressure returned to baseline and stable Postop Assessment: no apparent nausea or vomiting Anesthetic complications: no  No notable events documented.   Last Vitals:  Vitals:   02/10/24 0915 02/10/24 0933  BP: (!) 140/113 124/65  Pulse: 70 85  Resp: 19 (!) 23  Temp:  36.7 C  SpO2: 98% 97%    Last Pain:  Vitals:   02/10/24 0933  TempSrc:   PainSc: Asleep                 Beacher Limerick

## 2024-02-11 ENCOUNTER — Encounter (HOSPITAL_COMMUNITY): Payer: Self-pay | Admitting: Internal Medicine

## 2024-03-15 NOTE — Progress Notes (Signed)
 Remote ICD transmission.

## 2024-03-23 ENCOUNTER — Telehealth: Payer: Self-pay | Admitting: Cardiovascular Disease

## 2024-03-23 ENCOUNTER — Other Ambulatory Visit: Payer: Self-pay

## 2024-03-23 DIAGNOSIS — E785 Hyperlipidemia, unspecified: Secondary | ICD-10-CM

## 2024-03-23 DIAGNOSIS — Z79899 Other long term (current) drug therapy: Secondary | ICD-10-CM

## 2024-03-23 NOTE — Telephone Encounter (Signed)
 [facility] from [address] in [place], [place] called to get lab orders placed or released so that the patient could get them done prior to his procedure with Dr. Nancey on 07/15. Phone call was disconnected before I could get in touch with anyone

## 2024-03-23 NOTE — Telephone Encounter (Signed)
 Looks like patient already had preop labs done. There was one order in for [facility] that was not released.  Released and faxed to [place] [facility] [phone]

## 2024-03-24 LAB — BASIC METABOLIC PANEL WITH GFR
BUN/Creatinine Ratio: 32 — ABNORMAL HIGH (ref 10–24)
BUN: 38 mg/dL — ABNORMAL HIGH (ref 8–27)
CO2: 24 mmol/L (ref 20–29)
Calcium: 9.8 mg/dL (ref 8.6–10.2)
Chloride: 99 mmol/L (ref 96–106)
Creatinine, Ser: 1.2 mg/dL (ref 0.76–1.27)
Glucose: 151 mg/dL — ABNORMAL HIGH (ref 70–99)
Potassium: 5.3 mmol/L — ABNORMAL HIGH (ref 3.5–5.2)
Sodium: 137 mmol/L (ref 134–144)
eGFR: 64 mL/min/{1.73_m2} (ref >59)

## 2024-03-29 ENCOUNTER — Ambulatory Visit: Payer: Self-pay | Admitting: Internal Medicine

## 2024-03-29 DIAGNOSIS — I5022 Chronic systolic (congestive) heart failure: Secondary | ICD-10-CM

## 2024-03-29 DIAGNOSIS — I5020 Unspecified systolic (congestive) heart failure: Secondary | ICD-10-CM

## 2024-03-30 ENCOUNTER — Telehealth (HOSPITAL_COMMUNITY): Payer: Self-pay

## 2024-03-30 DIAGNOSIS — I5022 Chronic systolic (congestive) heart failure: Secondary | ICD-10-CM

## 2024-03-30 DIAGNOSIS — I4819 Other persistent atrial fibrillation: Secondary | ICD-10-CM

## 2024-03-30 DIAGNOSIS — Z01812 Encounter for preprocedural laboratory examination: Secondary | ICD-10-CM

## 2024-03-30 NOTE — Telephone Encounter (Signed)
 Attempted to reach patient to discuss upcoming procedure, no answer. Left detailed VM for patient to return call and required labs to be completed at [facility].  Pre-op labs: released CBC and BMP orders showing in chart for Dr. Nancey.

## 2024-03-31 NOTE — Telephone Encounter (Signed)
 The patient has been notified of the result and verbalized understanding.  All questions (if any) were answered. Advised wife will place order in the mail and can have repeated once he has his procedure.  Littie CHRISTELLA Croak, CMA 03/31/2024 9:17 AM

## 2024-03-31 NOTE — Telephone Encounter (Signed)
-----   Message from Vishnu P Mallipeddi sent at 03/29/2024  4:31 PM EDT ----- K 5.3.  Repeat BMP. ----- Message ----- From: Interface, [facility] Lab Results In Sent: 03/24/2024   5:38 AM EDT To: Vishnu P Mallipeddi, MD

## 2024-04-05 ENCOUNTER — Telehealth: Payer: Self-pay | Admitting: Internal Medicine

## 2024-04-05 NOTE — Pre-Procedure Instructions (Signed)
 Instructed patient on the following items: Arrival time 1000 Nothing to eat or drink after midnight No meds AM of procedure Responsible person to drive you home and stay with you for 24 hrs  Have you missed any doses of anti-coagulant Eliquis- takes twice a day, hasn't missed any doses.  Don't take dose morning of procedure.

## 2024-04-05 NOTE — Telephone Encounter (Signed)
 Left message to return call All forms may be dropped at either office, fee to complete forms may appy.

## 2024-04-05 NOTE — Telephone Encounter (Signed)
 Pt wants to have a DMV Form filled out and wants to know if he should just bring the form by or what? Please advise.

## 2024-04-06 ENCOUNTER — Ambulatory Visit (HOSPITAL_COMMUNITY): Admitting: Anesthesiology

## 2024-04-06 ENCOUNTER — Ambulatory Visit (HOSPITAL_BASED_OUTPATIENT_CLINIC_OR_DEPARTMENT_OTHER): Admitting: Anesthesiology

## 2024-04-06 ENCOUNTER — Ambulatory Visit (HOSPITAL_COMMUNITY)
Admission: RE | Disposition: A | Discharge status: Home / Self Care | Payer: Self-pay | Source: Home / Self Care | Attending: Cardiovascular Disease

## 2024-04-06 ENCOUNTER — Other Ambulatory Visit: Payer: Self-pay

## 2024-04-06 ENCOUNTER — Ambulatory Visit (HOSPITAL_COMMUNITY)
Admission: RE | Admit: 2024-04-06 | Discharge: 2024-04-06 | Disposition: A | Discharge status: Home / Self Care | Attending: Cardiovascular Disease | Admitting: Cardiovascular Disease

## 2024-04-06 DIAGNOSIS — I5022 Chronic systolic (congestive) heart failure: Secondary | ICD-10-CM | POA: Diagnosis not present

## 2024-04-06 DIAGNOSIS — Z86718 Personal history of other venous thrombosis and embolism: Secondary | ICD-10-CM | POA: Insufficient documentation

## 2024-04-06 DIAGNOSIS — I110 Hypertensive heart disease with heart failure: Secondary | ICD-10-CM | POA: Insufficient documentation

## 2024-04-06 DIAGNOSIS — G4733 Obstructive sleep apnea (adult) (pediatric): Secondary | ICD-10-CM | POA: Diagnosis not present

## 2024-04-06 DIAGNOSIS — J449 Chronic obstructive pulmonary disease, unspecified: Secondary | ICD-10-CM | POA: Insufficient documentation

## 2024-04-06 DIAGNOSIS — I2510 Atherosclerotic heart disease of native coronary artery without angina pectoris: Secondary | ICD-10-CM | POA: Insufficient documentation

## 2024-04-06 DIAGNOSIS — Z95810 Presence of automatic (implantable) cardiac defibrillator: Secondary | ICD-10-CM | POA: Diagnosis not present

## 2024-04-06 DIAGNOSIS — E1151 Type 2 diabetes mellitus with diabetic peripheral angiopathy without gangrene: Secondary | ICD-10-CM | POA: Insufficient documentation

## 2024-04-06 DIAGNOSIS — I428 Other cardiomyopathies: Secondary | ICD-10-CM | POA: Diagnosis not present

## 2024-04-06 DIAGNOSIS — I4892 Unspecified atrial flutter: Secondary | ICD-10-CM

## 2024-04-06 DIAGNOSIS — I5023 Acute on chronic systolic (congestive) heart failure: Secondary | ICD-10-CM | POA: Diagnosis not present

## 2024-04-06 DIAGNOSIS — Z7901 Long term (current) use of anticoagulants: Secondary | ICD-10-CM | POA: Diagnosis not present

## 2024-04-06 DIAGNOSIS — I447 Left bundle-branch block, unspecified: Secondary | ICD-10-CM | POA: Diagnosis not present

## 2024-04-06 DIAGNOSIS — Z79899 Other long term (current) drug therapy: Secondary | ICD-10-CM | POA: Insufficient documentation

## 2024-04-06 HISTORY — PX: A-FLUTTER ABLATION: EP1230

## 2024-04-06 LAB — CBC
HCT: 41.6 % (ref 39.0–52.0)
Hemoglobin: 13.7 g/dL (ref 13.0–17.0)
MCH: 30.3 pg (ref 26.0–34.0)
MCHC: 32.9 g/dL (ref 30.0–36.0)
MCV: 92 fL (ref 80.0–100.0)
Platelets: 172 K/uL (ref 150–400)
RBC: 4.52 MIL/uL (ref 4.22–5.81)
RDW: 13.6 % (ref 11.5–15.5)
WBC: 8 K/uL (ref 4.0–10.5)
nRBC: 0 % (ref 0.0–0.2)

## 2024-04-06 LAB — BASIC METABOLIC PANEL WITH GFR
Anion gap: 13 (ref 5–15)
BUN: 25 mg/dL — ABNORMAL HIGH (ref 8–23)
CO2: 22 mmol/L (ref 22–32)
Calcium: 9.9 mg/dL (ref 8.9–10.3)
Chloride: 104 mmol/L (ref 98–111)
Creatinine, Ser: 1.19 mg/dL (ref 0.61–1.24)
GFR, Estimated: >60 mL/min (ref >60)
Glucose, Bld: 164 mg/dL — ABNORMAL HIGH (ref 70–99)
Potassium: 4.2 mmol/L (ref 3.5–5.1)
Sodium: 139 mmol/L (ref 135–145)

## 2024-04-06 LAB — GLUCOSE, CAPILLARY
Glucose-Capillary: 185 mg/dL — ABNORMAL HIGH (ref 70–99)
Glucose-Capillary: 191 mg/dL — ABNORMAL HIGH (ref 70–99)

## 2024-04-06 SURGERY — A-FLUTTER ABLATION
Anesthesia: General

## 2024-04-06 MED ORDER — SODIUM CHLORIDE 0.9% FLUSH: 3.0000 mL | INTRAVENOUS | Status: DC | PRN

## 2024-04-06 MED ORDER — SODIUM CHLORIDE 0.9 % IV SOLN
Freq: Once | INTRAVENOUS | Status: DC
  Filled 2024-04-06: qty 10

## 2024-04-06 MED ORDER — ONDANSETRON HCL 4 MG/2ML IJ SOLN: 4.0000 mg | Freq: Four times a day (QID) | INTRAMUSCULAR | Status: DC | PRN

## 2024-04-06 MED ORDER — HEPARIN (PORCINE) IN NACL 1000-0.9 UT/500ML-% IV SOLN
INTRAVENOUS | Status: DC | PRN
Start: 2024-04-06 — End: 2024-04-06
  Administered 2024-04-06: 500 mL

## 2024-04-06 MED ORDER — FENTANYL CITRATE (PF) 100 MCG/2ML IJ SOLN
INTRAMUSCULAR | Status: AC
  Filled 2024-04-06: qty 2

## 2024-04-06 MED ORDER — ACETAMINOPHEN 325 MG PO TABS: 650.0000 mg | ORAL_TABLET | ORAL | Status: DC | PRN

## 2024-04-06 MED ORDER — ROCURONIUM BROMIDE 10 MG/ML (PF) SYRINGE
PREFILLED_SYRINGE | INTRAVENOUS | Status: DC | PRN
  Administered 2024-04-06: 10 mg via INTRAVENOUS
  Administered 2024-04-06: 70 mg via INTRAVENOUS

## 2024-04-06 MED ORDER — CEFAZOLIN SODIUM-DEXTROSE 2-3 GM-%(50ML) IV SOLR
INTRAVENOUS | Status: DC | PRN
  Administered 2024-04-06: 2 g via INTRAVENOUS

## 2024-04-06 MED ORDER — FENTANYL CITRATE (PF) 250 MCG/5ML IJ SOLN
INTRAMUSCULAR | Status: DC | PRN
  Administered 2024-04-06 (×2): 50 ug via INTRAVENOUS

## 2024-04-06 MED ORDER — PROPOFOL 10 MG/ML IV BOLUS
INTRAVENOUS | Status: DC | PRN
  Administered 2024-04-06: 160 mg via INTRAVENOUS

## 2024-04-06 MED ORDER — SODIUM CHLORIDE 0.9 % IV SOLN: 250.0000 mL | INTRAVENOUS | Status: DC | PRN

## 2024-04-06 MED ORDER — PHENYLEPHRINE HCL-NACL 20-0.9 MG/250ML-% IV SOLN
INTRAVENOUS | Status: DC | PRN
  Administered 2024-04-06: 30 ug/min via INTRAVENOUS

## 2024-04-06 MED ORDER — CEFAZOLIN SODIUM-DEXTROSE 2-4 GM/100ML-% IV SOLN
INTRAVENOUS | Status: DC
Start: 2024-04-06 — End: 2024-04-06
  Filled 2024-04-06: qty 100

## 2024-04-06 MED ORDER — SUGAMMADEX SODIUM 200 MG/2ML IV SOLN
INTRAVENOUS | Status: DC | PRN
  Administered 2024-04-06: 200 mg via INTRAVENOUS
  Administered 2024-04-06: 400 mg via INTRAVENOUS

## 2024-04-06 MED ORDER — BIVALIRUDIN BOLUS VIA INFUSION
0.1000 mg/kg | Freq: Once | INTRAVENOUS | Status: DC
  Filled 2024-04-06: qty 12

## 2024-04-06 MED ORDER — SODIUM CHLORIDE 0.9% FLUSH: 3.0000 mL | Freq: Two times a day (BID) | INTRAVENOUS | Status: DC

## 2024-04-06 MED ORDER — LIDOCAINE 2% (20 MG/ML) 5 ML SYRINGE
INTRAMUSCULAR | Status: DC | PRN
  Administered 2024-04-06: 100 mg via INTRAVENOUS

## 2024-04-06 MED ORDER — SODIUM CHLORIDE 0.9 % IV SOLN
INTRAVENOUS | Status: DC
Start: 2024-04-06 — End: 2024-04-06

## 2024-04-06 MED ORDER — ONDANSETRON HCL 4 MG/2ML IJ SOLN
INTRAMUSCULAR | Status: DC | PRN
  Administered 2024-04-06: 4 mg via INTRAVENOUS

## 2024-04-06 MED ORDER — DEXAMETHASONE SODIUM PHOSPHATE 10 MG/ML IJ SOLN
INTRAMUSCULAR | Status: DC | PRN
  Administered 2024-04-06: 5 mg via INTRAVENOUS

## 2024-04-06 MED ORDER — SODIUM CHLORIDE 0.9 % IV SOLN
1.7500 mg/kg/h | INTRAVENOUS | Status: DC
  Filled 2024-04-06: qty 250

## 2024-04-06 SURGICAL SUPPLY — 12 items
BAG SNAP BAND KOVER 36X36 (MISCELLANEOUS) IMPLANT
CATH EZ STEER NAV 8MM D-F CUR (ABLATOR) IMPLANT
CATH GE 8FR SOUNDSTAR (CATHETERS) IMPLANT
CATH WEBSTER BI DIR CS D-F CRV (CATHETERS) IMPLANT
DEVICE CLOSURE MYNXGRIP 6/7F (Vascular Products) IMPLANT
MAT PREVALON FULL STRYKER (MISCELLANEOUS) IMPLANT
PACK EP LF (CUSTOM PROCEDURE TRAY) ×1 IMPLANT
PAD DEFIB RADIO PHYSIO CONN (PAD) ×1 IMPLANT
PATCH CARTO3 (PAD) IMPLANT
SHEATH PINNACLE 8F 10CM (SHEATH) IMPLANT
SHEATH PINNACLE 9F 10CM (SHEATH) IMPLANT
SHEATH PROBE COVER 6X72 (BAG) IMPLANT

## 2024-04-06 NOTE — Progress Notes (Signed)
 Pt missed PM dose of Eliquis  yesterday, 04/05/24. Delon Decent, RN notified. Order for EKG to be done. NT3 informed to do EKG.

## 2024-04-06 NOTE — H&P (Signed)
 Electrophysiology Office Note:    Date:  04/06/2024   ID:  [name], DOB [birth year], MRN [id number]  PCP:  Alm Ozell POUR, DO   [facility] Providers Cardiologist:  Diannah SHAUNNA Maywood, MD Electrophysiologist:  Eulas FORBES Furbish, MD     Referring MD: No ref. provider found   History of Present Illness:    Noah Hampton is a 73 y.o. 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     Discussed the use of AI scribe software for clinical note transcription with the patient, who gave verbal consent to proceed.   He was diagnosed with acute systolic heart failure in February 2024 the patient's ejection fraction was initially 25-30% and has remained below 35% despite medical management. The patient reports persistent fatigue and frequent urination, which is distressing and disruptive to his daily life. The patient's spouse also notes that the patient has been out of work for a long time due to his health condition, which has led to feelings of depression. The patient expresses eagerness to improve his quality of life and reduce his symptoms.  He underwent placement of a BiV ICD on August 07, 2023.  His EF recovered to 45 to 50% quickly.  Unfortunately, he developed atrial flutter in late December 2025, and has remained persistently in flutter since.  Eliquis  was started.  He has had progressive symptoms of shortness of breath fatigue.     Today, he has fatigue, shortness of breath -- at baseline. He missed his dose of eliquis  last night but remains in sinus rhythm.  EKGs/Labs/Other Studies Reviewed Today:     Echocardiogram:  TTE November 11, 2022 EF 25 to 30%.  Grade 1 diastolic dysfunction.  Left atrium moderately dilated.  TTE May 27, 2023 EF 30 to 35%.  Grade 1 diastolic dysfunction.  Small pericardial effusion.  Normal biatrial size.  TTE September 17, 2023 EF 45 to 50%.   Cardiac  catherization  Angiogram 12/24/2022 Reviewed in Epic  EKG:    I reviewed the electrocardiogram from January 08, 2024 that shows atrial flutter    Physical Exam:    VS:  BP (!) 143/82   Pulse 62   Temp 97.8 F (36.6 C) (Oral)   Resp (!) 23   Ht 5' 8 (1.727 m)   Wt 113.4 kg   SpO2 97%   BMI 38.01 kg/m     Wt Readings from Last 3 Encounters:  04/06/24 113.4 kg  02/10/24 113.4 kg  02/02/24 111.1 kg     GEN: Well nourished, well developed in no acute distress CARDIAC: RRR, no murmurs, rubs, gallops RESPIRATORY:  Normal work of breathing MUSCULOSKELETAL: no edema    ASSESSMENT & PLAN:     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, EF 30-35% EF is persistently severely reduced despite optimal GDMT His symptoms are NYHA II-III He meets criteria for defibrillator and CRT Continue carvedilol  6.25 Farxiga  10 mg, Entresto  49-51, spironolactone  25  Atrial flutter Associated with fatigue, dyspnea Rate control, but limiting optimal BiV pacing We discussed management options, and using the shared decision making approach we will plan to proceed with ablation Will schedule DC cardioversion pending ablation  We discussed the indication, rationale, logistics, anticipated benefits, and potential risks of the ablation procedure including but not limited to -- bleed at the groin access site, chest pain, damage to nearby organs such as the diaphragm, lungs, or esophagus, need for a drainage tube,  or prolonged hospitalization. I explained that the risk for stroke, heart attack, need for open chest surgery, or even death is very low but not zero. he  expressed understanding and wishes to proceed.   History of alpha-gal appears to be spurious. He tells me that he eats hamburgers, pork chops, all kinds of red meat without any adverse consequence or symptoms whatsoever.  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  See Paceart for details He is not device dependent Suboptimal CRT  due to atrial flutter  History of proximal lower extremity DVT February 2024 Continue Eliquis  5 mg twice daily     Signed, Yuka Lallier E Terek Bee, MD  04/06/2024 11:18 AM    [facility]

## 2024-04-06 NOTE — Anesthesia Preprocedure Evaluation (Signed)
 Anesthesia Evaluation  Patient identified by MRN, date of birth, ID band Patient awake    Reviewed: Allergy & Precautions, NPO status , Patient's Chart, lab work & pertinent test results, reviewed documented beta blocker date and time   History of Anesthesia Complications Negative for: history of anesthetic complications  Airway Mallampati: II  TM Distance: >3 FB     Dental no notable dental hx.    Pulmonary sleep apnea and Continuous Positive Airway Pressure Ventilation , pneumonia, COPD,  COPD inhaler   breath sounds clear to auscultation       Cardiovascular hypertension, + CAD, + Peripheral Vascular Disease and +CHF  + pacemaker (AS VP 85%)  Rhythm:Regular Rate:Normal  IMPRESSIONS     1. Left ventricular ejection fraction, by estimation, is 40 to 45%. The  left ventricle has mildly decreased function. The left ventricle  demonstrates global hypokinesis. There is mild left ventricular  hypertrophy. Left ventricular diastolic parameters  are indeterminate.   2. Right ventricular systolic function is normal. The right ventricular  size is normal. Tricuspid regurgitation signal is inadequate for assessing  PA pressure.   3. The mitral valve is normal in structure. No evidence of mitral valve  regurgitation. No evidence of mitral stenosis.   4. The aortic valve is tricuspid. There is mild calcification of the  aortic valve. There is mild thickening of the aortic valve. Aortic valve  regurgitation is not visualized. No aortic stenosis is present.   5. The inferior vena cava is normal in size with greater than 50%  respiratory variability, suggesting right atrial pressure of 3 mmHg.      Neuro/Psych neg Seizures PSYCHIATRIC DISORDERS Anxiety Depression       GI/Hepatic ,GERD  ,,(+) neg Cirrhosis        Endo/Other  diabetes, Type 2    Renal/GU Renal disease     Musculoskeletal  (+) Arthritis ,    Abdominal    Peds  Hematology   Anesthesia Other Findings   Reproductive/Obstetrics                              Anesthesia Physical Anesthesia Plan  ASA: 3  Anesthesia Plan: General   Post-op Pain Management:    Induction: Intravenous  PONV Risk Score and Plan: 2 and Ondansetron  and Dexamethasone   Airway Management Planned: Oral ETT  Additional Equipment:   Intra-op Plan:   Post-operative Plan: Extubation in OR  Informed Consent: I have reviewed the patients History and Physical, chart, labs and discussed the procedure including the risks, benefits and alternatives for the proposed anesthesia with the patient or authorized representative who has indicated his/her understanding and acceptance.     Dental advisory given  Plan Discussed with: CRNA  Anesthesia Plan Comments:          Anesthesia Quick Evaluation

## 2024-04-06 NOTE — Progress Notes (Signed)
 Patient arrived to cath lab holding bay 20. Patient is awake but drowsy. Patient answers questions and follows commands appropriately. No neuro deficits noted. EKG and CBG obtained. Right groin dressing is dry/intact with no bleeding or hematoma noted. Post activity and precautions explained.  Will continue to monitor and reinforce precautions per orders and protocol.Noah Hampton E

## 2024-04-06 NOTE — Transfer of Care (Signed)
 Immediate Anesthesia Transfer of Care Note  Patient: Noah Hampton  Procedure(s) Performed: A-FLUTTER ABLATION  Patient Location: PACU and Cath Lab  Anesthesia Type:General  Level of Consciousness: drowsy, patient cooperative, and responds to stimulation  Airway & Oxygen Therapy: Patient Spontanous Breathing and Patient connected to face mask oxygen  Post-op Assessment: Report given to RN and Post -op Vital signs reviewed and stable  Post vital signs: Reviewed and stable  Last Vitals:  Vitals Value Taken Time  BP 114/66 04/06/24 14:15  Temp 36.6 C 04/06/24 13:38  Pulse 72 04/06/24 14:36  Resp 27 04/06/24 14:36  SpO2 88 % 04/06/24 14:36  Vitals shown include unfiled device data.  Last Pain:  Vitals:   04/06/24 1338  TempSrc: Oral         Complications: There were no known notable events for this encounter.

## 2024-04-06 NOTE — Discharge Instructions (Signed)

## 2024-04-06 NOTE — Anesthesia Procedure Notes (Signed)
 Procedure Name: Intubation Date/Time: 04/06/2024 12:09 PM  Performed by: Kinser Ee, CRNAPre-anesthesia Checklist: Patient identified, Patient being monitored, Timeout performed, Emergency Drugs available and Suction available Patient Re-evaluated:Patient Re-evaluated prior to induction Oxygen Delivery Method: Circle system utilized Preoxygenation: Pre-oxygenation with 100% oxygen Induction Type: IV induction Ventilation: Mask ventilation without difficulty Laryngoscope Size: Mac and 4 Grade View: Grade I Tube type: Oral Tube size: 7.5 mm Number of attempts: 1 Airway Equipment and Method: Stylet Placement Confirmation: ETT inserted through vocal cords under direct vision, positive ETCO2 and breath sounds checked- equal and bilateral Secured at: 23 cm Tube secured with: Tape Dental Injury: Teeth and Oropharynx as per pre-operative assessment

## 2024-04-07 ENCOUNTER — Telehealth (HOSPITAL_COMMUNITY): Payer: Self-pay

## 2024-04-07 ENCOUNTER — Encounter (HOSPITAL_COMMUNITY): Payer: Self-pay | Admitting: Cardiovascular Disease

## 2024-04-07 NOTE — Telephone Encounter (Signed)
 Spoke with patient to complete post procedure follow up call.  Patient reports no complications with groin sites.   Instructions reviewed with patient:  Remove large bandage at puncture site after 24 hours. It is normal to have bruising, tenderness, mild swelling, and a pea or marble sized lump/knot at the groin site which can take up to three months to resolve.  Get help right away if you notice sudden swelling at the puncture site.  Check your puncture site every day for signs of infection: fever, redness, swelling, pus drainage, warmth, foul odor or excessive pain. If this occurs, please call the office at [phone], to speak with the nurse. Get help right away if your puncture site is bleeding and the bleeding does not stop after applying firm pressure to the area.  You may continue to have skipped beats/ atrial flutter during the first several months after your procedure.  It is very important not to miss any doses of your blood thinner Eliquis .   You will follow up with the APP on 05/17/24.    Patient verbalized understanding to all instructions provided.

## 2024-04-07 NOTE — Anesthesia Postprocedure Evaluation (Signed)
 Anesthesia Post Note  Patient: Ralf Konopka  Procedure(s) Performed: A-FLUTTER ABLATION     Patient location during evaluation: PACU Anesthesia Type: General Level of consciousness: awake and alert Pain management: pain level controlled Vital Signs Assessment: post-procedure vital signs reviewed and stable Respiratory status: spontaneous breathing, nonlabored ventilation, respiratory function stable and patient connected to nasal cannula oxygen Cardiovascular status: blood pressure returned to baseline and stable Postop Assessment: no apparent nausea or vomiting Anesthetic complications: no   There were no known notable events for this encounter.  Last Vitals:  Vitals:   04/06/24 1443 04/06/24 1445  BP: 125/70 124/69  Pulse: 70 70  Resp: 20 (!) 24  Temp:    SpO2: 95% 93%    Last Pain:  Vitals:   04/06/24 1338  TempSrc: Oral                 Lynwood MARLA Cornea

## 2024-04-08 MED FILL — Fentanyl Citrate Preservative Free (PF) Inj 100 MCG/2ML: INTRAMUSCULAR | Qty: 2 | Status: AC

## 2024-04-12 ENCOUNTER — Telehealth: Payer: Self-pay | Admitting: Internal Medicine

## 2024-04-12 DIAGNOSIS — Z0279 Encounter for issue of other medical certificate: Secondary | ICD-10-CM

## 2024-04-12 NOTE — Telephone Encounter (Signed)
 Pt brought DMV forms into [place]. Dr.Mallipeddi is off all week will return on 04/19/2024 for hospital rounding. Interoffice to [place] with a note stating pt needs faxed by 7/31. Did inform pt we have 7-14 days to fill out the paperwork but I would inform them he needs them faxed and completed by 04/22/2024.  PT paid cash wants forms faxed to [phone]

## 2024-04-19 NOTE — Telephone Encounter (Signed)
 Cardiology portion of DMV form completed and entire form faxed to [phone] successfully.

## 2024-04-29 NOTE — Telephone Encounter (Signed)
 Will have forms faxed again to [phone]

## 2024-04-29 NOTE — Telephone Encounter (Signed)
 Pt called to state that the DMV never received fax, and they have revoked his driving privileges.

## 2024-04-30 NOTE — Telephone Encounter (Signed)
 Refax the forms on 04/29/2024  The fax went successfully

## 2024-05-04 ENCOUNTER — Ambulatory Visit: Attending: Student | Admitting: Student

## 2024-05-04 NOTE — Progress Notes (Deleted)
 Cardiology Office Note    Date:  05/04/2024  ID:  Noah Hampton, DOB 27-Oct-1950, MRN [id number] Cardiologist: Vishnu P Mallipeddi, MD Electrophysiologist:  Eulas FORBES Furbish, MD { :  History of Present Illness:    Noah Hampton is a 73 y.o. male with past medical history of chronic HFmrEF/NICM (EF 25-30% by echo in 10/2022, at 30-35% in 05/2023, underwent implantation of Abbott BiV ICD in 07/2023, EF at 45-50% by echo in 08/2023), CAD (cath in 12/2022 showing mild to moderate nonobstructive CAD), pericardial effusion, LBBB, HTN, HLD, IDDM, history of DVT and OSA who presents to the office today for 69-month follow-up.  He was examined by Dr. Furbish in 01/2024 and was noted to have been in persistent atrial flutter since 08/2023. He had recently been started on anticoagulation and reported compliance with this. Options were reviewed and it was recommended to proceed with DCCV and then further pursue an ablation. He underwent DCCV by Dr. Okey on 02/10/2024 with conversion to normal sinus rhythm. Ultimately underwent atrial flutter ablation on 04/06/2024 by Dr. Furbish with no immediate complications noted.  ROS: ***  Studies Reviewed:   EKG: EKG is*** ordered today and demonstrates ***   EKG Interpretation Date/Time:    Ventricular Rate:    PR Interval:    QRS Duration:    QT Interval:    QTC Calculation:   R Axis:      Text Interpretation:         Cardiac Catheterization: 12/2022   Prox RCA to Mid RCA lesion is 20% stenosed.   Ost Cx to Prox Cx lesion is 30% stenosed.   1st Mrg lesion is 30% stenosed.   Mid LAD lesion is 40% stenosed.   Mild to moderate non-obstructive plaque in the mid LAD (40% stenosis) Mild plaque in the proximal Circumflex and ostium of the first obtuse marginal branch (30% stenosis) Large dominant RCA with mild plaque in the mid vessel.  LVEDP=19 mmHg   Recommendations: He does not have obstructive CAD. Medical management of CAD. Medical management of  non-ischemic cardiomyopathy. Resume Eliquis  tomorrow morning if no bleeding from cath site in right wrist.   Echocardiogram: 01/2024 IMPRESSIONS     1. Left ventricular ejection fraction, by estimation, is 40 to 45%. The  left ventricle has mildly decreased function. The left ventricle  demonstrates global hypokinesis. There is mild left ventricular  hypertrophy. Left ventricular diastolic parameters  are indeterminate.   2. Right ventricular systolic function is normal. The right ventricular  size is normal. Tricuspid regurgitation signal is inadequate for assessing  PA pressure.   3. The mitral valve is normal in structure. No evidence of mitral valve  regurgitation. No evidence of mitral stenosis.   4. The aortic valve is tricuspid. There is mild calcification of the  aortic valve. There is mild thickening of the aortic valve. Aortic valve  regurgitation is not visualized. No aortic stenosis is present.   5. The inferior vena cava is normal in size with greater than 50%  respiratory variability, suggesting right atrial pressure of 3 mmHg.    Risk Assessment/Calculations:   {Does this patient have ATRIAL FIBRILLATION?:[phone]} No BP recorded.  {Refresh Note OR Click here to enter BP  :1}***         Physical Exam:   VS:  There were no vitals taken for this visit.   Wt Readings from Last 3 Encounters:  04/06/24 250 lb (113.4 kg)  02/10/24 250 lb (113.4 kg)  02/02/24 244 lb  14.9 oz (111.1 kg)     GEN: Well nourished, well developed in no acute distress NECK: No JVD; No carotid bruits CARDIAC: ***RRR, no murmurs, rubs, gallops RESPIRATORY:  Clear to auscultation without rales, wheezing or rhonchi  ABDOMEN: Appears non-distended. No obvious abdominal masses. EXTREMITIES: No clubbing or cyanosis. No edema.  Distal pedal pulses are 2+ bilaterally.   Assessment and Plan:   1. Chronic heart failure with mildly reduced ejection fraction (HFmrEF, 41-49%) (HCC) - Most  recent echocardiogram in 01/2024 showed his EF was at 40 to 45%. *** - Continue current medical therapy with Coreg  6.25 mg twice daily, Farxiga  10 mg daily, Entresto  49 to 51 mg twice daily, Spironolactone  25 mg daily and Torsemide  40 mg daily. Creatinine was stable at 1.19 when checked in 03/2024.  2. ICD in place - He does have an Abbott BiV ICD in place which is followed by Dr. Nancey. Interrogation 01/2024 showed normal device function.  3. Persistent atrial fibrillation (HCC) - He recently went underwent atrial flutter ablation by Dr. Nancey in 03/2024. *** Continue Eliquis  5 mg twice daily for anticoagulation which is the appropriate dose given his current age, weight and renal function. CBC on 04/06/2024 shows hemoglobin was stable at 13.7 with platelets at 172 K.   4. Coronary artery disease involving native coronary artery of native heart without angina pectoris -Prior cardiac catheterization in 12/2022 showed mild to moderate nonobstructive disease. *** - He is no longer on ASA given the need for anticoagulation.  Continue Pitavastatin 2 mg daily and Coreg  6.25 mg twice daily.  5. Dyslipidemia - Followed by his PCP.  He has been intolerant to high intensity statin therapy and remains on pitavastatin 2 mg daily.  Was previously on Repatha  but self discontinued due to not tolerating well but unsure of exact reaction.  6. Essential hypertension - BP is at ***today's visit.  Continue current medical therapy with Coreg  6.25 mg twice daily, Entresto  49-51 mg twice daily, Spironolactone  25 mg daily and Torsemide  40 mg daily.    Signed, Noah CHRISTELLA Qua, PA-C

## 2024-05-05 ENCOUNTER — Encounter: Payer: Self-pay | Admitting: Student

## 2024-05-07 ENCOUNTER — Ambulatory Visit (INDEPENDENT_AMBULATORY_CARE_PROVIDER_SITE_OTHER): Payer: Medicare Other

## 2024-05-07 DIAGNOSIS — I447 Left bundle-branch block, unspecified: Secondary | ICD-10-CM | POA: Diagnosis not present

## 2024-05-10 LAB — CUP PACEART REMOTE DEVICE CHECK
Battery Remaining Longevity: 84 mo
Battery Remaining Percentage: 87 %
Battery Voltage: 2.99 V
Brady Statistic AP VP Percent: 25 %
Brady Statistic AP VS Percent: 1 %
Brady Statistic AS VP Percent: 75 %
Brady Statistic AS VS Percent: 1 %
Brady Statistic RA Percent Paced: 25 %
Date Time Interrogation Session: 20250815033659
HighPow Impedance: 86 Ohm
Implantable Lead Connection Status: 753985
Implantable Lead Connection Status: 753985
Implantable Lead Connection Status: 753985
Implantable Lead Implant Date: 20241114
Implantable Lead Implant Date: 20241114
Implantable Lead Implant Date: 20241114
Implantable Lead Location: 753858
Implantable Lead Location: 753859
Implantable Lead Location: 753860
Implantable Pulse Generator Implant Date: 20241114
Lead Channel Impedance Value: 360 Ohm
Lead Channel Impedance Value: 400 Ohm
Lead Channel Impedance Value: 900 Ohm
Lead Channel Pacing Threshold Amplitude: 0.75 V
Lead Channel Pacing Threshold Amplitude: 0.75 V
Lead Channel Pacing Threshold Amplitude: 1 V
Lead Channel Pacing Threshold Pulse Width: 0.5 ms
Lead Channel Pacing Threshold Pulse Width: 0.5 ms
Lead Channel Pacing Threshold Pulse Width: 0.5 ms
Lead Channel Sensing Intrinsic Amplitude: 11.3 mV
Lead Channel Sensing Intrinsic Amplitude: 5 mV
Lead Channel Setting Pacing Amplitude: 1.75 V
Lead Channel Setting Pacing Amplitude: 1.75 V
Lead Channel Setting Pacing Amplitude: 2 V
Lead Channel Setting Pacing Pulse Width: 0.5 ms
Lead Channel Setting Pacing Pulse Width: 0.5 ms
Lead Channel Setting Sensing Sensitivity: 0.5 mV
Pulse Gen Serial Number: 211030161
Zone Setting Status: 755011

## 2024-05-13 ENCOUNTER — Ambulatory Visit: Payer: Self-pay | Admitting: Cardiovascular Disease

## 2024-05-16 NOTE — Progress Notes (Unsigned)
 Electrophysiology Office Note:   Date:  05/17/2024  ID:  Jere Bostrom, DOB 12/23/1950, MRN [id number]  Primary Cardiologist: Vishnu P Mallipeddi, MD Primary Heart Failure: None Electrophysiologist: Eulas FORBES Furbish, MD       History of Present Illness:   Noah Hampton is a 73 y.o. 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  Since last being seen in our clinic the patient reports doing well post ablation. He has not had any further evidence of fast rates.  He states he gets short of breath with exertion.     Hedenies chest pain, palpitations, dyspnea, PND, orthopnea, nausea, vomiting, dizziness, syncope, edema, weight gain, or early satiety.    Review of systems complete and found to be negative unless listed in HPI.   EP Information / Studies Reviewed:    EKG is ordered today. Personal review as below.  EKG Interpretation Date/Time:  Monday May 17 2024 13:30:01 EDT Ventricular Rate:  66 PR Interval:  210 QRS Duration:  126 QT Interval:  430 QTC Calculation: 450 R Axis:   178  Text Interpretation: Atrial-sensed ventricular-paced rhythm with prolonged AV conduction Confirmed by Aniceto Jarvis ([id number]) on 05/17/2024 1:36:42 PM   ICD Interrogation-  reviewed in detail today,  See PACEART report.  Device History: Abbott BiV ICD implanted 08/07/2023 for HFrEF History of appropriate therapy: No History of AAD therapy: No    Arrhythmia / AAD / Pertinent EP Studies AFL EPS 04/06/24 > isthmus-dependent counter clockwise R atrial flutter, s/p RF ablation of CTI    Risk Assessment/Calculations:    CHA2DS2-VASc Score = 5   This indicates a 7.2% annual risk of stroke. The patient's score is based upon: CHF History: 1 HTN History: 1 Diabetes History: 1 Stroke History: 0 Vascular Disease History: 1 Age Score: 1 Gender Score: 0              Physical Exam:   VS:  BP 106/68   Pulse 66   Ht 5' 8 (1.727 m)   Wt 253 lb 6.4 oz (114.9 kg)   SpO2 96%   BMI 38.53 kg/m    Wt Readings from Last 3 Encounters:  05/17/24 253 lb 6.4 oz (114.9 kg)  04/06/24 250 lb (113.4 kg)  02/10/24 250 lb (113.4 kg)     GEN: Well nourished, well developed in no acute distress NECK: No JVD; No carotid bruits CARDIAC: Regular rate and rhythm, no murmurs, rubs, gallops, PPM site without tethering RESPIRATORY:  Clear to auscultation without rales, wheezing or rhonchi  ABDOMEN: Soft, non-tender, non-distended EXTREMITIES:  No edema; No deformity   ASSESSMENT AND PLAN:    Chronic Systolic Dysfunction s/p Abbott CRT-D  LBBB NYHA II-III, LVEF 40-45% 01/2024  -euvolemic by exam / device    -99% BiV pacing > up from before now that he is maintaining SR (was 71%) -Stable on an appropriate medical regimen -Normal ICD function -See Pace Art report -No changes today  Isthmus Dependent R Atrial Flutter  CHA2DS2-VASc 5, s/p RF ablation of CTI on 04/06/24 -OAC for stroke prophylaxis  -EKG with ASVP    Secondary Hypercoagulable State  -continue Eliquis  5mg  BID, dose reviewed and appropriate by age / wt  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 Does not have chest pressure / pain.  -reviewed need for exercise / daily movement -CPAP compliance encouraged    Disposition:   Follow up with Dr. Nancey in 12 months   Signed, Daphne Barrack, NP-C, AGACNP-BC [facility] - Electrophysiology  05/17/2024, 4:25 PM

## 2024-05-17 ENCOUNTER — Encounter: Payer: Self-pay | Admitting: Pulmonary Disease

## 2024-05-17 ENCOUNTER — Ambulatory Visit: Attending: Pulmonary Disease | Admitting: Pulmonary Disease

## 2024-05-17 VITALS — BP 106/68 | HR 66 | Ht 68.0 in | Wt 253.4 lb

## 2024-05-17 DIAGNOSIS — I5020 Unspecified systolic (congestive) heart failure: Secondary | ICD-10-CM | POA: Diagnosis not present

## 2024-05-17 DIAGNOSIS — Z95810 Presence of automatic (implantable) cardiac defibrillator: Secondary | ICD-10-CM | POA: Diagnosis not present

## 2024-05-17 DIAGNOSIS — I447 Left bundle-branch block, unspecified: Secondary | ICD-10-CM

## 2024-05-17 DIAGNOSIS — I4892 Unspecified atrial flutter: Secondary | ICD-10-CM

## 2024-05-17 DIAGNOSIS — D6869 Other thrombophilia: Secondary | ICD-10-CM

## 2024-05-17 LAB — CUP PACEART INCLINIC DEVICE CHECK
Date Time Interrogation Session: 20250825162618
Implantable Lead Connection Status: 753985
Implantable Lead Connection Status: 753985
Implantable Lead Connection Status: 753985
Implantable Lead Implant Date: 20241114
Implantable Lead Implant Date: 20241114
Implantable Lead Implant Date: 20241114
Implantable Lead Location: 753858
Implantable Lead Location: 753859
Implantable Lead Location: 753860
Implantable Pulse Generator Implant Date: 20241114
Pulse Gen Serial Number: 211030161

## 2024-05-17 NOTE — Patient Instructions (Signed)
 Medication Instructions:  Your physician recommends that you continue on your current medications as directed. Please refer to the Current Medication list given to you today.  *If you need a refill on your cardiac medications before your next appointment, please call your pharmacy*  Lab Work: None ordered If you have labs (blood work) drawn today and your tests are completely normal, you will receive your results only by: MyChart Message (if you have MyChart) OR A paper copy in the mail If you have any lab test that is abnormal or we need to change your treatment, we will call you to review the results.  Follow-Up: At [facility], you and your health needs are our priority.  As part of our continuing mission to provide you with exceptional heart care, our providers are all part of one team.  This team includes your primary Cardiologist (physician) and Advanced Practice Providers or APPs (Physician Assistants and Nurse Practitioners) who all work together to provide you with the care you need, when you need it.  Your next appointment:   1 year(s)  Provider:   Eulas Furbish, MD or Daphne Barrack, NP

## 2024-05-27 ENCOUNTER — Ambulatory Visit: Payer: Self-pay | Admitting: Cardiovascular Disease

## 2024-05-31 ENCOUNTER — Encounter (HOSPITAL_COMMUNITY): Payer: Self-pay

## 2024-05-31 ENCOUNTER — Emergency Department (HOSPITAL_COMMUNITY)

## 2024-05-31 ENCOUNTER — Other Ambulatory Visit: Payer: Self-pay

## 2024-05-31 ENCOUNTER — Emergency Department (HOSPITAL_COMMUNITY)
Admission: EM | Admit: 2024-05-31 | Discharge: 2024-05-31 | Disposition: A | Discharge status: Home / Self Care | Source: Ambulatory Visit | Attending: Emergency Medicine | Admitting: Emergency Medicine

## 2024-05-31 DIAGNOSIS — Z7901 Long term (current) use of anticoagulants: Secondary | ICD-10-CM | POA: Insufficient documentation

## 2024-05-31 DIAGNOSIS — M25572 Pain in left ankle and joints of left foot: Secondary | ICD-10-CM | POA: Diagnosis present

## 2024-05-31 DIAGNOSIS — Z86718 Personal history of other venous thrombosis and embolism: Secondary | ICD-10-CM | POA: Diagnosis not present

## 2024-05-31 MED ORDER — DICLOFENAC SODIUM 1.6 % EX GEL: 1.0000 | Freq: Three times a day (TID) | CUTANEOUS | 0 refills | Status: AC

## 2024-05-31 NOTE — ED Provider Notes (Signed)
 [place] EMERGENCY DEPARTMENT AT [facility] Provider Note   CSN: [id number] Arrival date & time: 05/31/24  1528     Patient presents with: Ankle Pain   Noah Hampton is a 73 y.o. male.   HPI Patient was sent for evaluation from outpatient Ortho where he had presented as a walk-in patient for left ankle pain. He has history of gout, as well as DVT.  He notes that he is recently developed pain in the left lateral proximal ankle.  No obvious precipitant, though the patient notes that he is undergoing evaluation for flatfeet contributing to ongoing pain bilateral calcaneus. With his history of DVT he was sent here from orthopedics for evaluation. No other systemic complaints.     Prior to Admission medications   Medication Sig Start Date End Date Taking? Authorizing Provider  Diclofenac  Sodium 1.6 % GEL Apply 1 applicator topically in the morning, at noon, and at bedtime. 05/31/24  Yes Garrick Charleston, MD  acetaminophen  (TYLENOL ) 500 MG tablet Take 1,000 mg by mouth every 6 (six) hours as needed for moderate pain (pain score 4-6).    [provider]  apixaban  (ELIQUIS ) 5 MG TABS tablet Take 1 tablet (5 mg total) by mouth 2 (two) times daily. 01/08/24   Strader, Laymon HERO, PA-C  Bempedoic Acid-Ezetimibe (NEXLIZET) 180-10 MG TABS Take 1 tablet by mouth daily. 03/09/24   [provider]  cabergoline (DOSTINEX) 0.5 MG tablet Take 0.5 mg by mouth 2 (two) times a week. Mon & Fri 03/11/24   [provider]  carvedilol  (COREG ) 6.25 MG tablet Take 1 tablet (6.25 mg total) by mouth 2 (two) times daily with a meal. 11/15/22 01/30/89  Maree, Pratik D, DO  clonazePAM  (KLONOPIN ) 0.5 MG tablet Take 0.5 mg by mouth 2 (two) times daily. 06/19/23   [provider]  colchicine  0.6 MG tablet Take 0.6 mg by mouth daily as needed (Gout).    [provider]  cyanocobalamin (VITAMIN B12) 1000 MCG tablet Take 1,000 mcg by mouth daily.    [provider]   escitalopram  (LEXAPRO ) 10 MG tablet Take 10 mg by mouth daily. 02/12/22   [provider]  FARXIGA  10 MG TABS tablet Take 10 mg by mouth daily. 02/12/22   [provider]  Menthol -Camphor (TIGER BALM ARTHRITIS RUB EX) Apply 1 Application topically daily as needed (pain).    [provider]  sacubitril -valsartan  (ENTRESTO ) 49-51 MG Take 1 tablet by mouth 2 (two) times daily. 01/08/24   Strader, Laymon HERO, PA-C  spironolactone  (ALDACTONE ) 25 MG tablet Take 1 tablet (25 mg total) by mouth daily. 02/03/23   Mallipeddi, Vishnu P, MD  torsemide  (DEMADEX ) 20 MG tablet Take 3 tablets (60 mg total) by mouth daily. Patient taking differently: Take 40 mg by mouth daily. 01/08/24   Johnson, Laymon HERO, PA-C    Allergies: Statins and Repatha  [evolocumab ]    Review of Systems  Updated Vital Signs BP 115/70 (BP Location: Right Arm)   Pulse 65   Temp 98.1 F (36.7 C) (Oral)   Resp 17   Ht 1.727 m (5' 8)   Wt 115 kg   SpO2 99%   BMI 38.55 kg/m   Physical Exam Vitals and nursing note reviewed.  Constitutional:      General: He is not in acute distress.    Appearance: He is well-developed. He is obese.  HENT:     Head: Normocephalic and atraumatic.  Eyes:     Conjunctiva/sclera: Conjunctivae normal.  Cardiovascular:     Rate and Rhythm: Normal rate and regular rhythm.  Pulmonary:     Effort: Pulmonary effort is normal. No respiratory distress.     Breath sounds: No stridor.  Abdominal:     General: There is no distension.  Musculoskeletal:       Legs:  Skin:    General: Skin is warm and dry.  Neurological:     Mental Status: He is alert and oriented to person, place, and time.     (all labs ordered are listed, but only abnormal results are displayed) Labs Reviewed - No data to display  EKG: None  Radiology: US  Venous Img Lower  Left (DVT Study) Result Date: 05/31/2024 CLINICAL DATA:  Left lower extremity edema EXAM: LEFT LOWER EXTREMITY VENOUS DOPPLER  ULTRASOUND TECHNIQUE: Gray-scale sonography with compression, as well as color and duplex ultrasound, were performed to evaluate the deep venous system(s) from the level of the common femoral vein through the popliteal and proximal calf veins. COMPARISON:  None Available. FINDINGS: VENOUS Normal compressibility of the common femoral, superficial femoral, and popliteal veins, as well as the visualized calf veins. Visualized portions of profunda femoral vein and great saphenous vein unremarkable. No filling defects to suggest DVT on grayscale or color Doppler imaging. Doppler waveforms show normal direction of venous flow, normal respiratory plasticity and response to augmentation. Limited views of the contralateral common femoral vein are unremarkable. OTHER None. Limitations: none IMPRESSION: Negative. Electronically Signed   By: Wilkie Lent M.D.   On: 05/31/2024 16:19     Procedures   Medications Ordered in the ED - No data to display                                   Patient with x-ray performed earlier today reportedly normal.   Medical Decision Making Patient presents with ongoing ankle pain.  Patient is awake, alert, afebrile and has no superficial changes, no fever suggesting cellulitis, no erythema suggesting gout, he is distally neurovascular intact.  With tenderness about the proximal ankle some suspicion for soft tissue injury.  Patient with negative ultrasound result which I reviewed, discussed with him and his family member at bedside. Patient will follow-up with orthopedics and podiatry.  Amount and/or Complexity of Data Reviewed Independent Historian: friend  Risk Prescription drug management.   5:15 PM Patient in no distress on repeat exam, we discussed follow-up, brace for comfort, orthopedic/podiatry appointments, and appropriate therapy.     Final diagnoses:  Acute left ankle pain    ED Discharge Orders          Ordered    Diclofenac  Sodium 1.6 % GEL  3  times daily        05/31/24 1707               Garrick Charleston, MD 05/31/24 1715

## 2024-05-31 NOTE — ED Triage Notes (Signed)
 Pt arrived via POV c/o left ankle pain that began last night. Pt reports going to [facility] P[facility] and being advised to come to [facility] to rule out possible blood clot. Pt denies injury.

## 2024-05-31 NOTE — Discharge Instructions (Signed)
 Today's evaluation has been reassuring.  There is no evidence for a blood clot in your leg.  Please be sure to follow-up with the orthopedic team, and if needed with our podiatry colleagues.

## 2024-06-04 ENCOUNTER — Other Ambulatory Visit (HOSPITAL_COMMUNITY): Payer: Self-pay | Admitting: Orthopaedic Surgery

## 2024-06-04 DIAGNOSIS — M25572 Pain in left ankle and joints of left foot: Secondary | ICD-10-CM

## 2024-06-07 ENCOUNTER — Ambulatory Visit (HOSPITAL_COMMUNITY)
Admission: RE | Admit: 2024-06-07 | Discharge: 2024-06-07 | Disposition: A | Discharge status: Home / Self Care | Source: Ambulatory Visit | Attending: Orthopaedic Surgery | Admitting: Orthopaedic Surgery

## 2024-06-07 ENCOUNTER — Encounter (HOSPITAL_COMMUNITY): Payer: Self-pay

## 2024-06-07 ENCOUNTER — Emergency Department (HOSPITAL_COMMUNITY)
Admission: EM | Admit: 2024-06-07 | Discharge: 2024-06-09 | Disposition: A | Discharge status: Home / Self Care | Attending: Emergency Medicine | Admitting: Emergency Medicine

## 2024-06-07 ENCOUNTER — Other Ambulatory Visit: Payer: Self-pay

## 2024-06-07 DIAGNOSIS — M10072 Idiopathic gout, left ankle and foot: Secondary | ICD-10-CM | POA: Insufficient documentation

## 2024-06-07 DIAGNOSIS — M25572 Pain in left ankle and joints of left foot: Secondary | ICD-10-CM

## 2024-06-07 DIAGNOSIS — M10071 Idiopathic gout, right ankle and foot: Secondary | ICD-10-CM | POA: Diagnosis not present

## 2024-06-07 DIAGNOSIS — I10 Essential (primary) hypertension: Secondary | ICD-10-CM | POA: Insufficient documentation

## 2024-06-07 DIAGNOSIS — N289 Disorder of kidney and ureter, unspecified: Secondary | ICD-10-CM | POA: Diagnosis not present

## 2024-06-07 DIAGNOSIS — D649 Anemia, unspecified: Secondary | ICD-10-CM | POA: Insufficient documentation

## 2024-06-07 DIAGNOSIS — E119 Type 2 diabetes mellitus without complications: Secondary | ICD-10-CM | POA: Insufficient documentation

## 2024-06-07 DIAGNOSIS — Z7901 Long term (current) use of anticoagulants: Secondary | ICD-10-CM | POA: Insufficient documentation

## 2024-06-07 DIAGNOSIS — Z7984 Long term (current) use of oral hypoglycemic drugs: Secondary | ICD-10-CM | POA: Diagnosis not present

## 2024-06-07 DIAGNOSIS — M10079 Idiopathic gout, unspecified ankle and foot: Secondary | ICD-10-CM

## 2024-06-07 DIAGNOSIS — Z79899 Other long term (current) drug therapy: Secondary | ICD-10-CM | POA: Insufficient documentation

## 2024-06-07 DIAGNOSIS — M25571 Pain in right ankle and joints of right foot: Secondary | ICD-10-CM | POA: Diagnosis present

## 2024-06-07 LAB — COMPREHENSIVE METABOLIC PANEL WITH GFR
ALT: 17 U/L (ref 0–44)
AST: 15 U/L (ref 15–41)
Albumin: 3.1 g/dL — ABNORMAL LOW (ref 3.5–5.0)
Alkaline Phosphatase: 59 U/L (ref 38–126)
Anion gap: 14 (ref 5–15)
BUN: 28 mg/dL — ABNORMAL HIGH (ref 8–23)
CO2: 21 mmol/L — ABNORMAL LOW (ref 22–32)
Calcium: 10.1 mg/dL (ref 8.9–10.3)
Chloride: 104 mmol/L (ref 98–111)
Creatinine, Ser: 1.33 mg/dL — ABNORMAL HIGH (ref 0.61–1.24)
GFR, Estimated: 56 mL/min — ABNORMAL LOW (ref >60)
Glucose, Bld: 276 mg/dL — ABNORMAL HIGH (ref 70–99)
Potassium: 4.3 mmol/L (ref 3.5–5.1)
Sodium: 139 mmol/L (ref 135–145)
Total Bilirubin: 0.2 mg/dL (ref 0.0–1.2)
Total Protein: 8.2 g/dL — ABNORMAL HIGH (ref 6.5–8.1)

## 2024-06-07 LAB — CBC WITH DIFFERENTIAL/PLATELET
Abs Immature Granulocytes: 0.04 K/uL (ref 0.00–0.07)
Basophils Absolute: 0.1 K/uL (ref 0.0–0.1)
Basophils Relative: 1 %
Eosinophils Absolute: 0.1 K/uL (ref 0.0–0.5)
Eosinophils Relative: 1 %
HCT: 40.1 % (ref 39.0–52.0)
Hemoglobin: 12.7 g/dL — ABNORMAL LOW (ref 13.0–17.0)
Immature Granulocytes: 0 %
Lymphocytes Relative: 11 %
Lymphs Abs: 1.3 K/uL (ref 0.7–4.0)
MCH: 30.5 pg (ref 26.0–34.0)
MCHC: 31.7 g/dL (ref 30.0–36.0)
MCV: 96.2 fL (ref 80.0–100.0)
Monocytes Absolute: 0.9 K/uL (ref 0.1–1.0)
Monocytes Relative: 8 %
Neutro Abs: 9.1 K/uL — ABNORMAL HIGH (ref 1.7–7.7)
Neutrophils Relative %: 79 %
Platelets: 242 K/uL (ref 150–400)
RBC: 4.17 MIL/uL — ABNORMAL LOW (ref 4.22–5.81)
RDW: 12.9 % (ref 11.5–15.5)
WBC: 11.5 K/uL — ABNORMAL HIGH (ref 4.0–10.5)
nRBC: 0 % (ref 0.0–0.2)

## 2024-06-07 LAB — URIC ACID: Uric Acid, Serum: 9.4 mg/dL — ABNORMAL HIGH (ref 3.7–8.6)

## 2024-06-07 NOTE — ED Triage Notes (Signed)
 Pt c/o bilateral ankle pain, was sent for MRI by PCP but pt would like both legs MRI'd. Per pt's wife, she is not able to help pt. Pt went to MRI and was sent back d/t pacemaker. Pt states legs were checked for blood clots previously.

## 2024-06-07 NOTE — ED Triage Notes (Signed)
 Pt to ED from doctor's office. Pt sent for MRI. Pt c/o bilateral ankle swelling and pain.

## 2024-06-07 NOTE — ED Notes (Addendum)
 QTN: Wife came to the desk. Patient was needing another lab draw but no one called him to redraw the labs. Patient was sent to triage to have labs redrawn.

## 2024-06-07 NOTE — ED Provider Triage Note (Signed)
 Emergency Medicine Provider Triage Evaluation Note  Noah Hampton , a 73 y.o. male  was evaluated in triage.  Pt complains of ankle pain.  Patient reportedly was sent in by orthopedics for MRI imaging of the left ankle.  He states that he was supposed to have this done outpatient but appears that there was some issue getting this done today.  He is in Emergency Department questing MRI imaging done.  Endorses ongoing issues of weightbearing and pain due to amount of swelling that he feels in bilateral ankles and feet.  He has a history of gout but has not been on any treatment for gout recently.  Had negative DVT study performed earlier last week  Review of Systems  Positive: As above Negative: As above  Physical Exam  BP 135/80   Pulse 84   Temp 98 F (36.7 C)   Resp 18   Ht 5' 8 (1.727 m)   Wt 113.4 kg   SpO2 98%   BMI 38.01 kg/m  Gen:   Awake, no distress  Resp:  Normal effort  MSK:   Pain to palpation of the left foot and ankle. Swelling present. Other:    Medical Decision Making  Medically screening exam initiated at 5:37 PM.  Appropriate orders placed.  Noah Hampton was informed that the remainder of the evaluation will be completed by another provider, this initial triage assessment does not replace that evaluation, and the importance of remaining in the ED until their evaluation is complete.    Noah Hampton A, PA-C 06/07/24 1739

## 2024-06-07 NOTE — ED Notes (Signed)
 MRI states they are unable to do this MRI tonight due to his Pacemaker and it will have to be done in the morning.

## 2024-06-08 ENCOUNTER — Other Ambulatory Visit: Payer: Self-pay

## 2024-06-08 MED ORDER — CABERGOLINE 0.5 MG PO TABS: 0.5000 mg | ORAL_TABLET | ORAL | Status: DC

## 2024-06-08 MED ORDER — CLONAZEPAM 0.5 MG PO TABS
0.5000 mg | ORAL_TABLET | Freq: Two times a day (BID) | ORAL | Status: DC
  Administered 2024-06-08 – 2024-06-09 (×2): 0.5 mg via ORAL
  Filled 2024-06-08 (×2): qty 1

## 2024-06-08 MED ORDER — MUSCLE RUB 10-15 % EX CREA: TOPICAL_CREAM | Freq: Every day | CUTANEOUS | Status: DC | PRN

## 2024-06-08 MED ORDER — COLCHICINE 0.6 MG PO TABS
0.6000 mg | ORAL_TABLET | Freq: Every day | ORAL | Status: DC
  Administered 2024-06-08 – 2024-06-09 (×2): 0.6 mg via ORAL
  Filled 2024-06-08 (×2): qty 1

## 2024-06-08 MED ORDER — OXYCODONE-ACETAMINOPHEN 5-325 MG PO TABS
1.0000 | ORAL_TABLET | Freq: Once | ORAL | Status: AC
  Administered 2024-06-08: 1 via ORAL
  Filled 2024-06-08: qty 1

## 2024-06-08 MED ORDER — DICLOFENAC SODIUM 1.6 % EX GEL: 1.0000 | Freq: Three times a day (TID) | CUTANEOUS | Status: DC

## 2024-06-08 MED ORDER — SPIRONOLACTONE 12.5 MG HALF TABLET
25.0000 mg | ORAL_TABLET | Freq: Every day | ORAL | Status: DC
  Administered 2024-06-08 – 2024-06-09 (×2): 25 mg via ORAL
  Filled 2024-06-08 (×2): qty 1

## 2024-06-08 MED ORDER — COLCHICINE 0.6 MG PO TABS: ORAL_TABLET | ORAL | 0 refills | Status: AC

## 2024-06-08 MED ORDER — OXYCODONE HCL 5 MG PO TABS: 5.0000 mg | ORAL_TABLET | ORAL | 0 refills | Status: AC | PRN

## 2024-06-08 MED ORDER — APIXABAN 5 MG PO TABS
5.0000 mg | ORAL_TABLET | Freq: Two times a day (BID) | ORAL | Status: DC
  Administered 2024-06-09 (×2): 5 mg via ORAL
  Filled 2024-06-08 (×2): qty 1

## 2024-06-08 MED ORDER — CARVEDILOL 3.125 MG PO TABS
6.2500 mg | ORAL_TABLET | Freq: Two times a day (BID) | ORAL | Status: DC
  Administered 2024-06-09: 6.25 mg via ORAL
  Filled 2024-06-08: qty 2

## 2024-06-08 MED ORDER — COLCHICINE 0.6 MG PO TABS
0.6000 mg | ORAL_TABLET | Freq: Once | ORAL | Status: AC
  Administered 2024-06-08: 0.6 mg via ORAL
  Filled 2024-06-08: qty 1

## 2024-06-08 MED ORDER — DAPAGLIFLOZIN PROPANEDIOL 10 MG PO TABS
10.0000 mg | ORAL_TABLET | Freq: Every day | ORAL | Status: DC
  Administered 2024-06-08 – 2024-06-09 (×2): 10 mg via ORAL
  Filled 2024-06-08 (×2): qty 1

## 2024-06-08 MED ORDER — BEMPEDOIC ACID-EZETIMIBE 180-10 MG PO TABS
1.0000 | ORAL_TABLET | Freq: Every day | ORAL | Status: DC
Start: 2024-06-08 — End: 2024-06-09

## 2024-06-08 MED ORDER — ESCITALOPRAM OXALATE 10 MG PO TABS
10.0000 mg | ORAL_TABLET | Freq: Every day | ORAL | Status: DC
  Administered 2024-06-08 – 2024-06-09 (×2): 10 mg via ORAL
  Filled 2024-06-08 (×2): qty 1

## 2024-06-08 MED ORDER — PREDNISONE 20 MG PO TABS
60.0000 mg | ORAL_TABLET | Freq: Once | ORAL | Status: AC
  Administered 2024-06-08: 60 mg via ORAL
  Filled 2024-06-08: qty 3

## 2024-06-08 MED ORDER — TORSEMIDE 20 MG PO TABS
40.0000 mg | ORAL_TABLET | Freq: Every day | ORAL | Status: DC
  Administered 2024-06-08 – 2024-06-09 (×2): 40 mg via ORAL
  Filled 2024-06-08 (×2): qty 2

## 2024-06-08 MED ORDER — ACETAMINOPHEN 500 MG PO TABS
1000.0000 mg | ORAL_TABLET | Freq: Four times a day (QID) | ORAL | Status: DC | PRN
  Administered 2024-06-08: 1000 mg via ORAL
  Filled 2024-06-08: qty 2

## 2024-06-08 MED ORDER — SACUBITRIL-VALSARTAN 49-51 MG PO TABS
1.0000 | ORAL_TABLET | Freq: Two times a day (BID) | ORAL | Status: DC
  Administered 2024-06-08 – 2024-06-09 (×2): 1 via ORAL
  Filled 2024-06-08 (×2): qty 1

## 2024-06-08 MED ORDER — VITAMIN B-12 1000 MCG PO TABS
1000.0000 ug | ORAL_TABLET | Freq: Every day | ORAL | Status: DC
  Administered 2024-06-08 – 2024-06-09 (×2): 1000 ug via ORAL
  Filled 2024-06-08 (×3): qty 1

## 2024-06-08 MED ORDER — PREDNISONE 50 MG PO TABS: 50.0000 mg | ORAL_TABLET | Freq: Every day | ORAL | 0 refills | Status: AC

## 2024-06-08 NOTE — Care Management (Addendum)
 Patient having difficulty walking awaiting PT eval.  Discussed next level of care options with the patient, including Home Health [facility]) versus Skilled Nursing Facility (SNF). The patient expressed a preference to discharge home with [facility] services at this time. Explained the [facility] services and referral process, he is agreeable.DME r/w also ordered. Patient requested [facility] in [place], [place]. Information placed on AVS. HH Referral will be faxed to [facility] at [phone]. Updated EDP and RN on Yellow [facility].

## 2024-06-08 NOTE — Evaluation (Signed)
 Physical Therapy Evaluation Patient Details Name: Noah Hampton MRN: [id number] DOB: 07-08-1951 Today's Date: 06/08/2024  History of Present Illness  73 y.o. male presents to [facility] hospital on 06/07/2024 with ongoing ankle pain. PMH includes HTN, DM, HLD, gout, GERD.  Clinical Impression  Pt presents to PT with deficits in functional mobility, gait, balance, strength, power, endurance. Pt requires significant physical assistance to transfer between surfaces due to BLE weakness. Pt is unable to ambulate at this time due to weakness and fatigue. Pt will benefit from frequent attempts at mobilization in an effort to improve LE strength. Patient will benefit from continued inpatient follow up therapy, <3 hours/day.        If plan is discharge home, recommend the following: A lot of help with walking and/or transfers;A lot of help with bathing/dressing/bathroom;Assistance with cooking/housework;Assist for transportation;Help with stairs or ramp for entrance   Can travel by private vehicle   No    Equipment Recommendations Wheelchair (measurements PT);Wheelchair cushion (measurements PT)  Recommendations for Other Services       Functional Status Assessment Patient has had a recent decline in their functional status and demonstrates the ability to make significant improvements in function in a reasonable and predictable amount of time.     Precautions / Restrictions Precautions Precautions: Fall Recall of Precautions/Restrictions: Intact Restrictions Weight Bearing Restrictions Per Provider Order: No      Mobility  Bed Mobility Overal bed mobility: Needs Assistance Bed Mobility: Rolling, Sidelying to Sit, Sit to Supine Rolling: Min assist Sidelying to sit: Min assist, HOB elevated, Used rails   Sit to supine: Min assist        Transfers Overall transfer level: Needs assistance Equipment used: 1 person hand held assist, Rolling walker (2 wheels) Transfers: Sit to/from Stand, Bed  to chair/wheelchair/BSC Sit to Stand: Min assist, From elevated surface   Step pivot transfers: Mod assist, From elevated surface       General transfer comment: pt stands once with RW and later performs 2 face to face SPT with PT assistance    Ambulation/Gait                  Stairs            Wheelchair Mobility     Tilt Bed    Modified Rankin (Stroke Patients Only)       Balance Overall balance assessment: Needs assistance Sitting-balance support: No upper extremity supported, Feet supported Sitting balance-Leahy Scale: Fair     Standing balance support: Bilateral upper extremity supported, Reliant on assistive device for balance Standing balance-Leahy Scale: Poor                               Pertinent Vitals/Pain Pain Assessment Pain Assessment: Faces Faces Pain Scale: Hurts even more Pain Location: ankles Pain Descriptors / Indicators: Sore Pain Intervention(s): Monitored during session    Home Living Family/patient expects to be discharged to:: Private residence Living Arrangements: Spouse/significant other;Other relatives Available Help at Discharge: Family;Available 24 hours/day Type of Home: House Home Access: Stairs to enter Entrance Stairs-Rails: None Entrance Stairs-Number of Steps: 1   Home Layout: Two level;Able to live on main level with bedroom/bathroom Home Equipment: Rollator (4 wheels);Cane - single point;BSC/3in1      Prior Function Prior Level of Function : Needs assist             Mobility Comments: ambulatory without DME recently, pt reports progressing to  only PRN use of SPC ADLs Comments: assistance from spouse     Extremity/Trunk Assessment   Upper Extremity Assessment Upper Extremity Assessment: Generalized weakness    Lower Extremity Assessment Lower Extremity Assessment: Generalized weakness    Cervical / Trunk Assessment Cervical / Trunk Assessment: Kyphotic;Other exceptions Cervical  / Trunk Exceptions: body habitus  Communication   Communication Communication: No apparent difficulties    Cognition Arousal: Alert Behavior During Therapy: WFL for tasks assessed/performed   PT - Cognitive impairments: Safety/Judgement                         Following commands: Intact       Cueing Cueing Techniques: Verbal cues     General Comments General comments (skin integrity, edema, etc.): VSS on RA    Exercises     Assessment/Plan    PT Assessment Patient needs continued PT services  PT Problem List Decreased strength;Decreased activity tolerance;Decreased balance;Decreased mobility;Decreased knowledge of use of DME;Decreased safety awareness;Decreased knowledge of precautions;Pain       PT Treatment Interventions DME instruction;Gait training;Functional mobility training;Therapeutic activities;Therapeutic exercise;Balance training;Neuromuscular re-education;Stair training;Patient/family education;Wheelchair mobility training    PT Goals (Current goals can be found in the Care Plan section)  Acute Rehab PT Goals Patient Stated Goal: to return to prior level of function, ambulate PT Goal Formulation: With patient/family Time For Goal Achievement: 06/22/24 Potential to Achieve Goals: Fair    Frequency Min 2X/week     Co-evaluation               AM-PAC PT 6 Clicks Mobility  Outcome Measure Help needed turning from your back to your side while in a flat bed without using bedrails?: A Little Help needed moving from lying on your back to sitting on the side of a flat bed without using bedrails?: A Little Help needed moving to and from a bed to a chair (including a wheelchair)?: A Lot Help needed standing up from a chair using your arms (e.g., wheelchair or bedside chair)?: A Lot Help needed to walk in hospital room?: Total Help needed climbing 3-5 steps with a railing? : Total 6 Click Score: 12    End of Session Equipment Utilized During  Treatment: Gait belt Activity Tolerance: Patient limited by fatigue Patient left: in bed;with call bell/phone within reach;with family/visitor present Nurse Communication: Mobility status PT Visit Diagnosis: Other abnormalities of gait and mobility (R26.89);Muscle weakness (generalized) (M62.81);Pain Pain - part of body: Ankle and joints of foot    Time: 1252-1340 PT Time Calculation (min) (ACUTE ONLY): 48 min   Charges:   PT Evaluation $PT Eval Low Complexity: 1 Low PT Treatments $Therapeutic Activity: 8-22 mins PT General Charges $$ ACUTE PT VISIT: 1 Visit         Bernardino JINNY Ruth, PT, DPT Acute Rehabilitation Office [phone]   Bernardino JINNY Ruth 06/08/2024, 1:56 PM

## 2024-06-08 NOTE — Care Management (Signed)
    Durable Medical Equipment  (From admission, onward)           Start     Ordered   06/08/24 1154  For home use only DME Walker rolling  Once       Question Answer Comment  Walker: With 5 Inch Wheels   Patient needs a walker to treat with the following condition Unsteady gait      06/08/24 1154

## 2024-06-08 NOTE — Progress Notes (Addendum)
 CSW notified by PT that pt is agreeable to SNF now. CSW called pt on room phone; he confirms he is agreeable to short term rehab at [facility]. He would prefer a facility in [place] where he lives. Fl2 completed and bed requests sent in hub. CSW also faxed referral to [facility] [phone]  1555: CSW met with pt and provided SNF bed offer list with medicare star ratings. Pt explains he would like [facility] which is not on the list. Their referral is pending. CSW called and left voicemail with Skyler in admissions.

## 2024-06-08 NOTE — ED Provider Notes (Signed)
 [place] EMERGENCY DEPARTMENT AT [facility] Provider Note   CSN: [id number] Arrival date & time: 06/07/24  1439     Patient presents with: Ankle Pain   Noah Hampton is a 73 y.o. male.   The history is provided by the patient.  Ankle Pain  He has history of hypertension, diabetes, hyperlipidemia, GERD, gout and comes in because of ongoing pain in both ankles.  He started having pain in his left ankle about 8 days ago and went to see his orthopedic doctor who thought it was a flareup of gout but sent him to the emergency department to get an ultrasound to rule out DVT.  DVT was ruled out, but he was not started on any medication for his gout.  He continues to have pain in his left ankle and now is starting to have pain in the right ankle.  It has gotten to the point where he is unable to stand because he has pain in both ankles and his wife is unable to care for him.    Prior to Admission medications   Medication Sig Start Date End Date Taking? Authorizing Provider  acetaminophen  (TYLENOL ) 500 MG tablet Take 1,000 mg by mouth every 6 (six) hours as needed for moderate pain (pain score 4-6).    [provider]  apixaban  (ELIQUIS ) 5 MG TABS tablet Take 1 tablet (5 mg total) by mouth 2 (two) times daily. 01/08/24   Strader, Laymon HERO, PA-C  Bempedoic Acid -Ezetimibe  (NEXLIZET) 180-10 MG TABS Take 1 tablet by mouth daily. 03/09/24   [provider]  cabergoline  (DOSTINEX ) 0.5 MG tablet Take 0.5 mg by mouth 2 (two) times a week. Mon & Fri 03/11/24   [provider]  carvedilol  (COREG ) 6.25 MG tablet Take 1 tablet (6.25 mg total) by mouth 2 (two) times daily with a meal. 11/15/22 01/30/89  Maree, Pratik D, DO  clonazePAM  (KLONOPIN ) 0.5 MG tablet Take 0.5 mg by mouth 2 (two) times daily. 06/19/23   [provider]  colchicine  0.6 MG tablet Take 0.6 mg by mouth daily as needed (Gout).    [provider]  cyanocobalamin  (VITAMIN B12) 1000 MCG tablet  Take 1,000 mcg by mouth daily.    [provider]  Diclofenac  Sodium 1.6 % GEL Apply 1 applicator topically in the morning, at noon, and at bedtime. 05/31/24   Garrick Charleston, MD  escitalopram  (LEXAPRO ) 10 MG tablet Take 10 mg by mouth daily. 02/12/22   [provider]  FARXIGA  10 MG TABS tablet Take 10 mg by mouth daily. 02/12/22   [provider]  Menthol -Camphor (TIGER BALM ARTHRITIS RUB EX) Apply 1 Application topically daily as needed (pain).    [provider]  sacubitril -valsartan  (ENTRESTO ) 49-51 MG Take 1 tablet by mouth 2 (two) times daily. 01/08/24   Strader, Laymon HERO, PA-C  spironolactone  (ALDACTONE ) 25 MG tablet Take 1 tablet (25 mg total) by mouth daily. 02/03/23   Mallipeddi, Vishnu P, MD  torsemide  (DEMADEX ) 20 MG tablet Take 3 tablets (60 mg total) by mouth daily. Patient taking differently: Take 40 mg by mouth daily. 01/08/24   Johnson, Laymon HERO, PA-C    Allergies: Statins and Repatha  [evolocumab ]    Review of Systems  All other systems reviewed and are negative.   Updated Vital Signs BP (!) 144/68 (BP Location: Right Arm)   Pulse 66   Temp 98 F (36.7 C)   Resp 20   Ht 5' 8 (1.727 m)   Wt  113.4 kg   SpO2 93%   BMI 38.01 kg/m   Physical Exam Vitals and nursing note reviewed.   73 year old male, resting comfortably and in no acute distress. Vital signs are significant for elevated blood pressure and respiratory rate. Oxygen saturation is 100%, which is normal. Head is normocephalic and atraumatic. PERRLA, EOMI.  Neck is nontender and supple without adenopathy. Lungs are clear without rales, wheezes, or rhonchi. Chest is nontender. Heart has regular rate and rhythm without murmur. Extremities: There is erythema and warmth of both lower legs and ankles-left greater than right.  There is tenderness to palpation over the lateral aspect of both ankles.  Capillary refill is prompt.  There is no calf tenderness. Skin is warm and dry  without rash. Neurologic: Mental status is normal, cranial nerves are intact, moves all extremities equally.  (all labs ordered are listed, but only abnormal results are displayed) Labs Reviewed  CBC WITH DIFFERENTIAL/PLATELET - Abnormal; Notable for the following components:      Result Value   WBC 11.5 (*)    RBC 4.17 (*)    Hemoglobin 12.7 (*)    Neutro Abs 9.1 (*)    All other components within normal limits  COMPREHENSIVE METABOLIC PANEL WITH GFR - Abnormal; Notable for the following components:   CO2 21 (*)    Glucose, Bld 276 (*)    BUN 28 (*)    Creatinine, Ser 1.33 (*)    Total Protein 8.2 (*)    Albumin 3.1 (*)    GFR, Estimated 56 (*)    All other components within normal limits  URIC ACID - Abnormal; Notable for the following components:   Uric Acid, Serum 9.4 (*)    All other components within normal limits  COMPREHENSIVE METABOLIC PANEL WITH GFR     Procedures   Medications Ordered in the ED  predniSONE  (DELTASONE ) tablet 60 mg (60 mg Oral Given 06/08/24 0534)  colchicine  tablet 0.6 mg (0.6 mg Oral Given 06/08/24 0534)  oxyCODONE -acetaminophen  (PERCOCET/ROXICET) 5-325 MG per tablet 1 tablet (1 tablet Oral Given 06/08/24 0534)                                    Medical Decision Making Amount and/or Complexity of Data Reviewed Labs: ordered.  Risk Prescription drug management.   Bilateral ankle pain which appears to be gout based on clinical evaluation.  Doubt cellulitis.  I have reviewed his laboratory tests, and my interpretation is stable renal insufficiency, mild anemia, mild leukocytosis, elevated uric acid level.  MRI had been ordered from triage, but based on clinical appearance and elevated uric acid, I do not feel MRI is needed.  I will treat for clinical gout.  I have ordered a dose of prednisone  and colchicine  and oxycodone -acetaminophen  for pain.  Patient's wife is concerned about inability to care for him at home, I have ordered transitions of care  consult to set up appropriate home health services.  When transitions of care consult is completed, he will be discharged with prescriptions for prednisone , colchicine , oxycodone .     Final diagnoses:  Acute idiopathic gout of ankle, unspecified laterality  Renal insufficiency  Normochromic normocytic anemia    ED Discharge Orders          Ordered    predniSONE  (DELTASONE ) 50 MG tablet  Daily        06/08/24 0547    oxyCODONE  (ROXICODONE )  5 MG immediate release tablet  Every 4 hours PRN        06/08/24 0547    colchicine  0.6 MG tablet        06/08/24 0547               Raford Lenis, MD 06/08/24 [phone]

## 2024-06-08 NOTE — ED Notes (Signed)
 Pt require X 1 assistance with urinal. Removed wet brief and changed bedding

## 2024-06-08 NOTE — ED Notes (Signed)
 Provided pt with tooth brush and water  per request

## 2024-06-08 NOTE — ED Notes (Signed)
 Pt eating lunch independently with set up assistance

## 2024-06-08 NOTE — ED Notes (Signed)
 Pt requesting more gout medications and home medications pt unable to verify or recall any home medications. Provider made aware of requests

## 2024-06-08 NOTE — ED Notes (Signed)
 Phlebotomist in triage made aware that the patient's light green top needs to be recollected.

## 2024-06-08 NOTE — Care Management (Signed)
 Patient evaluated by PT with recommendation s for SNF patient is now agreeable level of care. ED RNCM notified CSW of the change of plan.  EDP is aware.

## 2024-06-08 NOTE — ED Notes (Addendum)
 Called pt daughter per family request for plan of care update. SW made aware of pt daughters request and phone number [phone]

## 2024-06-08 NOTE — ED Provider Notes (Signed)
 Emergency Medicine Observation Re-evaluation Note  Noah Hampton is a 73 y.o. male, seen on rounds today.  Pt initially presented to the ED for complaints of Ankle Pain Currently, the patient is sitting up in bed.  Physical Exam  BP (!) 149/65   Pulse 73   Temp 99 F (37.2 C) (Oral)   Resp 18   Ht 5' 8 (1.727 m)   Wt 113.4 kg   SpO2 91%   BMI 38.01 kg/m  Physical Exam General: No acute distress Cardiac: Normal rate Lungs: No increased work of breathing Psych: Calm  ED Course / MDM  EKG:   I have reviewed the labs performed to date as well as medications administered while in observation.  Recent changes in the last 24 hours include none.  Plan  Current plan is for awaiting TOC input.    Lenor Hollering, MD 06/08/24 [phone]

## 2024-06-08 NOTE — ED Notes (Signed)
 Pt provided with urinal

## 2024-06-08 NOTE — Care Management (Signed)
 Reviewed the patient's chart and noted placement on TOC boarder hold by the Emergency Department Provider. No PT or OT consults noted on chart.Contacted ED physician to request PT orders. Awaiting PT evaluation.Inpatient Care Management (ICM) team will continue to follow for appropriate discharge.   Albert Gosling RN, BSN CNOR ED RN Care Manager [phone]

## 2024-06-08 NOTE — Discharge Instructions (Addendum)
 Once this attack of gout is over, discussed with your primary care provider whether you would benefit from being on medication to prevent future attacks of gout.

## 2024-06-08 NOTE — ED Notes (Signed)
 Pt provided with dinner tray, eating independently

## 2024-06-08 NOTE — Care Management (Signed)
 HH orders re-sent to [facility] in [place].

## 2024-06-08 NOTE — ED Notes (Signed)
 Assisted pt to make phone call home to family

## 2024-06-08 NOTE — [place] FL2 (Signed)
 [place]  MEDICAID FL2 LEVEL OF CARE FORM     IDENTIFICATION  Patient Name: Noah Hampton Birthdate: Nov 08, 1950 Sex: male Admission Date (Current Location): 06/07/2024  [facility] and [place] Number:  Producer, television/film/video and Address:  [facility], [address] [place], [place] [address]      Provider Number: [id number]  Attending Physician Name and Address:  Lenor Hollering, MD  Relative Name and Phone Number:  Arvid, Maren[place] (Spouse)  [phone] (Mobile)    Current Level of Care: Hospital Recommended Level of Care: Skilled Nursing Facility Prior Approval Number:    Date Approved/Denied:   PASRR Number:    Discharge Plan: SNF    Current Diagnoses: Patient Active Problem List   Diagnosis Date Noted   HFrEF (heart failure with reduced ejection fraction) (HCC) 09/19/2023   Acute on chronic HFrEF (heart failure with reduced ejection fraction) (HCC) 09/19/2023   Class 2 obesity 09/19/2023   CHF (congestive heart failure) (HCC) 08/07/2023   Vitamin D deficiency 07/04/2023   Sinus bradycardia 07/04/2023   Metabolic syndrome X 07/04/2023   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   History of urinary stone 07/04/2023   Abnormal weight gain 07/04/2023   Pain of left hip joint 07/01/2023   Incontinence of feces 03/27/2023   ERRONEOUS ENCOUNTER--DISREGARD 03/13/2023   Mixed anxiety and depressive disorder 03/06/2023   Chronic obstructive pulmonary disease (HCC) 02/23/2023   Nonischemic cardiomyopathy (HCC) 01/31/2023   Rash 12/31/2022   Agatston coronary artery calcium  score greater than 400 12/31/2022   Physical deconditioning 11/23/2022   Pneumonia due to infectious organism 11/15/2022   Other hyperlipidemia 11/13/2022   Acute deep vein thrombosis (DVT) of proximal vein of both lower extremities (HCC) 11/13/2022   Acute systolic heart failure (HCC) 11/12/2022   Acute idiopathic gout of right foot 11/12/2022   Acute hypoxic respiratory failure (HCC)  11/12/2022   Myocardial injury 11/12/2022   Acute exacerbation of CHF (congestive heart failure) (HCC) 11/09/2022   Uncontrolled type 2 diabetes mellitus with hyperglycemia, without long-term current use of insulin  (HCC) 11/09/2022   Pneumonitis 11/09/2022   Urinary incontinence 09/29/2022   Nasal congestion 09/29/2022   S/P total knee arthroplasty, 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   Hypertensive urgency 06/08/2022   Dyspnea on exertion 05/30/2022   Insulin  resistance 05/08/2022   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/2022   Pulmonary embolism (HCC) 04/25/2022   Pain in lower limb 03/28/2022   S/P total knee arthroplasty, left 03/19/2022   Low back pain 08/26/2021   Pain in right foot 01/27/2021   Non-alcoholic fatty liver disease 10/24/2020   History of pancreatitis 10/24/2020   Syncope and collapse 10/22/2020   Recurrent major depressive episodes, moderate (HCC) 10/22/2020   Increased frequency of urination 10/22/2020   Dizziness 06/20/2020   Pancreatitis 06/18/2020   Decreased testosterone level 02/10/2020   Right upper quadrant pain 08/03/2019   Osteoarthritis 11/13/2016   Hypoxia 11/13/2016   Chest pain with high risk of acute coronary syndrome 02/02/2016   Primary hypertension 02/02/2016   Type 2 diabetes mellitus, uncontrolled 02/02/2016   Dyslipidemia 02/02/2016   Morbid obesity (HCC) 02/02/2016   OSA (obstructive sleep apnea) 02/02/2016   Chest pain 02/02/2016   Atypical chest pain 02/02/2016    Orientation RESPIRATION BLADDER Height & Weight     Self, Time, Situation, Place  Normal Continent Weight: 250 lb (113.4 kg) Height:  5' 8 (172.7 cm)  BEHAVIORAL SYMPTOMS/MOOD NEUROLOGICAL BOWEL NUTRITION STATUS      Continent Diet (see d/c summary)  AMBULATORY STATUS COMMUNICATION OF NEEDS Skin   Extensive Assist Verbally Normal                       Personal Care Assistance Level of Assistance  Bathing, Feeding, Dressing Bathing Assistance: Maximum  assistance Feeding assistance: Independent Dressing Assistance: Limited assistance     Functional Limitations Info  Sight, Hearing, Speech Sight Info: Adequate Hearing Info: Adequate Speech Info: Adequate    SPECIAL CARE FACTORS FREQUENCY  OT (By licensed OT), PT (By licensed PT)     PT Frequency: 5x/week OT Frequency: 5x/week            Contractures Contractures Info: Not present    Additional Factors Info  Allergies, Code Status Code Status Info: full code Allergies Info: statins, Repatha  (evolocumab )           Current Medications (06/08/2024):  This is the current hospital active medication list No current facility-administered medications for this encounter.   Current Outpatient Medications  Medication Sig Dispense Refill   oxyCODONE  (ROXICODONE ) 5 MG immediate release tablet Take 1 tablet (5 mg total) by mouth every 4 (four) hours as needed for severe pain (pain score 7-10). 20 tablet 0   predniSONE  (DELTASONE ) 50 MG tablet Take 1 tablet (50 mg total) by mouth daily. 5 tablet 0   acetaminophen  (TYLENOL ) 500 MG tablet Take 1,000 mg by mouth every 6 (six) hours as needed for moderate pain (pain score 4-6).     apixaban  (ELIQUIS ) 5 MG TABS tablet Take 1 tablet (5 mg total) by mouth 2 (two) times daily. 180 tablet 3   Bempedoic Acid -Ezetimibe  (NEXLIZET) 180-10 MG TABS Take 1 tablet by mouth daily.     cabergoline  (DOSTINEX ) 0.5 MG tablet Take 0.5 mg by mouth 2 (two) times a week. Mon & Fri     carvedilol  (COREG ) 6.25 MG tablet Take 1 tablet (6.25 mg total) by mouth 2 (two) times daily with a meal. 60 tablet 1   clonazePAM  (KLONOPIN ) 0.5 MG tablet Take 0.5 mg by mouth 2 (two) times daily.     colchicine  0.6 MG tablet Take 1 tablet every other day 15 tablet 0   cyanocobalamin  (VITAMIN B12) 1000 MCG tablet Take 1,000 mcg by mouth daily.     Diclofenac  Sodium 1.6 % GEL Apply 1 applicator topically in the morning, at noon, and at bedtime. 2.5 g 0   escitalopram  (LEXAPRO )  10 MG tablet Take 10 mg by mouth daily.     FARXIGA  10 MG TABS tablet Take 10 mg by mouth daily.     Menthol -Camphor (TIGER BALM ARTHRITIS RUB EX) Apply 1 Application topically daily as needed (pain).     sacubitril -valsartan  (ENTRESTO ) 49-51 MG Take 1 tablet by mouth 2 (two) times daily. 60 tablet 11   spironolactone  (ALDACTONE ) 25 MG tablet Take 1 tablet (25 mg total) by mouth daily. 90 tablet 1   torsemide  (DEMADEX ) 20 MG tablet Take 3 tablets (60 mg total) by mouth daily. (Patient taking differently: Take 40 mg by mouth daily.) 90 tablet 5     Discharge Medications: Please see discharge summary for a list of discharge medications.  Relevant Imaging Results:  Relevant Lab Results:   Additional Information SSN [id number] [place], [place]

## 2024-06-08 NOTE — ED Notes (Signed)
 Provided pt and wife with sandwich

## 2024-06-09 MED ORDER — BEMPEDOIC ACID-EZETIMIBE 180-10 MG PO TABS: 1.0000 | ORAL_TABLET | Freq: Every day | ORAL | Status: DC

## 2024-06-09 NOTE — ED Provider Notes (Signed)
 Notified by nursing that patient is ready for discharge. He has called his son for a ride. He does not desire to stay in the emergency department pending additional resources. He does feel safe going home. Prior physician has already sent prescriptions to the pharmacy. Feel patient is safe for discharge with outpatient follow-up.   Griselda Norris, MD 06/09/24 [phone]

## 2024-06-16 NOTE — Progress Notes (Signed)
Remote ICD Transmission.

## 2024-08-06 ENCOUNTER — Ambulatory Visit (INDEPENDENT_AMBULATORY_CARE_PROVIDER_SITE_OTHER): Payer: Medicare Other

## 2024-08-06 DIAGNOSIS — I447 Left bundle-branch block, unspecified: Secondary | ICD-10-CM | POA: Diagnosis not present

## 2024-08-08 LAB — CUP PACEART REMOTE DEVICE CHECK
Battery Remaining Longevity: 78 mo
Battery Remaining Percentage: 84 %
Battery Voltage: 2.98 V
Brady Statistic AP VP Percent: 17 %
Brady Statistic AP VS Percent: 1 %
Brady Statistic AS VP Percent: 83 %
Brady Statistic AS VS Percent: 1 %
Brady Statistic RA Percent Paced: 17 %
Date Time Interrogation Session: 20251114010105
HighPow Impedance: 72 Ohm
Implantable Lead Connection Status: 753985
Implantable Lead Connection Status: 753985
Implantable Lead Connection Status: 753985
Implantable Lead Implant Date: 20241114
Implantable Lead Implant Date: 20241114
Implantable Lead Implant Date: 20241114
Implantable Lead Location: 753858
Implantable Lead Location: 753859
Implantable Lead Location: 753860
Implantable Pulse Generator Implant Date: 20241114
Lead Channel Impedance Value: 340 Ohm
Lead Channel Impedance Value: 360 Ohm
Lead Channel Impedance Value: 730 Ohm
Lead Channel Pacing Threshold Amplitude: 0.75 V
Lead Channel Pacing Threshold Amplitude: 0.75 V
Lead Channel Pacing Threshold Amplitude: 1 V
Lead Channel Pacing Threshold Pulse Width: 0.5 ms
Lead Channel Pacing Threshold Pulse Width: 0.5 ms
Lead Channel Pacing Threshold Pulse Width: 0.5 ms
Lead Channel Sensing Intrinsic Amplitude: 10.9 mV
Lead Channel Sensing Intrinsic Amplitude: 4.3 mV
Lead Channel Setting Pacing Amplitude: 1.75 V
Lead Channel Setting Pacing Amplitude: 1.75 V
Lead Channel Setting Pacing Amplitude: 2 V
Lead Channel Setting Pacing Pulse Width: 0.5 ms
Lead Channel Setting Pacing Pulse Width: 0.5 ms
Lead Channel Setting Sensing Sensitivity: 0.5 mV
Pulse Gen Serial Number: 211030161
Zone Setting Status: 755011

## 2024-08-10 NOTE — Progress Notes (Signed)
 Remote ICD Transmission

## 2024-08-23 ENCOUNTER — Ambulatory Visit: Payer: Self-pay | Admitting: Cardiovascular Disease
